# Patient Record
Sex: Female | Born: 1995 | Hispanic: Yes | Marital: Single | Smoking: Never smoker
Health system: Southern US, Community
[De-identification: ages and names within clinical notes are randomized; demographics above are authoritative.]

## PROBLEM LIST (undated history)

## (undated) ENCOUNTER — Inpatient Hospital Stay: Payer: Self-pay

## (undated) DIAGNOSIS — K8020 Calculus of gallbladder without cholecystitis without obstruction: Secondary | ICD-10-CM

## (undated) DIAGNOSIS — N390 Urinary tract infection, site not specified: Secondary | ICD-10-CM

## (undated) DIAGNOSIS — D649 Anemia, unspecified: Secondary | ICD-10-CM

## (undated) DIAGNOSIS — R87629 Unspecified abnormal cytological findings in specimens from vagina: Secondary | ICD-10-CM

## (undated) HISTORY — DX: Urinary tract infection, site not specified: N39.0

## (undated) HISTORY — DX: Unspecified abnormal cytological findings in specimens from vagina: R87.629

## (undated) HISTORY — PX: NO PAST SURGERIES: SHX2092

## (undated) HISTORY — PX: CHOLECYSTECTOMY: SHX55

## (undated) HISTORY — DX: Anemia, unspecified: D64.9

---
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
## 2016-01-10 ENCOUNTER — Ambulatory Visit: Payer: Self-pay

## 2016-01-12 ENCOUNTER — Ambulatory Visit
Admission: RE | Admit: 2016-01-12 | Discharge: 2016-01-12 | Disposition: A | Discharge status: Home / Self Care | Payer: Self-pay | Source: Ambulatory Visit | Attending: Family Medicine | Admitting: Family Medicine

## 2016-01-12 DIAGNOSIS — Z3402 Encounter for supervision of normal first pregnancy, second trimester: Secondary | ICD-10-CM | POA: Insufficient documentation

## 2016-01-12 DIAGNOSIS — Z3A18 18 weeks gestation of pregnancy: Secondary | ICD-10-CM | POA: Insufficient documentation

## 2016-04-01 ENCOUNTER — Observation Stay
Admission: EM | Admit: 2016-04-01 | Discharge: 2016-04-01 | Disposition: A | Discharge status: Home / Self Care | Payer: Medicaid Other | Attending: Obstetrics and Gynecology | Admitting: Obstetrics and Gynecology

## 2016-04-01 DIAGNOSIS — M549 Dorsalgia, unspecified: Secondary | ICD-10-CM | POA: Insufficient documentation

## 2016-04-01 DIAGNOSIS — Z3A30 30 weeks gestation of pregnancy: Secondary | ICD-10-CM | POA: Insufficient documentation

## 2016-04-01 DIAGNOSIS — R109 Unspecified abdominal pain: Secondary | ICD-10-CM

## 2016-04-01 DIAGNOSIS — O26893 Other specified pregnancy related conditions, third trimester: Principal | ICD-10-CM | POA: Insufficient documentation

## 2016-04-01 DIAGNOSIS — O26899 Other specified pregnancy related conditions, unspecified trimester: Secondary | ICD-10-CM

## 2016-04-01 LAB — CHLAMYDIA/NGC RT PCR (ARMC ONLY)
CHLAMYDIA TR: NOT DETECTED
N GONORRHOEAE: NOT DETECTED

## 2016-04-01 LAB — URINALYSIS COMPLETE WITH MICROSCOPIC (ARMC ONLY)
BILIRUBIN URINE: NEGATIVE
GLUCOSE, UA: NEGATIVE mg/dL
HGB URINE DIPSTICK: NEGATIVE
KETONES UR: NEGATIVE mg/dL
NITRITE: NEGATIVE
PH: 7 (ref 5.0–8.0)
Protein, ur: NEGATIVE mg/dL
SPECIFIC GRAVITY, URINE: 1.015 (ref 1.005–1.030)

## 2016-04-01 NOTE — Progress Notes (Signed)
Patient ID: Pamela SlickerIris Acuna Villamar, female   DOB: 11/27/1995, 20 y.o.   MRN: [id number] [name] 11/27/1995 G2 P1 1651w4d based on LMP  presents for pelvic pressure and yellow d/c . Possible LOF   , no vaginal bleeding ,pt with LBP . She sustained a fall to her back 30 days ago and it continues to bother her . She has not been seen by [facility] for this . Good daily fetal movt One prior delivery at 37 week ( induced for IUGR )  O;BP 102/60 (BP Location: Left Arm)   Pulse 94   Temp 98.1 F (36.7 C) (Oral)   Resp 18   Ht 4\' 11"  (1.499 m)   Wt 126 lb (57.2 kg)   BMI 25.45 kg/m  ABDsoft NT  CX closed , neg nitrazine  By RN  NST reactive 150 + accels , no decels  Labs: ua  A: lower back pain , M/S injury from one month ago . No evidence of labor  P:d/c home with precautions  Cont fetal kick counts  Pt should make appt with [facility] this week  RTC if worse

## 2016-04-01 NOTE — Discharge Summary (Signed)
  Patient ID: [name], female   DOB: 11/27/1995, 20 y.o.   MRN: [id number] Pamela Cole 11/27/1995 G2 P1 1651w4d based on LMP  presents for pelvic pressure and yellow d/c . Possible LOF   , no vaginal bleeding ,pt with LBP . She sustained a fall to her back 30 days ago and it continues to bother her . She has not been seen by [facility] for this . Good daily fetal movt One prior delivery at 37 week ( induced for IUGR )  O;BP 102/60 (BP Location: Left Arm)   Pulse 94   Temp 98.1 F (36.7 C) (Oral)   Resp 18   Ht 4\' 11"  (1.499 m)   Wt 126 lb (57.2 kg)   BMI 25.45 kg/m  ABDsoft NT  CX closed , neg nitrazine  By RN  NST reactive 150 + accels , no decels  Labs: ua  A: lower back pain , M/S injury from one month ago . No evidence of labor  P:d/c home with precautions  Cont fetal kick counts  Pt should make appt with [facility] this week  RTC if worse

## 2016-04-01 NOTE — OB Triage Note (Signed)
Patient came in for observation for lower abdominal and back pain since Wednesday. Patient rates pain 7 out of 10. Patient denies uterine contractions.  Patient denies leaking of fluid, but complains of yellow vaginal discharge. Patient reports feeling the baby move fine. Vital signs stable and patient afebrile. FHR baseline 145 with moderate variability with accelerations 15 x 15 and no decelerations. Family at bedside. Will continue to monitor.

## 2016-04-03 LAB — URINE CULTURE: CULTURE: NO GROWTH

## 2016-04-07 ENCOUNTER — Other Ambulatory Visit: Payer: Self-pay | Admitting: Advanced Practice Midwife

## 2016-04-07 DIAGNOSIS — Z3483 Encounter for supervision of other normal pregnancy, third trimester: Secondary | ICD-10-CM

## 2016-04-13 ENCOUNTER — Encounter: Payer: Self-pay | Admitting: *Deleted

## 2016-04-13 ENCOUNTER — Other Ambulatory Visit: Payer: Self-pay | Admitting: Advanced Practice Midwife

## 2016-04-13 ENCOUNTER — Ambulatory Visit
Admission: RE | Admit: 2016-04-13 | Discharge: 2016-04-13 | Disposition: A | Discharge status: Home / Self Care | Payer: Self-pay | Source: Ambulatory Visit | Attending: Advanced Practice Midwife | Admitting: Advanced Practice Midwife

## 2016-04-13 ENCOUNTER — Inpatient Hospital Stay
Admission: EM | Admit: 2016-04-13 | Discharge: 2016-04-13 | Disposition: A | Discharge status: Home / Self Care | Payer: Self-pay | Attending: Obstetrics and Gynecology | Admitting: Obstetrics and Gynecology

## 2016-04-13 DIAGNOSIS — Z3A31 31 weeks gestation of pregnancy: Secondary | ICD-10-CM | POA: Insufficient documentation

## 2016-04-13 DIAGNOSIS — O42913 Preterm premature rupture of membranes, unspecified as to length of time between rupture and onset of labor, third trimester: Secondary | ICD-10-CM | POA: Insufficient documentation

## 2016-04-13 DIAGNOSIS — O365931 Maternal care for other known or suspected poor fetal growth, third trimester, fetus 1: Secondary | ICD-10-CM

## 2016-04-13 DIAGNOSIS — Z3483 Encounter for supervision of other normal pregnancy, third trimester: Secondary | ICD-10-CM | POA: Insufficient documentation

## 2016-04-13 LAB — URINALYSIS COMPLETE WITH MICROSCOPIC (ARMC ONLY)
BILIRUBIN URINE: NEGATIVE
GLUCOSE, UA: NEGATIVE mg/dL
HGB URINE DIPSTICK: NEGATIVE
Ketones, ur: NEGATIVE mg/dL
Nitrite: NEGATIVE
PH: 8 (ref 5.0–8.0)
Protein, ur: NEGATIVE mg/dL
SPECIFIC GRAVITY, URINE: 1.012 (ref 1.005–1.030)

## 2016-04-13 LAB — CHLAMYDIA/NGC RT PCR (ARMC ONLY)
CHLAMYDIA TR: NOT DETECTED
N GONORRHOEAE: NOT DETECTED

## 2016-04-13 LAB — WET PREP, GENITAL
Clue Cells Wet Prep HPF POC: NONE SEEN
SPERM: NONE SEEN
TRICH WET PREP: NONE SEEN
Yeast Wet Prep HPF POC: NONE SEEN

## 2016-04-13 NOTE — OB Triage Note (Signed)
Pt discharged in stable condition. Pt aware of follow up appointments to be made in AM. Pt verbalized understanding of discharge instructions.

## 2016-04-13 NOTE — Discharge Instructions (Signed)
fLABOR: When contractions begin, you should start to time them from the beginning of one contraction to the beginning of the next.  When contractions are 5-10 minutes apart or less and have been regular for at least an hour, you should call your health care provider.  Notify your doctor if any of the following occur: 1. Bleeding from the vagina 7. Sudden, constant, or occasional abdominal pain  2. Pain or burning when urinating 8. Sudden gushing of fluid from the vagina (with or without continued leaking)  3. Chills or fever 9. Fainting spells, "black outs" or loss of consciousness  4. Increase in vaginal discharge 10. Severe or continued nausea or vomiting  5. Pelvic pressure (sudden increase) 11. Blurring of vision or spots before the eyes  6. Baby moving less than usual 12. Leaking of fluid    FETAL KICK COUNT: Lie on your left side for one hour after a meal, and count the number of times your baby kicks. If it is less than 5 times, get up, move around and drink some juice. Repeat the test 30 minutes later. If it is still less than 5 kicks in an hour, notify your doctor.

## 2016-04-13 NOTE — Final Progress Note (Signed)
TRIAGE VISIT with NST   Pamela Cole is a 20 y.o. G2P1001. She is at [redacted]w[redacted]d gestation. She was sent from [facility] for a growth scan per patient for S<D. She reports first pregnancy IOL for IUGR at 36-37wks. We do not have prenatal records after 5/17 in the system.  Indication: S<D  S: Resting comfortably. no CTX, no VB. Active fetal movement. Concerned about suprapubic pain. O:  BP 118/66 (BP Location: Left Arm)   Pulse 100   Temp 98.3 F (36.8 C) (Oral)   Resp 17  No results found for this or any previous visit (from the past 48 hour(s)).   Gen: NAD, AAOx3      Abd: FNTTP      Ext: Non-tender, Nonedmeatous    FHT: 140, mod var, +accels no decels TOCO: quiet SVE: Dilation: 1 Effacement (%): Thick Station: -3 Exam by:: Kavaughn Faucett, MD   Ultrasound: OBSTETRICAL ULTRASOUND >14 WKS  FINDINGS: Number of Fetuses: 1  Heart Rate:  135 bpm  Movement: Present  Presentation: Cephalic  Previa: No  Placental Location: Posterior  Amniotic Fluid (Subjective): Decreased.  Amniotic Fluid (Objective):  AFI 7.9 cm (5%ile= 8.6 cm, 95%= 24.2 cm for 32 wks)  FETAL BIOMETRY  BPD:  7.9cm 31w 6d  HC:    28.9cm  31w   6d  AC:   27.5cm  31w   4d  FL:   6.2cm  31w   6d  Current Mean GA: 31w 6d              US EDC: 06/09/2016  Estimated Fetal Weight:  1,829g    24%ile  Maternal Findings:  Cervix:  3.4 cm and closed.    A/P:  20 y.o. G2P1 [redacted]w[redacted]d with S<D, low normal amniotic fluid, leaking white fluid x4 weeks.   Labor: not present. Cervix:  3.4 cm and closed. Occasional contractions on montior, not felt  R/o ROM: SSE negative x 3. Beside wet prep neg for ferning, pseudohyphae, clue cells, trichomoads. Formal Wet prep sent. Normal amount of white discharge. =.   Fetal Wellbeing: Reassuring Cat 1 tracing.  Given normal AFI and reactive NST (see above) modified BPP reassuring.   Plan for repeat AFI in 1 week.  Will need another growth scan in 4 weeks for fetal  growth. Is in 24% today.  Suprapubic pain: UA pending. No vaginal infection. Will test of GC/CT as well.  D/c home stable, precautions reviewed, follow-up as scheduled.    

## 2016-04-13 NOTE — OB Triage Provider Note (Signed)
TRIAGE VISIT with NST   Pamela Cole is a 20 y.o. G2P1001. She is at 2724w3d gestation. She was sent from [facility] for a growth scan per patient for S<D. She reports first pregnancy IOL for IUGR at 36-37wks. We do not have prenatal records after 5/17 in the system.  Indication: S<D  S: Resting comfortably. no CTX, no VB. Active fetal movement. Concerned about suprapubic pain. O:  BP 118/66 (BP Location: Left Arm)   Pulse 100   Temp 98.3 F (36.8 C) (Oral)   Resp 17  No results found for this or any previous visit (from the past 48 hour(s)).   Gen: NAD, AAOx3      Abd: FNTTP      Ext: Non-tender, Nonedmeatous    FHT: 140, mod var, +accels no decels TOCO: quiet SVE: Dilation: 1 Effacement (%): Thick Station: -3 Exam by:: Dalbert GarnetBeasley, MD   Ultrasound: OBSTETRICAL ULTRASOUND >14 WKS  FINDINGS: Number of Fetuses: 1  Heart Rate:  135 bpm  Movement: Present  Presentation: Cephalic  Previa: No  Placental Location: Posterior  Amniotic Fluid (Subjective): Decreased.  Amniotic Fluid (Objective):  AFI 7.9 cm (5%ile= 8.6 cm, 95%= 24.2 cm for 32 wks)  FETAL BIOMETRY  BPD:  7.9cm 31w 6d  HC:    28.9cm  31w   6d  AC:   27.5cm  31w   4d  FL:   6.2cm  31w   6d  Current Mean GA: 31w 6d              US EDC: 06/09/2016  Estimated Fetal Weight:  1,829g    24%ile  Maternal Findings:  Cervix:  3.4 cm and closed.    A/P:  20 y.o. G2P1 9824w3d with S<D, low normal amniotic fluid, leaking white fluid x4 weeks.   Labor: not present. Cervix:  3.4 cm and closed. Occasional contractions on montior, not felt  R/o ROM: SSE negative x 3. Beside wet prep neg for ferning, pseudohyphae, clue cells, trichomoads. Formal Wet prep sent. Normal amount of white discharge. =.   Fetal Wellbeing: Reassuring Cat 1 tracing.  Given normal AFI and reactive NST (see above) modified BPP reassuring.   Plan for repeat AFI in 1 week.  Will need another growth scan in 4 weeks for fetal  growth. Is in 24% today.  Suprapubic pain: UA pending. No vaginal infection. Will test of GC/CT as well.  D/c home stable, precautions reviewed, follow-up as scheduled.

## 2016-04-17 ENCOUNTER — Other Ambulatory Visit (HOSPITAL_COMMUNITY): Payer: Self-pay | Admitting: Family Medicine

## 2016-04-17 ENCOUNTER — Observation Stay
Admission: EM | Admit: 2016-04-17 | Discharge: 2016-04-17 | Disposition: A | Discharge status: Home / Self Care | Payer: Self-pay | Attending: Obstetrics and Gynecology | Admitting: Obstetrics and Gynecology

## 2016-04-17 DIAGNOSIS — Z3483 Encounter for supervision of other normal pregnancy, third trimester: Secondary | ICD-10-CM

## 2016-04-17 DIAGNOSIS — O26893 Other specified pregnancy related conditions, third trimester: Principal | ICD-10-CM | POA: Insufficient documentation

## 2016-04-17 DIAGNOSIS — R102 Pelvic and perineal pain: Secondary | ICD-10-CM | POA: Insufficient documentation

## 2016-04-17 DIAGNOSIS — Z3A32 32 weeks gestation of pregnancy: Secondary | ICD-10-CM | POA: Insufficient documentation

## 2016-04-17 DIAGNOSIS — R05 Cough: Secondary | ICD-10-CM | POA: Insufficient documentation

## 2016-04-17 DIAGNOSIS — O99013 Anemia complicating pregnancy, third trimester: Secondary | ICD-10-CM | POA: Insufficient documentation

## 2016-04-17 LAB — URINALYSIS COMPLETE WITH MICROSCOPIC (ARMC ONLY)
Bacteria, UA: NONE SEEN
Bilirubin Urine: NEGATIVE
GLUCOSE, UA: NEGATIVE mg/dL
Hgb urine dipstick: NEGATIVE
KETONES UR: NEGATIVE mg/dL
Nitrite: NEGATIVE
Protein, ur: NEGATIVE mg/dL
SPECIFIC GRAVITY, URINE: 1.012 (ref 1.005–1.030)
pH: 7 (ref 5.0–8.0)

## 2016-04-17 NOTE — OB Triage Note (Addendum)
Ms. Pamela Cole here with complaint of back pain, lower abdominal pressure and increased discharge. Reports decreased fetal movement, pain that started last night. Denies bleeding.  Pt also reports persistent cough that she states has been present for 3 weeks, lungs clear to ascultation. Pt has not taken anything for cough. Pt reports occasional headaches that are accompanied by blurred vision and seeing spots of light, takes tylenol but reports they usually have to go away on their own.

## 2016-04-17 NOTE — Discharge Summary (Signed)
  Patient ID: [name], female   DOB: February 12, 1996, 20 y.o.   MRN: [id number] Pamela Cole February 12, 1996 G2 P1 1559w0d presents for lower pelvic pressure for 2 weeks . Intermittent . No prior h/o PTL .  noLOF , no vaginal bleeding ,pt with adry cough for 4 weeks . No recent travel . She states she has a neg PPD  O;BP (!) 96/59 (BP Location: Right Arm)   Pulse 100   Temp 98.7 F (37.1 C) (Oral)   Resp 16   SpO2 100%  ABDsoft NT  Lungs CTA  CX ext os opened 1 cm , internal os closed  vtx  NST reactive 150 , no ctx , no decel  Labs: ua pending  A: low back / pressure pressure without evidence of ctx/ ptl  Non productive cough  P:d/c home  Next appt with [facility] tomorrow . Rtc for increasing pelvic pressure  Robitussin DM  ua + culture pending

## 2016-04-17 NOTE — Progress Notes (Signed)
[facility]  Telephone:(336) [phone] Fax:(336) [phone]  ID: [name] OB: 1995/11/09  MR#: [id number]  QIO#:[id number]#:[id number]  Patient Care Team: [facility] as [facility] - General  CHIEF COMPLAINT: Iron deficiency anemia affecting the third trimester of pregnancy.  INTERVAL HISTORY: Patient is a 20 year old female in the third trimester of her second pregnancy who was found to have a significantly decreased hemoglobin on routine blood work. Patient was also noted to have significant iron deficiency anemia with her first pregnancy in 2015. She currently feels well and is asymptomatic. She does not complain of weakness or fatigue. She has no neurologic complaints. She denies any recent fevers or illnesses. She has a good appetite and is gaining weight appropriately. She has no chest pain or shortness of breath. She denies any nausea, vomiting, constipation, or diarrhea. She has no urinary complaints. Patient feels at her baseline and offers no specific complaints today.  REVIEW OF SYSTEMS:   Review of Systems  Constitutional: Negative.  Negative for fever, malaise/fatigue and weight loss.  Respiratory: Negative.  Negative for cough and shortness of breath.   Cardiovascular: Negative.  Negative for leg swelling.  Gastrointestinal: Negative.  Negative for abdominal pain, blood in stool and melena.  Genitourinary: Negative.   Musculoskeletal: Negative.   Neurological: Negative.  Negative for weakness.  Psychiatric/Behavioral: Negative.     As per HPI. Otherwise, a complete review of systems is negative.  PAST MEDICAL HISTORY: Past Medical History:  Diagnosis Date  . Anemia     PAST SURGICAL HISTORY: History reviewed. No pertinent surgical history.  FAMILY HISTORY: Reviewed and unchanged. No reported history of malignancy or chronic disease.  ADVANCED DIRECTIVES (Y/N):  N  HEALTH MAINTENANCE: Social History  Substance Use Topics  . Smoking status:  Never Smoker  . Smokeless tobacco: Never Used  . Alcohol use No     Colonoscopy:  PAP:  Bone density:  Lipid panel:  No Known Allergies  No current outpatient prescriptions on file.   No current facility-administered medications for this visit.     OBJECTIVE: Vitals:   04/18/16 1037  BP: 103/67  Pulse: (!) 111  Temp: (!) 96.4 F (35.8 C)     Body mass index is 26.63 kg/m.    ECOG FS:0 - Asymptomatic  General: Well-developed, well-nourished, no acute distress. Eyes: Pink conjunctiva, anicteric sclera. HEENT: Normocephalic, moist mucous membranes, clear oropharnyx. Lungs: Clear to auscultation bilaterally. Heart: Regular rate and rhythm. No rubs, murmurs, or gallops. Abdomen: Appears appropriate for gestational age.  Musculoskeletal: No edema, cyanosis, or clubbing. Neuro: Alert, answering all questions appropriately. Cranial nerves grossly intact. Skin: No rashes or petechiae noted. Psych: Normal affect. Lymphatics: No cervical, calvicular, axillary or inguinal LAD.   LAB RESULTS:  Lab Results  Component Value Date   NA 135 12/20/2015   K 3.3 (L) 12/20/2015   CL 103 12/20/2015   CO2 23 12/20/2015   GLUCOSE 81 12/20/2015   BUN <5 (L) 12/20/2015   CREATININE 0.37 (L) 12/20/2015   CALCIUM 8.9 12/20/2015   PROT 7.2 12/20/2015   ALBUMIN 3.8 12/20/2015   AST 18 12/20/2015   ALT 12 (L) 12/20/2015   ALKPHOS 60 12/20/2015   BILITOT 0.4 12/20/2015   GFRNONAA >60 12/20/2015   GFRAA >60 12/20/2015    Lab Results  Component Value Date   WBC 11.7 (H) 04/18/2016   NEUTROABS 7.7 (H) 08/21/2013   HGB 8.5 (L) 04/18/2016   HCT 27.2 (L) 04/18/2016   MCV 61.6 (L) 04/18/2016  PLT 284 04/18/2016     STUDIES: US Ob Follow Up  Result Date: 04/13/2016 CLINICAL DATA:  Pregnancy.  Small for dates. EXAM: OBSTETRICAL ULTRASOUND >14 WKS FINDINGS: Number of Fetuses: 1 Heart Rate:  135 bpm Movement: Present Presentation: Cephalic Previa: No Placental Location: Posterior  Amniotic Fluid (Subjective): Decreased. Amniotic Fluid (Objective): AFI 7.9 cm (5%ile= 8.6 cm, 95%= 24.2 cm for 32 wks) FETAL BIOMETRY BPD:  7.9cm 31w 6d HC:    28.9cm  31w   6d AC:   27.5cm  31w   4d FL:   6.2cm  31w   6d Current Mean GA: 31w 6d              [place] EDC: 06/09/2016 Estimated Fetal Weight:  1,829g    24%ile FETAL ANATOMY Lateral Ventricles: Visualized visualized Thalami/CSP: Visualized Posterior Fossa:  Visualized Nuchal Region: Previously visualized Upper Lip: Previously visualized Spine: Visualized 4 Chamber Heart on Left: Visualized LVOT: Previously visualized RVOT: Previously visualized Stomach on Left: Visualized 3 Vessel Cord: Previously visualized Cord Insertion site: Previously visualized Kidneys: Visualized Bladder: Visualized Extremities: Previously visualized Technically difficult due to: Decreased amniotic fluid. Maternal Findings: Cervix:  3.4 cm and closed. IMPRESSION: 1. Decreased amniotic fluid. A process such as premature rupture of membranes cannot be excluded. This report was phoned to the patient's caregiver at the time the study. 2. Single viable intrauterine pregnancy at 31 weeks 6 days in cephalic presentation. Fetal heart rate 135 beats per minute. Clinical correlation suggested. Electronically Signed   By: Maisie Fus  Register   On: 04/13/2016 15:50    ASSESSMENT: Iron deficiency anemia affecting the third trimester of pregnancy.  PLAN:    1.  Iron deficiency anemia affecting the third trimester of pregnancy: Patient's hemoglobin is significantly decreased. Iron stores, B 12, and folate are pending at time of dictation. Given patient's significantly decreased MCV, have also ordered hemoglobin electrophoresis is also pending. Return to clinic in 1 and 2 weeks to receive 510 mg IV Feraheme. Patient will then return to clinic at the end of October 1 week prior to her due date for repeat laboratory work, further evaluation, and consideration of additional IV iron. 2. Pregnancy:  Continue follow-up with OB as scheduled. Patient's due date is June 06, 2016.  Patient expressed understanding and was in agreement with this plan. She also understands that She can call clinic at any time with any questions, concerns, or complaints.   Jeralyn Ruths, MD   04/18/2016 12:15 PM

## 2016-04-18 ENCOUNTER — Other Ambulatory Visit: Payer: Self-pay

## 2016-04-18 ENCOUNTER — Inpatient Hospital Stay: Payer: Self-pay

## 2016-04-18 ENCOUNTER — Inpatient Hospital Stay: Payer: Self-pay | Attending: Oncology | Admitting: Oncology

## 2016-04-18 ENCOUNTER — Encounter: Payer: Self-pay | Admitting: Oncology

## 2016-04-18 VITALS — BP 103/67 | HR 111 | Temp 96.4°F | Wt 131.8 lb

## 2016-04-18 DIAGNOSIS — O99013 Anemia complicating pregnancy, third trimester: Secondary | ICD-10-CM | POA: Insufficient documentation

## 2016-04-18 DIAGNOSIS — Z79899 Other long term (current) drug therapy: Secondary | ICD-10-CM | POA: Insufficient documentation

## 2016-04-18 LAB — CBC
HCT: 27.2 % — ABNORMAL LOW (ref 35.0–47.0)
Hemoglobin: 8.5 g/dL — ABNORMAL LOW (ref 12.0–16.0)
MCH: 19.3 pg — ABNORMAL LOW (ref 26.0–34.0)
MCHC: 31.3 g/dL — ABNORMAL LOW (ref 32.0–36.0)
MCV: 61.6 fL — AB (ref 80.0–100.0)
PLATELETS: 284 10*3/uL (ref 150–440)
RBC: 4.42 MIL/uL (ref 3.80–5.20)
RDW: 17.5 % — AB (ref 11.5–14.5)
WBC: 11.7 10*3/uL — AB (ref 3.6–11.0)

## 2016-04-18 LAB — IRON AND TIBC
IRON: 14 ug/dL — AB (ref 28–170)
SATURATION RATIOS: 2 % — AB (ref 10.4–31.8)
TIBC: 658 ug/dL — AB (ref 250–450)
UIBC: 644 ug/dL

## 2016-04-18 LAB — FOLATE: Folate: 15.6 ng/mL (ref >5.9)

## 2016-04-18 LAB — DAT, POLYSPECIFIC AHG (ARMC ONLY): Polyspecific AHG test: NEGATIVE

## 2016-04-18 LAB — VITAMIN B12: Vitamin B-12: 266 pg/mL (ref 180–914)

## 2016-04-19 LAB — URINE CULTURE
CULTURE: NO GROWTH
SPECIAL REQUESTS: NORMAL

## 2016-04-19 LAB — HEMOGLOBINOPATHY EVALUATION
HGB A: 97.8 % (ref 94.0–98.0)
HGB C: 0 %
HGB F QUANT: 0 % (ref 0.0–2.0)
HGB S QUANTITAION: 0 %
Hgb A2 Quant: 2.2 % (ref 0.7–3.1)

## 2016-04-20 ENCOUNTER — Other Ambulatory Visit: Payer: Self-pay | Admitting: Oncology

## 2016-04-20 ENCOUNTER — Ambulatory Visit
Admission: RE | Admit: 2016-04-20 | Discharge: 2016-04-20 | Disposition: A | Discharge status: Home / Self Care | Payer: Self-pay | Source: Ambulatory Visit | Attending: Family Medicine | Admitting: Family Medicine

## 2016-04-20 ENCOUNTER — Ambulatory Visit: Payer: Self-pay

## 2016-04-20 DIAGNOSIS — Z36 Encounter for antenatal screening of mother: Secondary | ICD-10-CM | POA: Insufficient documentation

## 2016-04-20 DIAGNOSIS — Z3A32 32 weeks gestation of pregnancy: Secondary | ICD-10-CM | POA: Insufficient documentation

## 2016-04-20 DIAGNOSIS — Z3483 Encounter for supervision of other normal pregnancy, third trimester: Secondary | ICD-10-CM | POA: Insufficient documentation

## 2016-04-21 ENCOUNTER — Inpatient Hospital Stay: Payer: Self-pay

## 2016-04-27 ENCOUNTER — Ambulatory Visit: Payer: Self-pay

## 2016-04-28 ENCOUNTER — Inpatient Hospital Stay: Payer: Self-pay

## 2016-04-28 ENCOUNTER — Observation Stay
Admission: EM | Admit: 2016-04-28 | Discharge: 2016-04-28 | Disposition: A | Discharge status: Home / Self Care | Payer: Self-pay | Attending: Obstetrics & Gynecology | Admitting: Obstetrics & Gynecology

## 2016-04-28 DIAGNOSIS — O26893 Other specified pregnancy related conditions, third trimester: Secondary | ICD-10-CM | POA: Insufficient documentation

## 2016-04-28 DIAGNOSIS — Z3A34 34 weeks gestation of pregnancy: Principal | ICD-10-CM | POA: Insufficient documentation

## 2016-04-28 LAB — URINALYSIS COMPLETE WITH MICROSCOPIC (ARMC ONLY)
BACTERIA UA: NONE SEEN
Bilirubin Urine: NEGATIVE
GLUCOSE, UA: NEGATIVE mg/dL
Hgb urine dipstick: NEGATIVE
Ketones, ur: NEGATIVE mg/dL
Nitrite: NEGATIVE
PROTEIN: NEGATIVE mg/dL
SPECIFIC GRAVITY, URINE: 1.016 (ref 1.005–1.030)
pH: 7 (ref 5.0–8.0)

## 2016-04-28 LAB — WET PREP, GENITAL
CLUE CELLS WET PREP: NONE SEEN
Sperm: NONE SEEN
TRICH WET PREP: NONE SEEN
YEAST WET PREP: NONE SEEN

## 2016-04-28 NOTE — OB Triage Note (Signed)
Presents with complaint of lower abd pressure that started this morning and has gotten worse. Pt states some fluid leaking but it has been happening for a couple of months. Denies bleeding. Having some nausea. No vomiting.  Denies any burning with urination but states more frequency.

## 2016-04-28 NOTE — Discharge Summary (Signed)
Pt d/c'd to home in stable condition. D/c instructions given, understanding verbalized.

## 2016-04-28 NOTE — Progress Notes (Signed)
TRIAGE VISIT with NST HPI:  Pamela Cole is a 20 y.o. G2P1. She is at 7559w3d gestation, presenting with c/o of lower abdominal and pelvic pressure.  She was seen today at [facility], and was sent from the office for evaluation.  She also reports sharp vaginal pains and pubic bone pain. She also reports intermittent nausea this week.  She also reports leaking watery fluid for 1 week.  She endorses good fetal movement.  She denies vaginal bleeding, abnormal vaginal discharge, dysuria, c/d, heartburn, HA, visual changes, CP, SOB.  She rates her pain 7/10, and states Tylenol doesn't help.   Prenatal hx:  2015: NSVD - IOL at 37+5 weeks for IUGR <8%  Current Prenatal Active Problems: anemia - hematology consult placed on 04/07/16, persistent proteinuria, hx of UTI, S<D monitoring growth US   04/20/16: AFI 10.1/ vtx/posterior placenta/cervix appears closed 04/13/16: AFI 7.9cm/ vtx/EFW 1829 grams @24 %/cervix 3.4 cm/closed   O:  Temp 98.7 F (37.1 C) (Oral)   LMP 08/31/2015  Results for orders placed or performed during the hospital encounter of 04/28/16 (from the past 48 hour(s))  Urinalysis complete, with microscopic ([facility] only)   Collection Time: 04/28/16  2:54 PM  Result Value Ref Range   Color, Urine YELLOW (A) YELLOW   APPearance CLEAR (A) CLEAR   Glucose, UA NEGATIVE NEGATIVE mg/dL   Bilirubin Urine NEGATIVE NEGATIVE   Ketones, ur NEGATIVE NEGATIVE mg/dL   Specific Gravity, Urine 1.016 1.005 - 1.030   Hgb urine dipstick NEGATIVE NEGATIVE   pH 7.0 5.0 - 8.0   Protein, ur NEGATIVE NEGATIVE mg/dL   Nitrite NEGATIVE NEGATIVE   Leukocytes, UA 1+ (A) NEGATIVE   RBC / HPF 0-5 0 - 5 RBC/hpf   WBC, UA 0-5 0 - 5 WBC/hpf   Bacteria, UA NONE SEEN NONE SEEN   Squamous Epithelial / LPF 0-5 (A) NONE SEEN   Mucous PRESENT   Wet prep, genital   Collection Time: 04/28/16  4:23 PM  Result Value Ref Range   Yeast Wet Prep HPF POC NONE SEEN NONE SEEN   Trich, Wet Prep NONE SEEN NONE SEEN   Clue Cells Wet Prep  HPF POC NONE SEEN NONE SEEN   WBC, Wet Prep HPF POC MANY (A) NONE SEEN   Sperm NONE SEEN     Fern: Negative performed by me  Negative Nitrazine  Gen: NAD, AAOx3      Abd: FNTTP      Ext: Non-tender, Nonedmeatous    NST/FHT: Baseline: 145 bpm/ moderate variability/ +accels/ no decels  TOCO: quiet NWG:[id number]:Dilation: Closed (internal os 1cm.) Exam by:: Tarvares Lant  Fetal head at -1 station   NST: Reactive. See FHT above for particulars.  A/P:  20 y.o. G2P1 3459w3d with lower abdominal and pelvic pressure.   Labor: not present.   R/o ROM: Fern negative. Wet prep sent. Normal amount of white discharge. Nitrazine negative  Pelvic pressure in pregnancy: advised OTC Tylenol, heating pads/ice, belly support band - handouts given, also recommend warm tub soaks or going to an indoor  swimming pool / no hot tubs  Pt. Resting comfortably at time of discharge   Fetal Wellbeing: NST reactive Reassuring Cat 1 tracing.  D/c home stable, precautions reviewed, follow-up as scheduled.   FKC's daily   Preterm labor precautions given   F/U at [facility] on Tuesday 05/02/16, and for US on 05/04/16  Dr. Elesa MassedWard notified and agrees with plan.   Carlean JewsMeredith Nyellie Yetter, CNM

## 2016-04-28 NOTE — Discharge Instructions (Signed)
Your membranes are not ruptured and all of the tests performed were negative.   Drink plenty of fluid, get plenty of rest. Follow up with your scheduled appointment.

## 2016-05-03 ENCOUNTER — Inpatient Hospital Stay: Payer: Self-pay

## 2016-05-03 DIAGNOSIS — O99013 Anemia complicating pregnancy, third trimester: Secondary | ICD-10-CM

## 2016-05-03 MED ORDER — SODIUM CHLORIDE 0.9 % IV SOLN
510.0000 mg | Freq: Once | INTRAVENOUS | Status: AC
  Administered 2016-05-03: 510 mg via INTRAVENOUS
  Filled 2016-05-03: qty 17

## 2016-05-03 MED ORDER — SODIUM CHLORIDE 0.9 % IV SOLN
Freq: Once | INTRAVENOUS | Status: AC
  Administered 2016-05-03: 12:00:00 via INTRAVENOUS
  Filled 2016-05-03: qty 1000

## 2016-05-04 ENCOUNTER — Ambulatory Visit
Admission: RE | Admit: 2016-05-04 | Discharge: 2016-05-04 | Disposition: A | Discharge status: Home / Self Care | Payer: Self-pay | Source: Ambulatory Visit | Attending: Family Medicine | Admitting: Family Medicine

## 2016-05-04 DIAGNOSIS — Z3483 Encounter for supervision of other normal pregnancy, third trimester: Secondary | ICD-10-CM

## 2016-05-04 DIAGNOSIS — Z36 Encounter for antenatal screening of mother: Secondary | ICD-10-CM | POA: Insufficient documentation

## 2016-05-04 DIAGNOSIS — Z3A35 35 weeks gestation of pregnancy: Secondary | ICD-10-CM | POA: Insufficient documentation

## 2016-05-09 LAB — OB RESULTS CONSOLE GC/CHLAMYDIA
CHLAMYDIA, DNA PROBE: NEGATIVE
GC PROBE AMP, GENITAL: NEGATIVE

## 2016-05-09 LAB — OB RESULTS CONSOLE GBS: GBS: POSITIVE

## 2016-05-10 ENCOUNTER — Inpatient Hospital Stay: Payer: Self-pay | Attending: Oncology

## 2016-05-10 VITALS — BP 95/66 | HR 112 | Temp 97.5°F | Resp 18

## 2016-05-10 DIAGNOSIS — O99013 Anemia complicating pregnancy, third trimester: Secondary | ICD-10-CM | POA: Insufficient documentation

## 2016-05-10 DIAGNOSIS — D509 Iron deficiency anemia, unspecified: Secondary | ICD-10-CM | POA: Insufficient documentation

## 2016-05-10 DIAGNOSIS — Z79899 Other long term (current) drug therapy: Secondary | ICD-10-CM | POA: Insufficient documentation

## 2016-05-10 MED ORDER — SODIUM CHLORIDE 0.9 % IV SOLN
510.0000 mg | Freq: Once | INTRAVENOUS | Status: AC
  Administered 2016-05-10: 510 mg via INTRAVENOUS
  Filled 2016-05-10: qty 17

## 2016-05-10 MED ORDER — SODIUM CHLORIDE 0.9 % IV SOLN
Freq: Once | INTRAVENOUS | Status: AC
  Administered 2016-05-10: 10:00:00 via INTRAVENOUS
  Filled 2016-05-10: qty 1000

## 2016-05-18 ENCOUNTER — Inpatient Hospital Stay: Payer: Self-pay

## 2016-05-18 ENCOUNTER — Inpatient Hospital Stay
Admission: EM | Admit: 2016-05-18 | Discharge: 2016-05-18 | Disposition: A | Discharge status: Home / Self Care | Payer: Self-pay | Attending: Obstetrics and Gynecology | Admitting: Obstetrics and Gynecology

## 2016-05-18 ENCOUNTER — Encounter: Payer: Self-pay | Admitting: *Deleted

## 2016-05-18 DIAGNOSIS — Z364 Encounter for antenatal screening for fetal growth retardation: Secondary | ICD-10-CM | POA: Insufficient documentation

## 2016-05-18 MED ORDER — ACETAMINOPHEN 325 MG PO TABS: 650.0000 mg | ORAL_TABLET | ORAL | Status: DC | PRN

## 2016-05-18 MED ORDER — CALCIUM CARBONATE ANTACID 500 MG PO CHEW: 2.0000 | CHEWABLE_TABLET | ORAL | Status: DC | PRN

## 2016-05-18 NOTE — OB Triage Note (Signed)
Seen at [facility] today. Was told to come to hospital to evaluate fetal growth. Pamela HoopsElks, Dandrea Medders S

## 2016-05-18 NOTE — OB Triage Provider Note (Signed)
TRIAGE VISIT with NST   Pamela Cole is a 20 y.o. G2P1. She is at 2460w2d gestation.  Indication: Small for dates  S: Resting comfortably. no CTX, no VB. Active fetal movement.   Sent from prenatal clinic for small for dates and decreased fetal movement per patient report. No clinic records from today present.  CLINICAL DATA:  Current assigned gestational age of [redacted] weeks 2 days. Measuring small for dates.  EXAM: OBSTETRIC 14+ WK ULTRASOUND FOLLOW-UP  COMPARISON:  05/04/2016  FINDINGS: Number of Fetuses: 1  Heart Rate:  135 bpm  Movement: Yes  Presentation: Cephalic  Previa: No  Placental Location: Fundal and left lateral  Amniotic Fluid (Subjective): Lower normal range  Amniotic Fluid (Objective):  AFI 8.6 cm (5%ile= 7.5 cm, 95%= 24.4 cm for 37 wks)  FETAL BIOMETRY  BPD:  9.1cm 37w   0d  HC:    32.7cm  37w   1d  AC:   32.2cm  36w   1d  FL:   7.0cm  36w   0d  Current Mean GA: 36w 4d          US EDC: 06/11/2016  Assigned GA:  37w 2d              Assigned EDC:  06/06/2016  Estimated Fetal Weight: 2,898g 31%ile, compared to 42 %ile on previous exam  FETAL ANATOMY  Lateral Ventricles: Previously seen  Thalami/CSP: Previously seen  Posterior Fossa:  Previously seen  Nuchal region:  Previously seen  Upper Lip: Previously seen  Spine: Visualized  4 Chamber Heart on Left: Appears normal  LVOT: Previously seen  RVOT: Previously seen  Stomach on Left: Appears normal  3 Vessel Cord: Previously seen  Cord Insertion site: Previously seen  Kidneys: Appears normal  Bladder: Appears normal  Extremities: Previously seen  Sex: Female previously seen  Technically difficult due to: Advanced gestational age and fetal position  MATERNAL FINDINGS:  Cervix: Not visualized >34 weeks  IMPRESSION: Assigned gestational age is currently 37 weeks 2 days. Estimated fetal weight is currently at 31  percentile, mildly decreased from 42 percentile on previous study.  Low normal amniotic fluid volume, with AFI measuring 8.6 cm.   O:  BP 106/72 (BP Location: Left Arm)   Pulse (!) 108   Temp 98 F (36.7 C) (Oral)   Resp 16   Ht 4\' 11"  (1.499 m)   Wt 134 lb (60.8 kg)   LMP 08/31/2015   BMI 27.06 kg/m  No results found for this or any previous visit (from the past 48 hour(s)).   Gen: NAD, AAOx3      Abd: FNTTP      Ext: Non-tender, Nonedmeatous    FHT: 145, mod var, +accels, no decels TOCO: quiet SVE:  deferred   A/P:  20 y.o. G2P1 6960w2d with S<D.   Labor: not present.   Normal fetal growth by ultrasound with normal fluid. No indication for early term induction at this time. Because of low normal fluid at 8.6cm, recommend repeat AFI in one week.   Fetal Wellbeing: NST is Reassuring Cat 1 tracing.  D/c home stable, precautions reviewed, follow-up as scheduled.

## 2016-05-18 NOTE — Discharge Instructions (Signed)
Call provider or return to birthplace with: ° °1. Strong regular contractions every 5 minutes. °2. Leaking of fluid from your vagina °3. Vaginal bleeding: Bright red or heavy like a period °4. Decreased Fetal movement ° °

## 2016-05-22 ENCOUNTER — Other Ambulatory Visit: Payer: Self-pay | Admitting: *Deleted

## 2016-05-22 DIAGNOSIS — O99013 Anemia complicating pregnancy, third trimester: Secondary | ICD-10-CM

## 2016-05-22 NOTE — Progress Notes (Signed)
[facility]  Telephone:([phone] Fax:(336) [phone]  ID: Pamela BornIris Jhoana Cole OB: Jan 23, 1996  MR#: [id number]  MVH#:[id number]#:[id number]  Patient Care Team: [facility] as PCP - General  CHIEF COMPLAINT: Iron deficiency anemia affecting the third trimester of pregnancy.  INTERVAL HISTORY: Patient returns to clinic today for repeat laboratory work and further evaluation. She continues to feel well and is asymptomatic. She does not complain of weakness or fatigue. She has no neurologic complaints. She denies any recent fevers or illnesses. She has a good appetite and is gaining weight appropriately. She has no chest pain or shortness of breath. She denies any nausea, vomiting, constipation, or diarrhea. She has no urinary complaints. Patient feels at her baseline and offers no specific complaints today.  REVIEW OF SYSTEMS:   Review of Systems  Constitutional: Negative.  Negative for fever, malaise/fatigue and weight loss.  Respiratory: Negative.  Negative for cough and shortness of breath.   Cardiovascular: Negative.  Negative for leg swelling.  Gastrointestinal: Negative.  Negative for abdominal pain, blood in stool and melena.  Genitourinary: Negative.   Musculoskeletal: Negative.   Neurological: Negative.  Negative for weakness.  Psychiatric/Behavioral: Negative.     As per HPI. Otherwise, a complete review of systems is negative.  PAST MEDICAL HISTORY: Past Medical History:  Diagnosis Date  . Anemia     PAST SURGICAL HISTORY: Past Surgical History:  Procedure Laterality Date  . NO PAST SURGERIES      FAMILY HISTORY: Reviewed and unchanged. No reported history of malignancy or chronic disease.  ADVANCED DIRECTIVES (Y/N):  N  HEALTH MAINTENANCE: Social History  Substance Use Topics  . Smoking status: Never Smoker  . Smokeless tobacco: Never Used  . Alcohol use No     Colonoscopy:  PAP:  Bone density:  Lipid panel:  No  Known Allergies  Current Outpatient Prescriptions  Medication Sig Dispense Refill  . ferrous sulfate 325 (65 FE) MG tablet Take 325 mg by mouth 3 (three) times daily with meals.    . Prenatal Vit-Fe Fumarate-FA (MULTIVITAMIN-PRENATAL) 27-0.8 MG TABS tablet Take 1 tablet by mouth daily at 12 noon.    . metroNIDAZOLE (FLAGYL) 500 MG tablet Take 1 tablet (500 mg total) by mouth 2 (two) times daily. 14 tablet 0  . nitrofurantoin, macrocrystal-monohydrate, (MACROBID) 100 MG capsule Take 1 capsule (100 mg total) by mouth 2 (two) times daily. 14 capsule 0   No current facility-administered medications for this visit.     OBJECTIVE: Vitals:   05/23/16 1357  BP: 104/70  Pulse: 80  Resp: 18  Temp: 97.5 F (36.4 C)     Body mass index is 27.83 kg/m.    ECOG FS:0 - Asymptomatic  General: Well-developed, well-nourished, no acute distress. Eyes: Pink conjunctiva, anicteric sclera. Lungs: Clear to auscultation bilaterally. Heart: Regular rate and rhythm. No rubs, murmurs, or gallops. Abdomen: Appears appropriate for gestational age.  Musculoskeletal: No edema, cyanosis, or clubbing. Neuro: Alert, answering all questions appropriately. Cranial nerves grossly intact. Skin: No rashes or petechiae noted. Psych: Normal affect.   LAB RESULTS:  Lab Results  Component Value Date   NA 135 12/20/2015   K 3.3 (L) 12/20/2015   CL 103 12/20/2015   CO2 23 12/20/2015   GLUCOSE 81 12/20/2015   BUN <5 (L) 12/20/2015   CREATININE 0.37 (L) 12/20/2015   CALCIUM 8.9 12/20/2015   PROT 7.2 12/20/2015   ALBUMIN 3.8 12/20/2015   AST 18 12/20/2015   ALT 12 (L) 12/20/2015  ALKPHOS 60 12/20/2015   BILITOT 0.4 12/20/2015   GFRNONAA >60 12/20/2015   GFRAA >60 12/20/2015    Lab Results  Component Value Date   WBC 9.3 05/25/2016   NEUTROABS 6.6 (H) 05/25/2016   HGB 10.6 (L) 05/25/2016   HCT 33.4 (L) 05/25/2016   MCV 68.0 (L) 05/25/2016   PLT 272 05/25/2016   Lab Results  Component Value Date    IRON 85 05/23/2016   TIBC 508 (H) 05/23/2016   IRONPCTSAT 17 05/23/2016   Lab Results  Component Value Date   FERRITIN 260 05/23/2016     STUDIES: US Ob Follow Up  Result Date: 05/18/2016 CLINICAL DATA:  Current assigned gestational age of [redacted] weeks 2 days. Measuring small for dates. EXAM: OBSTETRIC 14+ WK ULTRASOUND FOLLOW-UP COMPARISON:  05/04/2016 FINDINGS: Number of Fetuses: 1 Heart Rate:  135 bpm Movement: Yes Presentation: Cephalic Previa: No Placental Location: Fundal and left lateral Amniotic Fluid (Subjective): Lower normal range Amniotic Fluid (Objective): AFI 8.6 cm (5%ile= 7.5 cm, 95%= 24.4 cm for 37 wks) FETAL BIOMETRY BPD:  9.1cm 37w   0d HC:    32.7cm  37w   1d AC:   32.2cm  36w   1d FL:   7.0cm  36w   0d Current Mean GA: 36w 4d          [place] EDC: 06/11/2016 Assigned GA:  37w 2d              Assigned EDC:  06/06/2016 Estimated Fetal Weight: 2,898g 31%ile, compared to 42 %ile on previous exam FETAL ANATOMY Lateral Ventricles: Previously seen Thalami/CSP: Previously seen Posterior Fossa:  Previously seen Nuchal region:  Previously seen Upper Lip: Previously seen Spine: Visualized 4 Chamber Heart on Left: Appears normal LVOT: Previously seen RVOT: Previously seen Stomach on Left: Appears normal 3 Vessel Cord: Previously seen Cord Insertion site: Previously seen Kidneys: Appears normal Bladder: Appears normal Extremities: Previously seen Sex: Female previously seen Technically difficult due to: Advanced gestational age and fetal position MATERNAL FINDINGS: Cervix: Not visualized >34 weeks IMPRESSION: Assigned gestational age is currently 37 weeks 2 days. Estimated fetal weight is currently at 31 percentile, mildly decreased from 42 percentile on previous study. Low normal amniotic fluid volume, with AFI measuring 8.6 cm. Electronically Signed   By: Myles Rosenthal M.D.   On: 05/18/2016 19:56   US Ob Follow Up  Result Date: 05/04/2016 CLINICAL DATA:  Subsequent prenatal care. Assigned gestational  age is 35 weeks 2 days. EDC is 06/06/2016. EXAM: OBSTETRIC 14+ WK ULTRASOUND FOLLOW-UP FINDINGS: Number of Fetuses: 1 Heart Rate:  158 bpm Movement: Present Presentation: Cephalic Previa: None Placental Location: Fundal to the left Amniotic Fluid (Subjective): Lower ranges of normal Amniotic Fluid (Objective): AFI 8.4 cm (5%ile= 7.9 cm, 95%= 24.9 cm for 35 wks) FETAL BIOMETRY BPD:  8.63cm 34w   6d HC:    32.26cm  36w   3d AC:   31.12cm  35w   0d FL:   6.76cm  34w   5d Current Mean GA: 35w 1d          [place] EDC: 06/07/2016 Assigned GA:  35w 2d              Assigned EDC:  06/06/2016 Estimated Fetal Weight: 05/30/1992g 42%ile ; previous estimated fetal weight was 1829 g, 44 th percentile on 04/13/2016 FETAL ANATOMY Lateral Ventricles: Visualized Thalami/CSP: Visualized Posterior Fossa:  Previously seen Nuchal Region: Previously seen Upper Lip: Previously seen Spine: Visualized 4 Chamber Heart on Left:  Visualized LVOT: Previously seen RVOT: Previously seen Stomach on Left: Visualized 3 Vessel Cord: Previously seen Cord Insertion site: Previously seen Kidneys: Visualized Bladder: Visualized Extremities: Previously seen Sex: Visualized female Technically difficult due to: Not applicable MATERNAL FINDINGS: Cervix: Not evaluated IMPRESSION: 1. Single living intrauterine fetus in cephalic presentation. 2. No placenta previa. 3. Amniotic fluid measures in the lower ranges of normal. 4. No fetal anomalies are identified. 5. Appropriate interval growth. Electronically Signed   By: Norva Pavlov M.D.   On: 05/04/2016 15:42    ASSESSMENT: Iron deficiency anemia affecting the third trimester of pregnancy.  PLAN:    1.  Iron deficiency anemia affecting the third trimester of pregnancy: Patient's hemoglobin has significantly improved and her iron stores are now within normal limits. Previously, B12, folate, and hemoglobin electrophoresis were all within normal limits. No intervention is needed at this time. Return to clinic in  4 months for repeat laboratory and further evaluation. If hemoglobin returns to normal, she likely can be discharged from clinic.  2. Pregnancy: Continue follow-up with OB as scheduled. Patient's due date is June 06, 2016.  Patient expressed understanding and was in agreement with this plan. She also understands that She can call clinic at any time with any questions, concerns, or complaints.   Jeralyn Ruths, MD   05/26/2016 3:44 PM

## 2016-05-23 ENCOUNTER — Inpatient Hospital Stay: Payer: Self-pay

## 2016-05-23 ENCOUNTER — Inpatient Hospital Stay (HOSPITAL_BASED_OUTPATIENT_CLINIC_OR_DEPARTMENT_OTHER): Payer: Self-pay | Admitting: Oncology

## 2016-05-23 VITALS — BP 104/70 | HR 80 | Temp 97.5°F | Resp 18 | Wt 137.8 lb

## 2016-05-23 DIAGNOSIS — O99013 Anemia complicating pregnancy, third trimester: Secondary | ICD-10-CM

## 2016-05-23 DIAGNOSIS — D509 Iron deficiency anemia, unspecified: Secondary | ICD-10-CM

## 2016-05-23 DIAGNOSIS — Z79899 Other long term (current) drug therapy: Secondary | ICD-10-CM

## 2016-05-23 LAB — CBC WITH DIFFERENTIAL/PLATELET
Basophils Absolute: 0 10*3/uL (ref 0–0.1)
Basophils Relative: 0 %
Eosinophils Absolute: 0.1 10*3/uL (ref 0–0.7)
Eosinophils Relative: 1 %
HEMATOCRIT: 34.8 % — AB (ref 35.0–47.0)
HEMOGLOBIN: 11.1 g/dL — AB (ref 12.0–16.0)
LYMPHS ABS: 1.9 10*3/uL (ref 1.0–3.6)
LYMPHS PCT: 20 %
MCH: 21.5 pg — AB (ref 26.0–34.0)
MCHC: 31.9 g/dL — AB (ref 32.0–36.0)
MCV: 67.4 fL — AB (ref 80.0–100.0)
MONO ABS: 0.4 10*3/uL (ref 0.2–0.9)
MONOS PCT: 4 %
NEUTROS ABS: 7.1 10*3/uL — AB (ref 1.4–6.5)
NEUTROS PCT: 75 %
Platelets: 278 10*3/uL (ref 150–440)
RBC: 5.16 MIL/uL (ref 3.80–5.20)
RDW: 32.7 % — AB (ref 11.5–14.5)
WBC: 9.5 10*3/uL (ref 3.6–11.0)

## 2016-05-23 LAB — IRON AND TIBC
IRON: 85 ug/dL (ref 28–170)
Saturation Ratios: 17 % (ref 10.4–31.8)
TIBC: 508 ug/dL — ABNORMAL HIGH (ref 250–450)
UIBC: 423 ug/dL

## 2016-05-23 LAB — FERRITIN: Ferritin: 260 ng/mL (ref 11–307)

## 2016-05-23 NOTE — Progress Notes (Signed)
States is feeling well today. After last iron infusion, swelling and redness developed in both hands and feet. Also noted to have coldness to chest only. Also, pt mentions that is having burning and swelling in both breasts and wanted to mention since it did not happen in her first pregnancy.

## 2016-05-25 ENCOUNTER — Observation Stay
Admission: EM | Admit: 2016-05-25 | Discharge: 2016-05-25 | Disposition: A | Discharge status: Home / Self Care | Payer: Self-pay | Attending: Obstetrics and Gynecology | Admitting: Obstetrics and Gynecology

## 2016-05-25 ENCOUNTER — Other Ambulatory Visit: Payer: Self-pay | Admitting: Physician Assistant

## 2016-05-25 ENCOUNTER — Encounter: Payer: Self-pay | Admitting: *Deleted

## 2016-05-25 ENCOUNTER — Inpatient Hospital Stay
Admission: EM | Admit: 2016-05-25 | Discharge: 2016-05-25 | Disposition: A | Discharge status: Home / Self Care | Payer: Self-pay | Attending: Obstetrics and Gynecology | Admitting: Obstetrics and Gynecology

## 2016-05-25 DIAGNOSIS — Z3A38 38 weeks gestation of pregnancy: Secondary | ICD-10-CM | POA: Insufficient documentation

## 2016-05-25 DIAGNOSIS — M545 Low back pain: Secondary | ICD-10-CM | POA: Insufficient documentation

## 2016-05-25 DIAGNOSIS — O26893 Other specified pregnancy related conditions, third trimester: Secondary | ICD-10-CM | POA: Insufficient documentation

## 2016-05-25 DIAGNOSIS — Z3490 Encounter for supervision of normal pregnancy, unspecified, unspecified trimester: Secondary | ICD-10-CM

## 2016-05-25 DIAGNOSIS — Z3483 Encounter for supervision of other normal pregnancy, third trimester: Secondary | ICD-10-CM

## 2016-05-25 DIAGNOSIS — R102 Pelvic and perineal pain: Secondary | ICD-10-CM | POA: Insufficient documentation

## 2016-05-25 LAB — URINALYSIS COMPLETE WITH MICROSCOPIC (ARMC ONLY)
Bilirubin Urine: NEGATIVE
GLUCOSE, UA: NEGATIVE mg/dL
Hgb urine dipstick: NEGATIVE
Ketones, ur: NEGATIVE mg/dL
Nitrite: NEGATIVE
PROTEIN: NEGATIVE mg/dL
SPECIFIC GRAVITY, URINE: 1.004 — AB (ref 1.005–1.030)
pH: 7 (ref 5.0–8.0)

## 2016-05-25 LAB — WET PREP, GENITAL
SPERM: NONE SEEN
TRICH WET PREP: NONE SEEN
WBC WET PREP: NONE SEEN
YEAST WET PREP: NONE SEEN

## 2016-05-25 LAB — CBC WITH DIFFERENTIAL/PLATELET
BASOS ABS: 0 10*3/uL (ref 0–0.1)
BASOS PCT: 0 %
EOS ABS: 0.1 10*3/uL (ref 0–0.7)
EOS PCT: 1 %
HCT: 33.4 % — ABNORMAL LOW (ref 35.0–47.0)
Hemoglobin: 10.6 g/dL — ABNORMAL LOW (ref 12.0–16.0)
Lymphocytes Relative: 20 %
Lymphs Abs: 1.9 10*3/uL (ref 1.0–3.6)
MCH: 21.6 pg — ABNORMAL LOW (ref 26.0–34.0)
MCHC: 31.8 g/dL — AB (ref 32.0–36.0)
MCV: 68 fL — ABNORMAL LOW (ref 80.0–100.0)
MONO ABS: 0.6 10*3/uL (ref 0.2–0.9)
MONOS PCT: 7 %
NEUTROS ABS: 6.6 10*3/uL — AB (ref 1.4–6.5)
Neutrophils Relative %: 72 %
PLATELETS: 272 10*3/uL (ref 150–440)
RBC: 4.91 MIL/uL (ref 3.80–5.20)
RDW: 32.5 % — AB (ref 11.5–14.5)
WBC: 9.3 10*3/uL (ref 3.6–11.0)

## 2016-05-25 MED ORDER — METRONIDAZOLE 500 MG PO TABS: 500.0000 mg | ORAL_TABLET | Freq: Two times a day (BID) | ORAL | 0 refills | Status: DC

## 2016-05-25 MED ORDER — NITROFURANTOIN MONOHYD MACRO 100 MG PO CAPS: 100.0000 mg | ORAL_CAPSULE | Freq: Two times a day (BID) | ORAL | 0 refills | Status: DC

## 2016-05-25 NOTE — OB Triage Note (Signed)
Recvd from ED. Pt c/o burning when she urinates, rare upper abdominal pain and constant back pain. Feeling baby move less than usual. Pt states she has yellow discharge that started yesterday evening. No intercourse in the past 24 hours. Staying well hydrated.

## 2016-05-25 NOTE — Discharge Instructions (Signed)
Come back if:  Big gush of fluids Temp over 100.4 Heavy vaginal bleeding Contractions every 3-5 min lasting at least one hour  Stay well hydrated and get plenty of rest!

## 2016-05-25 NOTE — Progress Notes (Signed)
TRIAGE VISIT with NST   Pamela Cole is a 20 y.o. G2P1. She is at [redacted]w[redacted]d gestation, presenting with lower abdominal pain, suprapubic pain, pelvic girdle pain, and constant mid to low back pain.  She states she is feeling her baby move well now, but was not feeling him move earlier.  She denies recent intercourse.  She denies VB or LOF, but states she has had some yellow discharge.  She also reports dysuria. She receives prenatal care at [facility].  She has had an uncomplicated pregnancy, except for S<D and AFI last week was 8.  It was recommended to have a repeat AFI this week, and she is scheduled for an [facility] appt tomorrow afternoon.   O:  BP (!) 109/53 (BP Location: Left Arm)   Pulse 100   Temp 98.8 F (37.1 C) (Oral)   Resp 16   LMP 08/31/2015  Results for orders placed or performed during the hospital encounter of 05/25/16 (from the past 48 hour(s))  CBC with Differential/Platelet   Collection Time: 05/25/16  1:11 AM  Result Value Ref Range   WBC 9.3 3.6 - 11.0 K/uL   RBC 4.91 3.80 - 5.20 MIL/uL   Hemoglobin 10.6 (L) 12.0 - 16.0 g/dL   HCT 46.9 (L) 62.9 - 52.8 %   MCV 68.0 (L) 80.0 - 100.0 fL   MCH 21.6 (L) 26.0 - 34.0 pg   MCHC 31.8 (L) 32.0 - 36.0 g/dL   RDW 41.3 (H) 24.4 - 01.0 %   Platelets 272 150 - 440 K/uL   Neutrophils Relative % 72 %   Neutro Abs 6.6 (H) 1.4 - 6.5 K/uL   Lymphocytes Relative 20 %   Lymphs Abs 1.9 1.0 - 3.6 K/uL   Monocytes Relative 7 %   Monocytes Absolute 0.6 0.2 - 0.9 K/uL   Eosinophils Relative 1 %   Eosinophils Absolute 0.1 0 - 0.7 K/uL   Basophils Relative 0 %   Basophils Absolute 0.0 0 - 0.1 K/uL  Wet prep, genital   Collection Time: 05/25/16  1:15 AM  Result Value Ref Range   Yeast Wet Prep HPF POC NONE SEEN NONE SEEN   Trich, Wet Prep NONE SEEN NONE SEEN   Clue Cells Wet Prep HPF POC PRESENT (A) NONE SEEN   WBC, Wet Prep HPF POC NONE SEEN NONE SEEN   Sperm NONE SEEN   Urinalysis complete, with microscopic ([facility] only)   Collection Time: 05/25/16  1:15 AM  Result Value Ref Range   Color, Urine STRAW (A) YELLOW   APPearance HAZY (A) CLEAR   Glucose, UA NEGATIVE NEGATIVE mg/dL   Bilirubin Urine NEGATIVE NEGATIVE   Ketones, ur NEGATIVE NEGATIVE mg/dL   Specific Gravity, Urine 1.004 (L) 1.005 - 1.030   Hgb urine dipstick NEGATIVE NEGATIVE   pH 7.0 5.0 - 8.0   Protein, ur NEGATIVE NEGATIVE mg/dL   Nitrite NEGATIVE NEGATIVE   Leukocytes, UA 3+ (A) NEGATIVE   RBC / HPF 0-5 0 - 5 RBC/hpf   WBC, UA 6-30 0 - 5 WBC/hpf   Bacteria, UA RARE (A) NONE SEEN   Squamous Epithelial / LPF 0-5 (A) NONE SEEN   Mucous PRESENT    Amorphous Crystal PRESENT   Results for orders placed or performed in visit on 05/23/16 (from the past 48 hour(s))  CBC with Differential   Collection Time: 05/23/16  1:25 PM  Result Value Ref Range   WBC 9.5 3.6 - 11.0 K/uL   RBC 5.16 3.80 - 5.20  MIL/uL   Hemoglobin 11.1 (L) 12.0 - 16.0 g/dL   HCT 13.034.8 (L) 86.535.0 - 78.447.0 %   MCV 67.4 (L) 80.0 - 100.0 fL   MCH 21.5 (L) 26.0 - 34.0 pg   MCHC 31.9 (L) 32.0 - 36.0 g/dL   RDW 69.632.7 (H) 29.511.5 - 28.414.5 %   Platelets 278 150 - 440 K/uL   Neutrophils Relative % 75 %   Neutro Abs 7.1 (H) 1.4 - 6.5 K/uL   Lymphocytes Relative 20 %   Lymphs Abs 1.9 1.0 - 3.6 K/uL   Monocytes Relative 4 %   Monocytes Absolute 0.4 0.2 - 0.9 K/uL   Eosinophils Relative 1 %   Eosinophils Absolute 0.1 0 - 0.7 K/uL   Basophils Relative 0 %   Basophils Absolute 0.0 0 - 0.1 K/uL  Ferritin   Collection Time: 05/23/16  1:25 PM  Result Value Ref Range   Ferritin 260 11 - 307 ng/mL  Iron and TIBC   Collection Time: 05/23/16  1:25 PM  Result Value Ref Range   Iron 85 28 - 170 ug/dL   TIBC 132508 (H) 440250 - 102450 ug/dL   Saturation Ratios 17 10.4 - 31.8 %   UIBC 423 ug/dL     Gen: NAD, AAOx3  Lungs: clear, equal bilaterally  Heart: S1, S2, RRR     Abd: non-tender Uterus: gravid, nontender +suprapubic tenderness +Right CVAT, mild tenderness on left side also       Ext:  Non-tender, Nonedmeatous    NST/FHT:  Baseline: 135 bpm/ moderate variability/ +accels/ no decels  TOCO: 5-8 minutes  VOZ:[id number]:Dilation: 2 Effacement (%): 60 Station: 0 Presentation: Vertex Exam by:: M. Amous Crewe  NST: Reactive. See FHT above for particulars.  A/P:  20 y.o. G2P1 150w2d with lower abdominal pain, suprapubic pain, pelvic girdle pain, and constant mid to low back pain.    Labor: not present. No cervical change  D/C home   +3 Leukocytes likely Urinary Tract Infection: macrobid 100mg  BID x 7 days - rx sent home with patient  BV: Flagyl 500mg  BID x 7 days - rx sent home with patient  Tylenol PRN for pain  Call for worsening s/s, fever, chills  FKC's daily   Labor precautions and warning s/s reviewed  Fetal Wellbeing: NST reactive Reassuring Cat 1 tracing.  D/c home stable, precautions reviewed, follow-up with [facility] tomorrow for appt and needs AFI.  Carlean JewsMeredith Jaremy Nosal, CNM

## 2016-05-25 NOTE — Discharge Instructions (Signed)
Come back if:   Big gush of fluids Temp over 100.4 Heavy vaginal bleeding  Decreased fetal movement  Continue doing fetal kick counts.  Get plenty of rest and stay well hydrated!

## 2016-05-25 NOTE — OB Triage Note (Signed)
Recvd pt from ED. C/O contractions every 5-6 min since 1200pm. Baby is moving good. No intercourse in past 24 hours. Staying well hydrated.

## 2016-05-26 LAB — URINE CULTURE

## 2016-05-29 ENCOUNTER — Other Ambulatory Visit: Payer: Self-pay | Admitting: Physician Assistant

## 2016-05-29 ENCOUNTER — Ambulatory Visit
Admission: RE | Admit: 2016-05-29 | Discharge: 2016-05-29 | Disposition: A | Discharge status: Home / Self Care | Payer: Self-pay | Source: Ambulatory Visit | Attending: Physician Assistant | Admitting: Physician Assistant

## 2016-05-29 ENCOUNTER — Other Ambulatory Visit: Payer: Self-pay | Admitting: Obstetrics and Gynecology

## 2016-05-29 ENCOUNTER — Observation Stay
Admission: RE | Admit: 2016-05-29 | Discharge: 2016-05-29 | Disposition: A | Discharge status: Home / Self Care | Payer: Self-pay | Attending: Obstetrics and Gynecology | Admitting: Obstetrics and Gynecology

## 2016-05-29 DIAGNOSIS — O4100X0 Oligohydramnios, unspecified trimester, not applicable or unspecified: Secondary | ICD-10-CM | POA: Diagnosis present

## 2016-05-29 DIAGNOSIS — Z3A37 37 weeks gestation of pregnancy: Secondary | ICD-10-CM | POA: Insufficient documentation

## 2016-05-29 DIAGNOSIS — Z3483 Encounter for supervision of other normal pregnancy, third trimester: Secondary | ICD-10-CM | POA: Insufficient documentation

## 2016-05-29 DIAGNOSIS — Z3A38 38 weeks gestation of pregnancy: Secondary | ICD-10-CM | POA: Insufficient documentation

## 2016-05-29 DIAGNOSIS — O365939 Maternal care for other known or suspected poor fetal growth, third trimester, other fetus: Secondary | ICD-10-CM | POA: Diagnosis present

## 2016-05-29 DIAGNOSIS — O4103X0 Oligohydramnios, third trimester, not applicable or unspecified: Principal | ICD-10-CM | POA: Insufficient documentation

## 2016-05-29 NOTE — OB Triage Note (Signed)
Reactive NST. Discharge patient home.

## 2016-05-29 NOTE — Progress Notes (Signed)
Patient ID: Pamela Cole, female   DOB: 1996/06/24, 20 y.o.   MRN: [id number] Pamela Cole 1996/06/24 G2 P1 1827w6d presents for afi on u/s of 6.7 cm . Growth 19% . Good fetal  movt  noLOF , no vaginal bleeding , PMHX : anemia, proteinuria   socail >: no etoh , no tobacco  O;BP 107/67 (BP Location: Left Arm)   Pulse 86   Temp 98.3 F (36.8 C) (Oral)   Resp 18   Ht 4\' 11"  (1.499 m)   Wt 137 lb (62.1 kg)   LMP 08/31/2015   BMI 27.67 kg/m  ABDsoft NT  CX 1-2 cm / 50/ VTX  NSTreactive nst , rare ctx , no decels Labs: none A: low nl AFI , with reactive NST  P:RTC Thursday for repeat AFI / nst . If decrease fetal movt or afi < 5 cm will induce labor

## 2016-05-29 NOTE — Discharge Summary (Signed)
  Suzy Bouchardhomas J Schermerhorn, MD  Obstetrics    [] Hide copied text [] Hover for attribution information Patient ID: Pamela Cole Pamela Cole, female   DOB: 04-03-1996, 20 y.o.   MRN: [id number] Pamela Cole 04-03-1996 G2 P1 8061w6d presents for afi on u/s of 6.7 cm . Growth 19% . Good fetal  movt  noLOF , no vaginal bleeding , PMHX : anemia, proteinuria   socail >: no etoh , no tobacco  O;BP 107/67 (BP Location: Left Arm)   Pulse 86   Temp 98.3 F (36.8 C) (Oral)   Resp 18   Ht 4\' 11"  (1.499 m)   Wt 137 lb (62.1 kg)   LMP 08/31/2015   BMI 27.67 kg/m  ABDsoft NT  CX 1-2 cm / 50/ VTX  NSTreactive nst , rare ctx , no decels Labs: none A: low nl AFI , with reactive NST  P:RTC Thursday for repeat AFI / nst . If decrease fetal movt or afi < 5 cm will induce labor     Electronically signed by Suzy Bouchardhomas J Schermerhorn, MD at 05/29/2016 4:55 PM      Admission (Discharged) on 05/29/2016        Detailed Report

## 2016-05-29 NOTE — Progress Notes (Signed)
I saw patient on 10/19 and treated her for UTI symptoms and +3 leukocytes.  Her urine culture showed <10,000 colonies.  I informed Misty StanleyLisa to have patient stop taking the Macrobid.  She will inform patient.  Carlean JewsMeredith Sigmon, CNM

## 2016-05-30 ENCOUNTER — Other Ambulatory Visit: Payer: Self-pay | Admitting: Advanced Practice Midwife

## 2016-05-30 ENCOUNTER — Ambulatory Visit: Payer: Self-pay | Admitting: Oncology

## 2016-05-30 ENCOUNTER — Other Ambulatory Visit: Payer: Self-pay

## 2016-05-30 ENCOUNTER — Ambulatory Visit: Payer: Self-pay

## 2016-05-30 DIAGNOSIS — O4100X0 Oligohydramnios, unspecified trimester, not applicable or unspecified: Secondary | ICD-10-CM

## 2016-05-31 ENCOUNTER — Inpatient Hospital Stay
Admission: RE | Admit: 2016-05-31 | Discharge: 2016-05-31 | Disposition: A | Discharge status: Home / Self Care | Payer: Self-pay | Attending: Obstetrics and Gynecology | Admitting: Obstetrics and Gynecology

## 2016-05-31 DIAGNOSIS — Z3A39 39 weeks gestation of pregnancy: Secondary | ICD-10-CM | POA: Insufficient documentation

## 2016-05-31 DIAGNOSIS — O368130 Decreased fetal movements, third trimester, not applicable or unspecified: Secondary | ICD-10-CM | POA: Insufficient documentation

## 2016-05-31 NOTE — Discharge Summary (Signed)
Physician Obstetric Discharge Summary  Patient ID: Pamela Cole MRN: [id number] DOB/AGE: 19-07-1996 19 y.o.   Date of Admission: 05/31/2016  Date of Discharge:   Admitting Diagnosis: term at 2068w1d  Secondary Diagnosis: IUGR, decreased fetal movement  Discharge Diagnosis: Term pregnancy with normal NST   Intrapartum Procedures: none   Post partum procedures: not delivered yet  Complications: none   Brief Hospital Course  Pamela Cole is a G2P1 who presented for decreased fetal movement and has now had a normal NST with 2 accels 15 x 15 BPM after monitoring for approx 1 hour.  Labs: CBC Latest Ref Rng & Units 05/25/2016 05/23/2016 04/18/2016  WBC 3.6 - 11.0 K/uL 9.3 9.5 11.7(H)  Hemoglobin 12.0 - 16.0 g/dL 10.6(L) 11.1(L) 8.5(L)  Hematocrit 35.0 - 47.0 % 33.4(L) 34.8(L) 27.2(L)  Platelets 150 - 440 K/uL 272 278 284    Physical exam:  Exam not done from office.   Discharge Instructions: Per After Visit Summary. Activity: Advance as tolerated. Also refer to Discharge Instructions Diet: Regular  Outpatient follow up:  Follow-up Information    [facility] Follow up on 06/01/2016.   Why:  return for ultrasound and repeat Non Stress test Contact information: [address] [place] [phone]          Discharged Condition: Stable   Discharged to: Home    FU tomorrow for US for AFI and growth.  Sharee Pimplearon W Natthew Marlatt, CNM 05/31/2016 2:35 PM

## 2016-05-31 NOTE — Discharge Instructions (Signed)
Fetal Movement Counts  Patient Name: __________________________________________________ Patient Due Date: ____________________  Performing a fetal movement count is highly recommended in high-risk pregnancies, but it is good for every pregnant woman to do. Your health care provider may ask you to start counting fetal movements at 28 weeks of the pregnancy. Fetal movements often increase:  · After eating a full meal.  · After physical activity.  · After eating or drinking something sweet or cold.  · At rest.  Pay attention to when you feel the baby is most active. This will help you notice a pattern of your baby's sleep and wake cycles and what factors contribute to an increase in fetal movement. It is important to perform a fetal movement count at the same time each day when your baby is normally most active.   HOW TO COUNT FETAL MOVEMENTS  1. Find a quiet and comfortable area to sit or lie down on your left side. Lying on your left side provides the best blood and oxygen circulation to your baby.  2. Write down the day and time on a sheet of paper or in a journal.  3. Start counting kicks, flutters, swishes, rolls, or jabs in a 2-hour period. You should feel at least 10 movements within 2 hours.  4. If you do not feel 10 movements in 2 hours, wait 2-3 hours and count again. Look for a change in the pattern or not enough counts in 2 hours.  SEEK MEDICAL CARE IF:  · You feel less than 10 counts in 2 hours, tried twice.  · There is no movement in over an hour.  · The pattern is changing or taking longer each day to reach 10 counts in 2 hours.  · You feel the baby is not moving as he or she usually does.  Date: ____________ Movements: ____________ Start time: ____________ Finish time: ____________   Date: ____________ Movements: ____________ Start time: ____________ Finish time: ____________  Date: ____________ Movements: ____________ Start time: ____________ Finish time: ____________  Date: ____________ Movements:  ____________ Start time: ____________ Finish time: ____________  Date: ____________ Movements: ____________ Start time: ____________ Finish time: ____________  Date: ____________ Movements: ____________ Start time: ____________ Finish time: ____________  Date: ____________ Movements: ____________ Start time: ____________ Finish time: ____________  Date: ____________ Movements: ____________ Start time: ____________ Finish time: ____________   Date: ____________ Movements: ____________ Start time: ____________ Finish time: ____________  Date: ____________ Movements: ____________ Start time: ____________ Finish time: ____________  Date: ____________ Movements: ____________ Start time: ____________ Finish time: ____________  Date: ____________ Movements: ____________ Start time: ____________ Finish time: ____________  Date: ____________ Movements: ____________ Start time: ____________ Finish time: ____________  Date: ____________ Movements: ____________ Start time: ____________ Finish time: ____________  Date: ____________ Movements: ____________ Start time: ____________ Finish time: ____________   Date: ____________ Movements: ____________ Start time: ____________ Finish time: ____________  Date: ____________ Movements: ____________ Start time: ____________ Finish time: ____________  Date: ____________ Movements: ____________ Start time: ____________ Finish time: ____________  Date: ____________ Movements: ____________ Start time: ____________ Finish time: ____________  Date: ____________ Movements: ____________ Start time: ____________ Finish time: ____________  Date: ____________ Movements: ____________ Start time: ____________ Finish time: ____________  Date: ____________ Movements: ____________ Start time: ____________ Finish time: ____________   Date: ____________ Movements: ____________ Start time: ____________ Finish time: ____________  Date: ____________ Movements: ____________ Start time: ____________ Finish  time: ____________  Date: ____________ Movements: ____________ Start time: ____________ Finish time: ____________  Date: ____________ Movements: ____________ Start time:   ____________ Finish time: ____________  Date: ____________ Movements: ____________ Start time: ____________ Finish time: ____________  Date: ____________ Movements: ____________ Start time: ____________ Finish time: ____________  Date: ____________ Movements: ____________ Start time: ____________ Finish time: ____________   Date: ____________ Movements: ____________ Start time: ____________ Finish time: ____________  Date: ____________ Movements: ____________ Start time: ____________ Finish time: ____________  Date: ____________ Movements: ____________ Start time: ____________ Finish time: ____________  Date: ____________ Movements: ____________ Start time: ____________ Finish time: ____________  Date: ____________ Movements: ____________ Start time: ____________ Finish time: ____________  Date: ____________ Movements: ____________ Start time: ____________ Finish time: ____________  Date: ____________ Movements: ____________ Start time: ____________ Finish time: ____________   Date: ____________ Movements: ____________ Start time: ____________ Finish time: ____________  Date: ____________ Movements: ____________ Start time: ____________ Finish time: ____________  Date: ____________ Movements: ____________ Start time: ____________ Finish time: ____________  Date: ____________ Movements: ____________ Start time: ____________ Finish time: ____________  Date: ____________ Movements: ____________ Start time: ____________ Finish time: ____________  Date: ____________ Movements: ____________ Start time: ____________ Finish time: ____________  Date: ____________ Movements: ____________ Start time: ____________ Finish time: ____________   Date: ____________ Movements: ____________ Start time: ____________ Finish time: ____________  Date: ____________  Movements: ____________ Start time: ____________ Finish time: ____________  Date: ____________ Movements: ____________ Start time: ____________ Finish time: ____________  Date: ____________ Movements: ____________ Start time: ____________ Finish time: ____________  Date: ____________ Movements: ____________ Start time: ____________ Finish time: ____________  Date: ____________ Movements: ____________ Start time: ____________ Finish time: ____________  Date: ____________ Movements: ____________ Start time: ____________ Finish time: ____________   Date: ____________ Movements: ____________ Start time: ____________ Finish time: ____________  Date: ____________ Movements: ____________ Start time: ____________ Finish time: ____________  Date: ____________ Movements: ____________ Start time: ____________ Finish time: ____________  Date: ____________ Movements: ____________ Start time: ____________ Finish time: ____________  Date: ____________ Movements: ____________ Start time: ____________ Finish time: ____________  Date: ____________ Movements: ____________ Start time: ____________ Finish time: ____________     This information is not intended to replace advice given to you by your health care provider. Make sure you discuss any questions you have with your health care provider.     Document Released: 08/23/2006 Document Revised: 08/14/2014 Document Reviewed: 05/20/2012  Elsevier Interactive Patient Education ©2016 [employer]

## 2016-05-31 NOTE — OB Triage Provider Note (Signed)
TRIAGE VISIT with NST  Decreased Fetal Movement.  Subjective:   Pamela Cole is a 20 y.o. female. She is at 6221w1d gestation. She has noted decreased fetal movement today at [facility] and was sent for NST. Pt has hx of IUGR at 19% and low AFI which will be checked tomorrow by US.  NO UC', LOF, VB per RN.  Her pregnancy has been complicated by: IUGR and low AFI Past Medical History:  Diagnosis Date  . Anemia    Past Surgical History:  Procedure Laterality Date  . NO PAST SURGERIES    No family history on file.  Social History   Social History  . Marital status: Single    Spouse name: N/A  . Number of children: N/A  . Years of education: N/A   Occupational History  . Not on file.   Social History Main Topics  . Smoking status: Never Smoker  . Smokeless tobacco: Never Used  . Alcohol use No  . Drug use: No  . Sexual activity: Yes    Birth control/ protection: Injection   Other Topics Concern  . Not on file   Social History Narrative  . No narrative on file   PMH, PSH, POBH and problem list reviewed Medications and allergies reviewed.    Objective:   Wt Readings from Last 3 Encounters:  05/29/16 137 lb (62.1 kg) (67 %, Z= 0.43)*  05/23/16 137 lb 12.6 oz (62.5 kg) (68 %, Z= 0.46)*  05/18/16 134 lb (60.8 kg) (62 %, Z= 0.31)*   * Growth percentiles are based on CDC 2-20 Years data.   Temp Readings from Last 3 Encounters:  05/29/16 98.3 F (36.8 C) (Oral)  05/25/16 97.9 F (36.6 C) (Oral)  05/25/16 98.8 F (37.1 C) (Oral)   BP Readings from Last 3 Encounters:  05/29/16 107/67  05/25/16 111/72  05/25/16 (!) 109/53   Pulse Readings from Last 3 Encounters:  05/29/16 86  05/25/16 93  05/25/16 100  . External fetal monitoring: A: IUP at term with IUGR and low normal  AFI P: NST  Assessment:   Pregnancy at 7221w1d with concerns for decreased fetal movement. Evaluation reveals:  Plan:  Orders placed: No orders of the defined types were placed in  this encounter.   Patient expresses understanding of information provided and plan of care.

## 2016-05-31 NOTE — OB Triage Note (Signed)
Sent from [facility] for decreased fetal movement.

## 2016-06-01 ENCOUNTER — Other Ambulatory Visit: Payer: Self-pay | Admitting: Advanced Practice Midwife

## 2016-06-01 ENCOUNTER — Observation Stay
Admission: RE | Admit: 2016-06-01 | Discharge: 2016-06-01 | Disposition: A | Discharge status: Home / Self Care | Payer: Self-pay | Attending: Obstetrics & Gynecology | Admitting: Obstetrics & Gynecology

## 2016-06-01 ENCOUNTER — Encounter: Payer: Self-pay | Admitting: *Deleted

## 2016-06-01 ENCOUNTER — Inpatient Hospital Stay: Payer: Self-pay

## 2016-06-01 DIAGNOSIS — O365930 Maternal care for other known or suspected poor fetal growth, third trimester, not applicable or unspecified: Principal | ICD-10-CM | POA: Insufficient documentation

## 2016-06-01 DIAGNOSIS — O26843 Uterine size-date discrepancy, third trimester: Secondary | ICD-10-CM

## 2016-06-01 DIAGNOSIS — D649 Anemia, unspecified: Secondary | ICD-10-CM | POA: Insufficient documentation

## 2016-06-01 DIAGNOSIS — O0993 Supervision of high risk pregnancy, unspecified, third trimester: Secondary | ICD-10-CM

## 2016-06-01 DIAGNOSIS — O4103X0 Oligohydramnios, third trimester, not applicable or unspecified: Secondary | ICD-10-CM | POA: Insufficient documentation

## 2016-06-01 DIAGNOSIS — O4100X0 Oligohydramnios, unspecified trimester, not applicable or unspecified: Secondary | ICD-10-CM

## 2016-06-01 DIAGNOSIS — Z3A39 39 weeks gestation of pregnancy: Secondary | ICD-10-CM | POA: Insufficient documentation

## 2016-06-01 NOTE — Discharge Summary (Signed)
Pamela Cole is a 20 y.o. female. She is at 911w2d gestation.  Chief Complaint:  S: Resting comfortably. no CTX, no VB.no LOF,  Active fetal movement.   Patient here only for scheduled NST and AFI.  See procedure note.   Maternal Medical History:   Past Medical History:  Diagnosis Date  . Anemia     Past Surgical History:  Procedure Laterality Date  . NO PAST SURGERIES      No Known Allergies  Prior to Admission medications   Medication Sig Start Date End Date Taking? Authorizing Provider  ferrous sulfate 325 (65 FE) MG tablet Take 325 mg by mouth 3 (three) times daily with meals.    Historical Provider, MD  metroNIDAZOLE (FLAGYL) 500 MG tablet Take 1 tablet (500 mg total) by mouth 2 (two) times daily. Patient not taking: Reported on 06/01/2016 05/25/16   Karena AddisonMeredith C Sigmon, CNM  nitrofurantoin, macrocrystal-monohydrate, (MACROBID) 100 MG capsule Take 1 capsule (100 mg total) by mouth 2 (two) times daily. Patient not taking: Reported on 06/01/2016 05/25/16   Karena AddisonMeredith C Sigmon, CNM  Prenatal Vit-Fe Fumarate-FA (MULTIVITAMIN-PRENATAL) 27-0.8 MG TABS tablet Take 1 tablet by mouth daily at 12 noon.    Historical Provider, MD     [facility]  Social History: She  reports that she has never smoked. She has never used smokeless tobacco. She reports that she does not drink alcohol or use drugs.  Family History: family history is not on file.   Review of Systems: A full review of systems was performed and negative except as noted in the HPI.     O:  BP 106/68 (BP Location: Right Arm)   Pulse 91   Temp 98.2 F (36.8 C) (Oral)   Resp 16   LMP 08/31/2015  No results found for this or any previous visit (from the past 48 hour(s)).     FHT: 130 mod + accels no decels TOCO: quiet AFI: 6.1cm    A/P:  20yo G2P1001 @ 39+2 with low fluid and SGA infant.   Labor: not present.   Fetal Wellbeing: Reassuring Cat 1  tracing.  NST: reactive  AFI: low, but not oligo.  RN spoke to [facility] - they are still managing this patient and will decide whether she will need IOL or intervention.  I recommended a growth scan in the next week.  D/c home stable, precautions reviewed, follow-up as scheduled.   ----- Ranae Plumberhelsea Emalina Dubreuil, MD Attending Obstetrician and Gynecologist [facility], Department of OB/GYN [facility]

## 2016-06-01 NOTE — Discharge Instructions (Signed)
Drink plenty of fluid and get plenty of rest. Call your provider for any other concern.

## 2016-06-01 NOTE — Discharge Summary (Signed)
Patient discharged home, discharge instructions given, patient states understanding. Patient left floor in stable condition, denies any other needs at this time. Patient to keep next scheduled OB appointment 

## 2016-06-01 NOTE — Discharge Summary (Signed)
TRIAGE VISIT with NST   Pamela Cole is a 20 y.o. G2P1. She is at 5652w2d gestation, presenting with signs of labor.  Indication: Contractions  S: Resting comfortably. intermittent CTX, no VB. Active fetal movement. O:  BP 111/72   Pulse 93   Temp 97.9 F (36.6 C) (Oral)   Resp 16   LMP 08/31/2015  No results found for this or any previous visit (from the past 48 hour(s)).   Gen: NAD, AAOx3      Abd: FNTTP      Ext: Non-tender, Nonedmeatous    NST/FHT: 140s, mod var, +accels, no decels TOCO: quiet GUY:[id number]:Dilation: 3 Effacement (%): 60 Station: 0 Presentation: Vertex Exam by:: MBS  NST: Reactive. See FHT above for particulars.  A/P:  20 y.o. G2P1 2645w2d with early labor, unchanged cervix.   Labor: not present.   Fetal Wellbeing: NST reactive Reassuring Cat 1 tracing.  D/c home stable, precautions reviewed, follow-up as scheduled.

## 2016-06-01 NOTE — Procedures (Signed)
NST   Indication: poor fetal growth, low amniotic fluid  Baseline: 130 Variablility: moderate Accelerations:  Present, 15x15, >2 Decelerations: absent  Interpretation: Category: 1 Reactive  ----- Ranae Plumberhelsea Ward, MD Attending Obstetrician and Gynecologist [facility], Department of OB/GYN [facility]

## 2016-06-05 ENCOUNTER — Observation Stay
Admission: RE | Admit: 2016-06-05 | Discharge: 2016-06-05 | Disposition: A | Discharge status: Home / Self Care | Payer: Self-pay | Attending: Obstetrics & Gynecology | Admitting: Obstetrics & Gynecology

## 2016-06-05 ENCOUNTER — Encounter: Payer: Self-pay | Admitting: *Deleted

## 2016-06-05 ENCOUNTER — Inpatient Hospital Stay: Payer: Self-pay

## 2016-06-05 DIAGNOSIS — O0993 Supervision of high risk pregnancy, unspecified, third trimester: Secondary | ICD-10-CM

## 2016-06-05 DIAGNOSIS — O4103X0 Oligohydramnios, third trimester, not applicable or unspecified: Principal | ICD-10-CM | POA: Insufficient documentation

## 2016-06-05 DIAGNOSIS — O288 Other abnormal findings on antenatal screening of mother: Secondary | ICD-10-CM

## 2016-06-05 DIAGNOSIS — Z3A36 36 weeks gestation of pregnancy: Secondary | ICD-10-CM | POA: Insufficient documentation

## 2016-06-05 DIAGNOSIS — D649 Anemia, unspecified: Secondary | ICD-10-CM | POA: Insufficient documentation

## 2016-06-05 NOTE — OB Triage Note (Signed)
G2P1 presents at 4069w6d for scheduled NST for oligohydroaminos.

## 2016-06-05 NOTE — Procedures (Signed)
NST   Indication: poor fetal growth, low amniotic fluid  Baseline: 145 Variablility: moderate Accelerations:  Present, 15x15, >2 Decelerations: absent  Interpretation: Category: 1 Reactive  ----- Ranae Plumberhelsea Geneva Barrero, MD Attending Obstetrician and Gynecologist [facility], Department of OB/GYN [facility]

## 2016-06-05 NOTE — Discharge Summary (Signed)
Pamela Cole is a 20 y.o. female. She is at 5147w2d gestation  S: Resting comfortably. no CTX, no VB.no LOF,  Active fetal movement.   Patient here only for scheduled NST and AFI.  See procedure note.   Maternal Medical History:   Past Medical History:  Diagnosis Date  . Anemia     Past Surgical History:  Procedure Laterality Date  . NO PAST SURGERIES      No Known Allergies  Prior to Admission medications   Medication Sig Start Date End Date Taking? Authorizing Provider  ferrous sulfate 325 (65 FE) MG tablet Take 325 mg by mouth 3 (three) times daily with meals.    Historical Provider, MD  metroNIDAZOLE (FLAGYL) 500 MG tablet Take 1 tablet (500 mg total) by mouth 2 (two) times daily. Patient not taking: Reported on 06/01/2016 05/25/16   Karena AddisonMeredith C Sigmon, CNM  nitrofurantoin, macrocrystal-monohydrate, (MACROBID) 100 MG capsule Take 1 capsule (100 mg total) by mouth 2 (two) times daily. Patient not taking: Reported on 06/01/2016 05/25/16   Karena AddisonMeredith C Sigmon, CNM  Prenatal Vit-Fe Fumarate-FA (MULTIVITAMIN-PRENATAL) 27-0.8 MG TABS tablet Take 1 tablet by mouth daily at 12 noon.    Historical Provider, MD     Prenatal care site: [facility]  Social History: She  reports that she has never smoked. She has never used smokeless tobacco. She reports that she does not drink alcohol or use drugs.  Family History: family history is not on file.   Review of Systems: A full review of systems was performed and negative except as noted in the HPI.     O: 113/71, P 84, RR 16 LMP 08/31/2015     FHT: 145 mod + accels no decels TOCO: occasional AFI: 6.1cm    A/P:  20yo G2P1001 @ 39+2 with low fluid and SGA infant.   Labor: not present.   Fetal Wellbeing: Reassuring Cat 1 tracing.  NST: reactive  AFI: low, but not oligo.  [facility] is still managing this patient and will decide whether she will need IOL or intervention. Although I had recommended a  growth scan in the next week, this was not ordered for today.  D/c home stable, precautions reviewed, follow-up as scheduled.   ----- Ranae Plumberhelsea Ward, MD Attending Obstetrician and Gynecologist [facility], Department of OB/GYN [facility]

## 2016-06-06 ENCOUNTER — Encounter: Payer: Self-pay | Admitting: *Deleted

## 2016-06-06 ENCOUNTER — Inpatient Hospital Stay
Admission: EM | Admit: 2016-06-06 | Discharge: 2016-06-08 | DRG: 775 | Disposition: A | Discharge status: Home / Self Care | Payer: Medicaid Other | Attending: Obstetrics and Gynecology | Admitting: Obstetrics and Gynecology

## 2016-06-06 DIAGNOSIS — Z3A40 40 weeks gestation of pregnancy: Secondary | ICD-10-CM

## 2016-06-06 DIAGNOSIS — O365930 Maternal care for other known or suspected poor fetal growth, third trimester, not applicable or unspecified: Secondary | ICD-10-CM | POA: Diagnosis present

## 2016-06-06 DIAGNOSIS — O4202 Full-term premature rupture of membranes, onset of labor within 24 hours of rupture: Secondary | ICD-10-CM | POA: Diagnosis present

## 2016-06-06 DIAGNOSIS — Z3483 Encounter for supervision of other normal pregnancy, third trimester: Secondary | ICD-10-CM | POA: Diagnosis present

## 2016-06-06 DIAGNOSIS — D509 Iron deficiency anemia, unspecified: Secondary | ICD-10-CM | POA: Diagnosis present

## 2016-06-06 DIAGNOSIS — O9902 Anemia complicating childbirth: Secondary | ICD-10-CM | POA: Diagnosis present

## 2016-06-06 DIAGNOSIS — O99824 Streptococcus B carrier state complicating childbirth: Secondary | ICD-10-CM | POA: Diagnosis present

## 2016-06-06 DIAGNOSIS — O134 Gestational [pregnancy-induced] hypertension without significant proteinuria, complicating childbirth: Secondary | ICD-10-CM | POA: Diagnosis present

## 2016-06-06 MED ORDER — SODIUM CHLORIDE 0.9 % IV SOLN
INTRAVENOUS | Status: AC
  Administered 2016-06-06: 2 g via INTRAVENOUS
  Filled 2016-06-06: qty 2000

## 2016-06-06 NOTE — OB Triage Note (Signed)
Recvd from ED. C/o contractions every 7 min. Feeling baby move ok. No intercourse in the past 24 hours. No leaking of fluid.

## 2016-06-07 ENCOUNTER — Inpatient Hospital Stay: Payer: Medicaid Other | Admitting: Anesthesiology

## 2016-06-07 ENCOUNTER — Ambulatory Visit: Admission: RE | Admit: 2016-06-07 | Payer: Self-pay | Source: Ambulatory Visit

## 2016-06-07 ENCOUNTER — Encounter: Payer: Self-pay | Admitting: Anesthesiology

## 2016-06-07 LAB — CBC
HEMATOCRIT: 37.4 % (ref 35.0–47.0)
HEMOGLOBIN: 12.2 g/dL (ref 12.0–16.0)
MCH: 23.4 pg — ABNORMAL LOW (ref 26.0–34.0)
MCHC: 32.6 g/dL (ref 32.0–36.0)
MCV: 71.5 fL — ABNORMAL LOW (ref 80.0–100.0)
Platelets: 209 10*3/uL (ref 150–440)
RBC: 5.23 MIL/uL — AB (ref 3.80–5.20)
RDW: 31.9 % — AB (ref 11.5–14.5)
WBC: 12.3 10*3/uL — AB (ref 3.6–11.0)

## 2016-06-07 LAB — TYPE AND SCREEN
ABO/RH(D): O POS
ANTIBODY SCREEN: NEGATIVE

## 2016-06-07 MED ORDER — BUPIVACAINE HCL (PF) 0.25 % IJ SOLN
INTRAMUSCULAR | Status: DC | PRN
  Administered 2016-06-07: 8 mL via EPIDURAL

## 2016-06-07 MED ORDER — DIPHENHYDRAMINE HCL 25 MG PO CAPS: 25.0000 mg | ORAL_CAPSULE | Freq: Four times a day (QID) | ORAL | Status: DC | PRN

## 2016-06-07 MED ORDER — BISACODYL 10 MG RE SUPP: 10.0000 mg | Freq: Every day | RECTAL | Status: DC | PRN

## 2016-06-07 MED ORDER — EPHEDRINE SULFATE 50 MG/ML IJ SOLN
INTRAMUSCULAR | Status: DC | PRN
  Administered 2016-06-07: 15 mg via INTRAVENOUS

## 2016-06-07 MED ORDER — ONDANSETRON HCL 4 MG PO TABS: 4.0000 mg | ORAL_TABLET | ORAL | Status: DC | PRN

## 2016-06-07 MED ORDER — OXYTOCIN 40 UNITS IN LACTATED RINGERS INFUSION - SIMPLE MED
INTRAVENOUS | Status: AC
  Administered 2016-06-07: 1 m[IU]/min via INTRAVENOUS
  Filled 2016-06-07: qty 1000

## 2016-06-07 MED ORDER — TERBUTALINE SULFATE 1 MG/ML IJ SOLN
0.2500 mg | Freq: Once | INTRAMUSCULAR | Status: DC | PRN
  Filled 2016-06-07: qty 1

## 2016-06-07 MED ORDER — ZOLPIDEM TARTRATE 5 MG PO TABS: 5.0000 mg | ORAL_TABLET | Freq: Every evening | ORAL | Status: DC | PRN

## 2016-06-07 MED ORDER — PRENATAL MULTIVITAMIN CH
1.0000 | ORAL_TABLET | Freq: Every day | ORAL | Status: DC
Start: 2016-06-08 — End: 2016-06-08
  Administered 2016-06-08: 1 via ORAL
  Filled 2016-06-07: qty 1

## 2016-06-07 MED ORDER — LACTATED RINGERS IV SOLN
INTRAVENOUS | Status: DC
  Administered 2016-06-07: 10:00:00 via INTRAVENOUS

## 2016-06-07 MED ORDER — DIBUCAINE 1 % RE OINT: 1.0000 "application " | TOPICAL_OINTMENT | RECTAL | Status: DC | PRN

## 2016-06-07 MED ORDER — OXYTOCIN 40 UNITS IN LACTATED RINGERS INFUSION - SIMPLE MED
1.0000 m[IU]/min | INTRAVENOUS | Status: DC
  Administered 2016-06-07: 1 m[IU]/min via INTRAVENOUS

## 2016-06-07 MED ORDER — AMMONIA AROMATIC IN INHA
RESPIRATORY_TRACT | Status: AC
  Filled 2016-06-07: qty 10

## 2016-06-07 MED ORDER — COCONUT OIL OIL: 1.0000 "application " | TOPICAL_OIL | Status: DC | PRN

## 2016-06-07 MED ORDER — LIDOCAINE HCL (PF) 1 % IJ SOLN: 30.0000 mL | INTRAMUSCULAR | Status: DC | PRN

## 2016-06-07 MED ORDER — SODIUM CHLORIDE 0.9% FLUSH: 3.0000 mL | Freq: Two times a day (BID) | INTRAVENOUS | Status: DC

## 2016-06-07 MED ORDER — BENZOCAINE-MENTHOL 20-0.5 % EX AERO: 1.0000 "application " | INHALATION_SPRAY | CUTANEOUS | Status: DC | PRN

## 2016-06-07 MED ORDER — ONDANSETRON HCL 4 MG/2ML IJ SOLN: 4.0000 mg | INTRAMUSCULAR | Status: DC | PRN

## 2016-06-07 MED ORDER — SODIUM CHLORIDE 0.9% FLUSH: 3.0000 mL | INTRAVENOUS | Status: DC | PRN

## 2016-06-07 MED ORDER — TETANUS-DIPHTH-ACELL PERTUSSIS 5-2.5-18.5 LF-MCG/0.5 IM SUSP: 0.5000 mL | Freq: Once | INTRAMUSCULAR | Status: DC

## 2016-06-07 MED ORDER — MISOPROSTOL 200 MCG PO TABS
ORAL_TABLET | ORAL | Status: AC
  Filled 2016-06-07: qty 4

## 2016-06-07 MED ORDER — ACETAMINOPHEN 325 MG PO TABS
650.0000 mg | ORAL_TABLET | ORAL | Status: DC | PRN
  Filled 2016-06-07: qty 2

## 2016-06-07 MED ORDER — FENTANYL 2.5 MCG/ML W/ROPIVACAINE 0.2% IN NS 100 ML EPIDURAL INFUSION (ARMC-ANES)
EPIDURAL | Status: AC
  Filled 2016-06-07: qty 100

## 2016-06-07 MED ORDER — WITCH HAZEL-GLYCERIN EX PADS: 1.0000 "application " | MEDICATED_PAD | CUTANEOUS | Status: DC | PRN

## 2016-06-07 MED ORDER — SODIUM CHLORIDE 0.9 % IV SOLN: 250.0000 mL | INTRAVENOUS | Status: DC | PRN

## 2016-06-07 MED ORDER — FLEET ENEMA 7-19 GM/118ML RE ENEM: 1.0000 | ENEMA | Freq: Every day | RECTAL | Status: DC | PRN

## 2016-06-07 MED ORDER — SODIUM CHLORIDE 0.9 % IV SOLN
2.0000 g | Freq: Once | INTRAVENOUS | Status: AC
  Administered 2016-06-06: 2 g via INTRAVENOUS

## 2016-06-07 MED ORDER — LIDOCAINE-EPINEPHRINE (PF) 1.5 %-1:200000 IJ SOLN
INTRAMUSCULAR | Status: DC | PRN
  Administered 2016-06-07: 3 mL via PERINEURAL

## 2016-06-07 MED ORDER — SIMETHICONE 80 MG PO CHEW
80.0000 mg | CHEWABLE_TABLET | ORAL | Status: DC | PRN
Start: 2016-06-07 — End: 2016-06-08

## 2016-06-07 MED ORDER — SENNOSIDES-DOCUSATE SODIUM 8.6-50 MG PO TABS
2.0000 | ORAL_TABLET | ORAL | Status: DC
  Administered 2016-06-08: 2 via ORAL
  Filled 2016-06-07: qty 2

## 2016-06-07 MED ORDER — SODIUM CHLORIDE 0.9 % IV SOLN
1.0000 g | Freq: Four times a day (QID) | INTRAVENOUS | Status: DC
  Administered 2016-06-07 (×2): 1 g via INTRAVENOUS
  Filled 2016-06-07 (×8): qty 1000

## 2016-06-07 MED ORDER — ACETAMINOPHEN 325 MG PO TABS
650.0000 mg | ORAL_TABLET | ORAL | Status: DC | PRN
  Administered 2016-06-08: 650 mg via ORAL

## 2016-06-07 MED ORDER — BUTORPHANOL TARTRATE 1 MG/ML IJ SOLN
1.0000 mg | INTRAMUSCULAR | Status: DC | PRN
  Administered 2016-06-07: 1 mg via INTRAVENOUS
  Filled 2016-06-07 (×2): qty 1

## 2016-06-07 MED ORDER — SOD CITRATE-CITRIC ACID 500-334 MG/5ML PO SOLN
30.0000 mL | ORAL | Status: DC | PRN
  Filled 2016-06-07: qty 30

## 2016-06-07 MED ORDER — OXYTOCIN 10 UNIT/ML IJ SOLN
INTRAMUSCULAR | Status: AC
  Filled 2016-06-07: qty 2

## 2016-06-07 MED ORDER — ONDANSETRON HCL 4 MG/2ML IJ SOLN
4.0000 mg | Freq: Four times a day (QID) | INTRAMUSCULAR | Status: DC | PRN
  Administered 2016-06-07: 4 mg via INTRAVENOUS
  Filled 2016-06-07: qty 2

## 2016-06-07 MED ORDER — OXYTOCIN 40 UNITS IN LACTATED RINGERS INFUSION - SIMPLE MED
2.5000 [IU]/h | INTRAVENOUS | Status: DC
  Administered 2016-06-07 (×2): 2.5 [IU]/h via INTRAVENOUS

## 2016-06-07 MED ORDER — FENTANYL 2.5 MCG/ML W/ROPIVACAINE 0.2% IN NS 100 ML EPIDURAL INFUSION (ARMC-ANES)
EPIDURAL | Status: DC | PRN
  Administered 2016-06-07: 9 mL/h via EPIDURAL

## 2016-06-07 MED ORDER — LIDOCAINE HCL (PF) 1 % IJ SOLN
INTRAMUSCULAR | Status: AC
  Filled 2016-06-07: qty 30

## 2016-06-07 MED ORDER — OXYTOCIN BOLUS FROM INFUSION
500.0000 mL | Freq: Once | INTRAVENOUS | Status: AC
  Administered 2016-06-07: 500 mL via INTRAVENOUS

## 2016-06-07 MED ORDER — LACTATED RINGERS IV SOLN: 500.0000 mL | INTRAVENOUS | Status: DC | PRN

## 2016-06-07 MED ORDER — LIDOCAINE HCL (PF) 1 % IJ SOLN
INTRAMUSCULAR | Status: DC | PRN
  Administered 2016-06-07: 3 mL

## 2016-06-07 MED ORDER — IBUPROFEN 600 MG PO TABS
600.0000 mg | ORAL_TABLET | Freq: Four times a day (QID) | ORAL | Status: DC
  Administered 2016-06-07 – 2016-06-08 (×3): 600 mg via ORAL
  Filled 2016-06-07 (×4): qty 1

## 2016-06-07 MED ORDER — MEASLES, MUMPS & RUBELLA VAC ~~LOC~~ INJ
0.5000 mL | INJECTION | Freq: Once | SUBCUTANEOUS | Status: DC
  Filled 2016-06-07: qty 0.5

## 2016-06-07 NOTE — H&P (Signed)
OB ADMISSION/ HISTORY & PHYSICAL:  Admission Date: 06/06/2016  7:50 PM  Admit Diagnosis: 40 weeks Indication for labor and delivery  Pamela Cole is a 20 y.o. femaleG2P1 at 40 weeks presenting for labor contractions and bloody show.  She reports her membranes were stripped in the office today and she was 3-4 cm.  AFI yesterday 6.1cm.   Prenatal History: G2P1   EDC : 06/06/2016, by Last Menstrual Period 08/31/15 Prenatal care at [facility] Prenatal course complicated by Low normal AFI, UTI, GBS positive, proteinuria, IDA, hx. Of IUGR  Prenatal Labs: ABO, Rh: --/--/O POS (10/31 2239) Antibody: NEG (10/31 2239) Rubella:   Immune Varicella: Immune RPR:   NR HBsAg:   Negative HIV:   Negative GTT: 81 GBS:   POSITIVE   Medical / Surgical History :  Past medical history:  Past Medical History:  Diagnosis Date  . Anemia      Past surgical history:  Past Surgical History:  Procedure Laterality Date  . NO PAST SURGERIES      Family History: History reviewed. No pertinent family history.   Social History:  reports that she has never smoked. She has never used smokeless tobacco. She reports that she does not drink alcohol or use drugs.   Allergies: Review of patient's allergies indicates no known allergies.    Current Medications at time of admission:  Prior to Admission medications   Medication Sig Start Date End Date Taking? Authorizing Provider  ferrous sulfate 325 (65 FE) MG tablet Take 325 mg by mouth 3 (three) times daily with meals.   Yes Historical Provider, MD  Prenatal Vit-Fe Fumarate-FA (MULTIVITAMIN-PRENATAL) 27-0.8 MG TABS tablet Take 1 tablet by mouth daily at 12 noon.   Yes Historical Provider, MD  metroNIDAZOLE (FLAGYL) 500 MG tablet Take 1 tablet (500 mg total) by mouth 2 (two) times daily. Patient not taking: Reported on 06/01/2016 05/25/16   Pamela Cole, CNM  nitrofurantoin, macrocrystal-monohydrate, (MACROBID) 100 MG capsule Take 1 capsule  (100 mg total) by mouth 2 (two) times daily. Patient not taking: Reported on 06/01/2016 05/25/16   Pamela Cole, CNM     Review of Systems: Active FM onset of ctx @ 1500 currently every 7 minutes No LOF  / SROM  Reports bloody show Low back pain   Physical Exam:  VS: Blood pressure 113/78, pulse 83, temperature 98.5 F (36.9 C), resp. rate 16, last menstrual period 08/31/2015.  General: alert and oriented, appears  Uncomfortable  Heart: RRR Lungs: Clear lung fields Abdomen: Gravid, soft and non-tender, non-distended / uterus: gravid, non-tender Extremities: no edema  Genitalia / VE: Dilation: 3.5 Effacement (%): 80 Station: 0 Exam by:: MBS  FHR: baseline rate 145 bpm / variability moderate / accelerations + / no decelerations TOCO: every 5-9.5 minutes  Assessment: [redacted] weeks gestation Induction stage of labor FHR category 1 GBS Positive    Plan:  [facility]. [facility] for advanced cervical dilation, GBS positive, and Low AFI     - Begin Pitocin 1 milliunit and increase by 2 milliunits       - Epidural upon request   2. GBS Positive      - Ampicillin 2 grams x 1 dose, then 1 gram every 6 hours  3. Postpartum:      - Breast feeding     - Contraception: Depo 4. Anticipate NSVD     - Previous delivery IUGR  Dr. Feliberto GottronSchermerhorn notified of admission / plan  of care  Pamela Cole, CNM

## 2016-06-07 NOTE — Lactation Note (Signed)
This note was copied from a baby's chart. Lactation Consultation Note  Patient Name: Pamela Cole JYNWG'NToday's Date: 06/07/2016 Reason for consult: Initial assessment;Difficult latch   Maternal Data  P2 Mom states she had to pump/bo other child due to flat nipple/no latch. Both nipples are quite flat and telescope inward with compression. I taught her hand expression (no colostrum); then DEBP x 15 minutes. Nipples did evert more than I thought they could. I was then able to apply 24 mm nipple shield. After some fussing, Thyra Breednrique finally did latch and develop rhythmic sucking and swallowing with some breast compression. I brought Mom Breast shells to apply once she can get a bra on. PLAN: wear shells in bra ASAP between feeds; pump to evert nipples prior to applying NS for feeding. Nurse per cues...trying at least every 2-3 hours for now. If ever unable to latch and nurse baby, then pump 15 minutes and syringe, spoon feed colostrum This Mom planned to give both formula and breast milk. Trying to delay formula as long as he can BF well, etc. [facility] she will need to use a DEBP at home, most likely.   Feeding    LATCH Score/Interventions Latch: Repeated attempts needed to sustain latch, nipple held in mouth throughout feeding, stimulation needed to elicit sucking reflex. (with 24 mm NS after pumping 25 minutes) Intervention(s): Adjust position;Assist with latch;Breast massage  Audible Swallowing: A few with stimulation Intervention(s): Hand expression;Skin to skin  Type of Nipple: Flat (both nipples flat; telescope in with compression) Intervention(s): Reverse pressure;Shells;Double electric pump Intervention(s): Double electric pump  Comfort (Breast/Nipple): Soft / non-tender     Hold (Positioning): Assistance needed to correctly position infant at breast and maintain latch. Intervention(s): Support Pillows  LATCH Score: 6  Lactation Tools  Discussed/Used WIC Program:  (maybe; I contacted WIC to F/U) Pump Review: Setup, frequency, and cleaning (sleepy mom will need reminder) Initiated by:: Kaydn Kumpf Date initiated:: 06/07/16   Consult Status Consult Status: Follow-up Date: 06/08/16 Follow-up type: In-patient    Sunday CornSandra Clark Prisha Hiley 06/07/2016, 2:42 PM

## 2016-06-07 NOTE — Anesthesia Procedure Notes (Signed)
Epidural  Start time: 06/07/2016 5:28 AM End time: 06/07/2016 5:36 AM  Staffing Anesthesiologist: Yves DillARROLL, Brave Dack Performed: anesthesiologist   Preanesthetic Checklist Completed: patient identified, site marked, surgical consent, pre-op evaluation, timeout performed, IV checked, risks and benefits discussed and monitors and equipment checked  Epidural Patient position: sitting Prep: Betadine and site prepped and draped Patient monitoring: heart rate, cardiac monitor, continuous pulse ox and blood pressure Approach: midline Location: L3-L4 Injection technique: LOR air  Needle:  Needle type: Tuohy  Needle gauge: 18 G Needle length: 9 cm Needle insertion depth: 5 cm Catheter type: closed end Catheter size: 20 Guage Test dose: negative and 1.5% lidocaine with Epi 1:200 K  Additional Notes Time out called.  Patient placed in sitting position.  Back prepped and draped in sterile fashion.  A skin wheal was made in the L3-L4 interspace with 1% Lidocaine plain.  An 18G Tuohy needle was advanced into the epidural space by a loss of resistance technique.  The epidural catheter was threaded 3cm into the epidural space easily with a negative test dose.  No blood or paresthesias.  The catheter was affixed to the back in a sterile fashion.  The patient tolerated the procedure well.

## 2016-06-07 NOTE — Discharge Summary (Signed)
Obstetric Discharge Summary Reason for Admission: IOL for IUGR which was improved to 19%, low normal AFI, Gest HTN,  Prenatal Procedures: US Intrapartum Procedures: Fetal and uterine monitors  Postpartum Procedures: None Complications-Operative and Postpartum:  Hemoglobin  Date Value Ref Range Status  06/06/2016 12.2 12.0 - 16.0 g/dL Final   HGB  Date Value Ref Range Status  08/21/2013 7.3 (L) 12.0 - 16.0 g/dL Final   HCT  Date Value Ref Range Status  06/06/2016 37.4 35.0 - 47.0 % Final  08/24/2013 20.1 (L) 35.0 - 47.0 % Final   hct 06/08/16 = 32.1  Physical Exam:  General: A.A&O x 3 Lochia:mod, no clots Uterine Fundus: FF, U- Perineum 1st degree w/cerrvical microscopic bleed x 1 stitch DVT Evaluation: Neg Homans  Discharge Diagnoses: IOL at term with NSVD of viable female infant with 1st degree perineal lac and 1 stitch to microscopic bleeder from cx. Pt stable for discharge  Discharge Information: Date: 06/07/2016 Activity:Up ad lib Diet: Regular Medications:Fe, Ibuprofen, PNV, Depo Provera 150 mg given before d/c  Condition: stable  Instructions: No sex x 6 weeks, no driving for 2 weeks, FU in 6 weeks Discharge to: Home    Newborn Data: Live born female  Birth Weight: 7 lb 2.6 oz (3250 g) APGAR: 8, 9  Home with Mom  Sharee PimpleCaron W Jones 06/07/2016, 6:20 PM

## 2016-06-07 NOTE — Progress Notes (Signed)
S:  Pt. Comfortable with epidural and SROM for clear fluid at 0605     Some nausea/vomiting   O:  VS: Blood pressure 126/67, pulse 66, temperature 98.5 F (36.9 C), resp. rate 16, last menstrual period 08/31/2015, SpO2 100 %.        FHR : baseline 120 bpm / variability moderaet / accelerations + / no decelerations        Toco: contractions every 1-4 minutes / moderate-strong         Cervix : Dilation: 5.5 Effacement (%): 80 Station: 0 Presentation: Vertex Exam by:: MBS        Membranes: SROM - clear fluid  A: Active abor     FHR category 1  P: Continue expectant management      GBS Pos s/p treatment with Ampicillin       Anticipate NSVD  Carlean JewsMeredith Natthew Marlatt, CNM

## 2016-06-07 NOTE — Anesthesia Preprocedure Evaluation (Signed)
Anesthesia Evaluation  Patient identified by MRN, date of birth, ID band Patient awake    Reviewed: Allergy & Precautions, NPO status , Patient's Chart, lab work & pertinent test results  Airway Mallampati: II  TM Distance: >3 FB     Dental   Pulmonary neg pulmonary ROS,    Pulmonary exam normal        Cardiovascular negative cardio ROS Normal cardiovascular exam     Neuro/Psych negative neurological ROS  negative psych ROS   GI/Hepatic negative GI ROS, Neg liver ROS,   Endo/Other  negative endocrine ROS  Renal/GU negative Renal ROS  negative genitourinary   Musculoskeletal negative musculoskeletal ROS (+)   Abdominal Normal abdominal exam  (+)   Peds negative pediatric ROS (+)  Hematology  (+) anemia ,   Anesthesia Other Findings   Reproductive/Obstetrics (+) Pregnancy                             Anesthesia Physical Anesthesia Plan  ASA: II  Anesthesia Plan: Epidural   Post-op Pain Management:    Induction:   Airway Management Planned: Natural Airway  Additional Equipment:   Intra-op Plan:   Post-operative Plan:   Informed Consent: I have reviewed the patients History and Physical, chart, labs and discussed the procedure including the risks, benefits and alternatives for the proposed anesthesia with the patient or authorized representative who has indicated his/her understanding and acceptance.   Dental advisory given  Plan Discussed with: CRNA and Surgeon  Anesthesia Plan Comments:         Anesthesia Quick Evaluation

## 2016-06-07 NOTE — Progress Notes (Signed)
Pamela Cole is a 20 y.o. G2P1 at 356w1d for IOL due to IUGR of 19%, low normal AFI.  Subjective: "no complaints"   Objective: BP (!) 98/51   Pulse 67   Temp 97.9 F (36.6 C) (Oral)   Resp 16   Ht 4\' 11"  (1.499 m)   Wt 61.7 kg (136 lb)   LMP 08/31/2015   SpO2 98%   BMI 27.47 kg/m  No intake/output data recorded. No intake/output data recorded.  FHT:   UC:  SVE:   Dilation: 7 Effacement (%): 90 Station: 0 Exam by:: jac  Labs: Lab Results  Component Value Date   WBC 12.3 (H) 06/06/2016   HGB 12.2 06/06/2016   HCT 37.4 06/06/2016   MCV 71.5 (L) 06/06/2016   PLT 209 06/06/2016    Assessment / Plan:   Labor: progressing  Preeclampsia:  none Fetal Wellbeing: Cat 1, +accels, no decels, mod variability Pain Control: Epidural  Anticipated MOD:  GBS pos: 2 doses of antibiotics completed Pitocin per protocol  Sharee Pimplearon W Aryanah Enslow 06/07/2016, 9:33 AM

## 2016-06-08 LAB — CBC
HEMATOCRIT: 32.1 % — AB (ref 35.0–47.0)
HEMOGLOBIN: 10.3 g/dL — AB (ref 12.0–16.0)
MCH: 22.7 pg — ABNORMAL LOW (ref 26.0–34.0)
MCHC: 32.2 g/dL (ref 32.0–36.0)
MCV: 70.5 fL — AB (ref 80.0–100.0)
Platelets: 186 10*3/uL (ref 150–440)
RBC: 4.56 MIL/uL (ref 3.80–5.20)
RDW: 32.7 % — ABNORMAL HIGH (ref 11.5–14.5)
WBC: 11.4 10*3/uL — AB (ref 3.6–11.0)

## 2016-06-08 LAB — CHLAMYDIA/NGC RT PCR (ARMC ONLY)
Chlamydia Tr: NOT DETECTED
N GONORRHOEAE: NOT DETECTED

## 2016-06-08 LAB — RPR: RPR Ser Ql: NONREACTIVE

## 2016-06-08 MED ORDER — FENTANYL 2.5 MCG/ML W/ROPIVACAINE 0.2% IN NS 100 ML EPIDURAL INFUSION (ARMC-ANES): 9.0000 mL/h | EPIDURAL | Status: DC

## 2016-06-08 MED ORDER — LACTATED RINGERS IV SOLN: 500.0000 mL | Freq: Once | INTRAVENOUS | Status: DC

## 2016-06-08 MED ORDER — DIPHENHYDRAMINE HCL 50 MG/ML IJ SOLN: 12.5000 mg | INTRAMUSCULAR | Status: DC | PRN

## 2016-06-08 MED ORDER — PHENYLEPHRINE 40 MCG/ML (10ML) SYRINGE FOR IV PUSH (FOR BLOOD PRESSURE SUPPORT)
80.0000 ug | PREFILLED_SYRINGE | INTRAVENOUS | Status: DC | PRN
Start: 2016-06-08 — End: 2016-06-08

## 2016-06-08 MED ORDER — PHENYLEPHRINE 40 MCG/ML (10ML) SYRINGE FOR IV PUSH (FOR BLOOD PRESSURE SUPPORT): 80.0000 ug | PREFILLED_SYRINGE | INTRAVENOUS | Status: DC | PRN

## 2016-06-08 MED ORDER — IBUPROFEN 600 MG PO TABS: 600.0000 mg | ORAL_TABLET | Freq: Four times a day (QID) | ORAL | 0 refills | Status: DC

## 2016-06-08 MED ORDER — MEDROXYPROGESTERONE ACETATE 150 MG/ML IM SUSP: 150.0000 mg | Freq: Once | INTRAMUSCULAR | 1 refills | Status: DC

## 2016-06-08 MED ORDER — EPHEDRINE 5 MG/ML INJ: 10.0000 mg | INTRAVENOUS | Status: DC | PRN

## 2016-06-08 MED ORDER — EPHEDRINE 5 MG/ML INJ
10.0000 mg | INTRAVENOUS | Status: DC | PRN
Start: 2016-06-08 — End: 2016-06-08

## 2016-06-08 MED ORDER — BENZOCAINE-MENTHOL 20-0.5 % EX AERO: 1.0000 "application " | INHALATION_SPRAY | CUTANEOUS | 1 refills | Status: DC | PRN

## 2016-06-08 NOTE — Lactation Note (Addendum)
This note was copied from a baby's chart. Lactation Consultation Note  Patient Name: Pamela Cole Today's Date: 06/08/2016     Maternal Data  Even though mom has been wearing breast shells to evert nipples and baby is capable of breastfeedign well with nipple shield, she states she really prefers to just pump and bottle feed. We had already discussed the differences between that and direct feeding before she made her decision. I had already informed [facility] of her need for a breast pump. I encouraged Mom to make sure she has contact with them before discharge. Pumping plan discussed as well.   Feeding    LATCH Score/Interventions                      Lactation Tools Discussed/Used     Consult Status      Sunday CornSandra Clark Joseline Mccampbell 06/08/2016, 10:59 AM

## 2016-06-08 NOTE — Progress Notes (Signed)
D/C order from MD.  Reviewed d/c instructions and prescriptions with patient and answered any questions.  Patient d/c home with infant via wheelchair by nursing/auxillary. 

## 2016-06-08 NOTE — Anesthesia Postprocedure Evaluation (Signed)
Anesthesia Post Note  Patient: Pamela Cole  Procedure(s) Performed: * No procedures listed *  Patient location during evaluation: Mother Baby Anesthesia Type: Epidural Level of consciousness: awake and alert Vital Signs Assessment: post-procedure vital signs reviewed and stable Respiratory status: spontaneous breathing Cardiovascular status: stable Anesthetic complications: no    Last Vitals:  Vitals:   06/08/16 0002 06/08/16 0453  BP: 103/68 102/64  Pulse: 77 65  Resp: 18 18  Temp: 37.2 C 37.1 C    Last Pain:  Vitals:   06/08/16 0656  TempSrc:   PainSc: Asleep                 [name],  Alessandra BevelsJennifer M

## 2016-06-08 NOTE — Discharge Instructions (Signed)
Please call your doctor or return to the ER if you experience any chest pains, shortness of breath, fever greater than 101, any heavy bleeding or large clots, and foul smelling vaginal discharge, any worsening abdominal pain & cramping that is not controlled by pain medication, or any signs of post partum depression.  No tampons, enemas, douches, or sexual intercourse for 6 weeks.  Also avoid tub baths, hot tubs, or swimming for 6 weeks. ° °Care After Vaginal Delivery °Congratulations on your new baby!! ° °Refer to this sheet in the next few weeks. These discharge instructions provide you with information on caring for yourself after delivery. Your caregiver may also give you specific instructions. Your treatment has been planned according to the most current medical practices available, but problems sometimes occur. Call your caregiver if you have any problems or questions after you go home. ° °HOME CARE INSTRUCTIONS °· Take over-the-counter or prescription medicines only as directed by your caregiver or pharmacist. °· Do not drink alcohol, especially if you are breastfeeding or taking medicine to relieve pain. °· Do not chew or smoke tobacco. °· Do not use illegal drugs. °· Continue to use good perineal care. Good perineal care includes: °¨ Wiping your perineum from front to back. °¨ Keeping your perineum clean. °· Do not use tampons or douche until your caregiver says it is okay. °· Shower, wash your hair, and take tub baths as directed by your caregiver. °· Wear a well-fitting bra that provides breast support. °· Eat healthy foods. °· Drink enough fluids to keep your urine clear or pale yellow. °· Eat high-fiber foods such as whole grain cereals and breads, brown rice, beans, and fresh fruits and vegetables every day. These foods may help prevent or relieve constipation. °· Follow your caregiver's recommendations regarding resumption of activities such as climbing stairs, driving, lifting, exercising, or  traveling. Specifically, no driving for two weeks, so that you are comfortable reacting quickly in an emergency. °· Talk to your caregiver about resuming sexual activities. Resumption of sexual activities is dependent upon your risk of infection, your rate of healing, and your comfort and desire to resume sexual activity. Usually we recommend waiting about six weeks, or until your bleeding stops and you are interested in sex. °· Try to have someone help you with your household activities and your newborn for at least a few days after you leave the hospital. Even longer is better. °· Rest as much as possible. Try to rest or take a nap when your newborn is sleeping. Sleep deprivation can be very hard after delivery. °· Increase your activities gradually. °· Keep all of your scheduled postpartum appointments. It is very important to keep your scheduled follow-up appointments. At these appointments, your caregiver will be checking to make sure that you are healing physically and emotionally. ° °SEEK MEDICAL CARE IF:  °· You are passing large clots from your vagina.  °· You have a foul smelling discharge from your vagina. °· You have trouble urinating. °· You are urinating frequently. °· You have pain when you urinate. °· You have a change in your bowel movements. °· You have increasing redness, pain, or swelling near your vaginal incision (episiotomy) or vaginal tear. °· You have pus draining from your episiotomy or vaginal tear. °· Your episiotomy or vaginal tear is separating. °· You have painful, hard, or reddened breasts. °· You have a severe headache. °· You have blurred vision or see spots. °· You feel sad or depressed. °· You   have thoughts of hurting yourself or your newborn. °· You have questions about your care, the care of your newborn, or medicines. °· You are dizzy or light-headed. °· You have a rash. °· You have nausea or vomiting. °· You were breastfeeding and have not had a menstrual period within 12  weeks after you stopped breastfeeding. °· You are not breastfeeding and have not had a menstrual period by the 12th week after delivery. °· You have a fever. ° °SEEK IMMEDIATE MEDICAL CARE IF:  °· You have persistent pain. °· You have chest pain. °· You have shortness of breath. °· You faint. °· You have leg pain. °· You have stomach pain. °· Your vaginal bleeding saturates two or more sanitary pads in 1 hour. ° °MAKE SURE YOU:  °· Understand these instructions. °· Will get help right away if you are not doing well or get worse. °·  °Document Released: 07/21/2000 Document Revised: 12/08/2013 Document Reviewed: 03/20/2012 ° °[employer]® Patient Information ©2015 [employer]. This information is not intended to replace advice given to you by your health care provider. Make sure you discuss any questions you have with your health care provider. ° °

## 2016-06-09 LAB — RUBELLA SCREEN

## 2016-06-09 LAB — VARICELLA ZOSTER ANTIBODY, IGG: Varicella IgG: <135 index — ABNORMAL LOW (ref >165)

## 2016-06-14 ENCOUNTER — Emergency Department
Admission: EM | Admit: 2016-06-14 | Discharge: 2016-06-14 | Disposition: A | Discharge status: Home / Self Care | Payer: Self-pay | Attending: Emergency Medicine | Admitting: Emergency Medicine

## 2016-06-14 ENCOUNTER — Encounter: Payer: Self-pay | Admitting: Emergency Medicine

## 2016-06-14 ENCOUNTER — Emergency Department: Payer: Self-pay

## 2016-06-14 DIAGNOSIS — R102 Pelvic and perineal pain: Secondary | ICD-10-CM | POA: Insufficient documentation

## 2016-06-14 DIAGNOSIS — Z791 Long term (current) use of non-steroidal anti-inflammatories (NSAID): Secondary | ICD-10-CM | POA: Insufficient documentation

## 2016-06-14 DIAGNOSIS — N939 Abnormal uterine and vaginal bleeding, unspecified: Secondary | ICD-10-CM

## 2016-06-14 LAB — CHLAMYDIA/NGC RT PCR (ARMC ONLY)
Chlamydia Tr: NOT DETECTED
N gonorrhoeae: NOT DETECTED

## 2016-06-14 LAB — COMPREHENSIVE METABOLIC PANEL
ALK PHOS: 109 U/L (ref 38–126)
ALT: 33 U/L (ref 14–54)
AST: 32 U/L (ref 15–41)
Albumin: 3.7 g/dL (ref 3.5–5.0)
Anion gap: 7 (ref 5–15)
BILIRUBIN TOTAL: 0.5 mg/dL (ref 0.3–1.2)
BUN: 8 mg/dL (ref 6–20)
CALCIUM: 9 mg/dL (ref 8.9–10.3)
CO2: 24 mmol/L (ref 22–32)
CREATININE: 0.46 mg/dL (ref 0.44–1.00)
Chloride: 105 mmol/L (ref 101–111)
Glucose, Bld: 91 mg/dL (ref 65–99)
Potassium: 3.6 mmol/L (ref 3.5–5.1)
Sodium: 136 mmol/L (ref 135–145)
TOTAL PROTEIN: 7.5 g/dL (ref 6.5–8.1)

## 2016-06-14 LAB — WET PREP, GENITAL
CLUE CELLS WET PREP: NONE SEEN
Sperm: NONE SEEN
TRICH WET PREP: NONE SEEN
Yeast Wet Prep HPF POC: NONE SEEN

## 2016-06-14 LAB — CBC
HEMATOCRIT: 38.4 % (ref 35.0–47.0)
Hemoglobin: 12.1 g/dL (ref 12.0–16.0)
MCH: 22.7 pg — AB (ref 26.0–34.0)
MCHC: 31.4 g/dL — ABNORMAL LOW (ref 32.0–36.0)
MCV: 72.3 fL — AB (ref 80.0–100.0)
Platelets: 346 10*3/uL (ref 150–440)
RBC: 5.32 MIL/uL — AB (ref 3.80–5.20)
RDW: 30.6 % — ABNORMAL HIGH (ref 11.5–14.5)
WBC: 13.5 10*3/uL — AB (ref 3.6–11.0)

## 2016-06-14 LAB — HCG, QUANTITATIVE, PREGNANCY: HCG, BETA CHAIN, QUANT, S: 19 m[IU]/mL — AB (ref <5)

## 2016-06-14 MED ORDER — ONDANSETRON 4 MG PO TBDP
ORAL_TABLET | ORAL | Status: AC
  Administered 2016-06-14: 4 mg via ORAL
  Filled 2016-06-14: qty 1

## 2016-06-14 MED ORDER — OXYCODONE-ACETAMINOPHEN 5-325 MG PO TABS
2.0000 | ORAL_TABLET | Freq: Once | ORAL | Status: AC
  Administered 2016-06-14: 2 via ORAL

## 2016-06-14 MED ORDER — AMOXICILLIN-POT CLAVULANATE 875-125 MG PO TABS
1.0000 | ORAL_TABLET | Freq: Once | ORAL | Status: AC
  Administered 2016-06-14: 1 via ORAL
  Filled 2016-06-14: qty 1

## 2016-06-14 MED ORDER — OXYCODONE-ACETAMINOPHEN 5-325 MG PO TABS
ORAL_TABLET | ORAL | Status: AC
  Filled 2016-06-14: qty 2

## 2016-06-14 MED ORDER — AMOXICILLIN-POT CLAVULANATE 875-125 MG PO TABS: 1.0000 | ORAL_TABLET | Freq: Two times a day (BID) | ORAL | 0 refills | Status: AC

## 2016-06-14 MED ORDER — IBUPROFEN 800 MG PO TABS: 800.0000 mg | ORAL_TABLET | Freq: Three times a day (TID) | ORAL | 0 refills | Status: DC | PRN

## 2016-06-14 MED ORDER — ONDANSETRON 4 MG PO TBDP
4.0000 mg | ORAL_TABLET | Freq: Once | ORAL | Status: AC
  Administered 2016-06-14: 4 mg via ORAL

## 2016-06-14 NOTE — ED Triage Notes (Addendum)
Patient ambulatory to triage with steady gait, without difficulty or distress noted; pt reports vag bleeding today with lower back pain, lower abd cramping and frontal HA; post vag delivery week ago with no complications

## 2016-06-14 NOTE — ED Provider Notes (Addendum)
[facility] Emergency Department Provider Note        Time seen: ----------------------------------------- 8:21 PM on 06/14/2016 -----------------------------------------    I have reviewed the triage vital signs and the nursing notes.   HISTORY  Chief Complaint No chief complaint on file.    HPI Pamela Cole is a 20 y.o. female who presents to the ER for vaginal bleeding today with lower back pain and abdominal cramping and some headache. Patient states she had a vaginal delivery week ago with no complications, over the last 24 hours she's developed a bleeding and pain. She denies fevers, chills or other complaints.   Past Medical History:  Diagnosis Date  . Anemia     Patient Active Problem List   Diagnosis Date Noted  . Labor and delivery, indication for care 06/07/2016  . Indication for care in labor and delivery, delivered 06/07/2016  . Supervision of high risk pregnancy in third trimester 06/01/2016  . IUGR (intrauterine growth restriction) affecting care of mother, third trimester, other fetus 05/29/2016  . Oligohydramnios antepartum 05/29/2016  . Pregnancy 05/25/2016  . Indication for care in labor or delivery 04/28/2016  . Indication for care in labor and delivery, antepartum 04/17/2016  . Anemia affecting pregnancy in third trimester 04/17/2016  . Abdominal pain affecting pregnancy 04/01/2016    Past Surgical History:  Procedure Laterality Date  . NO PAST SURGERIES      Allergies Patient has no known allergies.  Social History Social History  Substance Use Topics  . Smoking status: Never Smoker  . Smokeless tobacco: Never Used  . Alcohol use No    Review of Systems Constitutional: Negative for fever. Cardiovascular: Negative for chest pain. Respiratory: Negative for shortness of breath. Gastrointestinal: Positive for abdominal pain Genitourinary: Negative for dysuria. positive for vaginal  bleeding Musculoskeletal: Negative for back pain. Skin: Negative for rash. Neurological: Negative for headaches, focal weakness or numbness.  10-point ROS otherwise negative.  ____________________________________________   PHYSICAL EXAM:  VITAL SIGNS: ED Triage Vitals [06/14/16 2008]  Enc Vitals Group     BP 116/79     Pulse Rate 95     Resp 18     Temp 98.4 F (36.9 C)     Temp Source Oral     SpO2 99 %     Weight 130 lb (59 kg)     Height 4\' 11"  (1.499 m)     Head Circumference      Peak Flow      Pain Score 9     Pain Loc      Pain Edu?      Excl. in GC?     Constitutional: Alert and oriented. Well appearing and in no distress. Eyes: Conjunctivae are normal. PERRL. Normal extraocular movements. ENT   Head: Normocephalic and atraumatic.   Nose: No congestion/rhinnorhea.   Mouth/Throat: Mucous membranes are moist.   Neck: No stridor. Cardiovascular: Normal rate, regular rhythm. No murmurs, rubs, or gallops. Respiratory: Normal respiratory effort without tachypnea nor retractions. Breath sounds are clear and equal bilaterally. No wheezes/rales/rhonchi. Gastrointestinal: Suprapubic and pelvic tenderness, no rebound or guarding. Normal bowel sounds. Genitourinary: Dark vaginal blood is present, tenderness on examination Musculoskeletal: Nontender with normal range of motion in all extremities. No lower extremity tenderness nor edema. Neurologic:  Normal speech and language. No gross focal neurologic deficits are appreciated.  Skin:  Skin is warm, dry and intact. No rash noted. Psychiatric: Mood and affect are normal. Speech and behavior are  normal.  ____________________________________________  ED COURSE:  Pertinent labs & imaging results that were available during my care of the patient were reviewed by me and considered in my medical decision making (see chart for details). Clinical Course   Patient presents in no acute distress, we will assess with  labs and ultrasound.  Procedures ____________________________________________   LABS (pertinent positives/negatives)  Labs Reviewed  WET PREP, GENITAL - Abnormal; Notable for the following:       Result Value   WBC, Wet Prep HPF POC MANY (*)    All other components within normal limits  CBC - Abnormal; Notable for the following:    WBC 13.5 (*)    RBC 5.32 (*)    MCV 72.3 (*)    MCH 22.7 (*)    MCHC 31.4 (*)    RDW 30.6 (*)    All other components within normal limits  HCG, QUANTITATIVE, PREGNANCY - Abnormal; Notable for the following:    hCG, Beta Chain, Quant, S 19 (*)    All other components within normal limits  CHLAMYDIA/NGC RT PCR ([facility] ONLY)  COMPREHENSIVE METABOLIC PANEL  URINALYSIS COMPLETEWITH MICROSCOPIC ([facility] ONLY)    RADIOLOGY  Pelvic ultrasound IMPRESSION: Enlarged uterus consistent with postpartum state. Heterogeneous fluid in the endometrium without significant thickening. This likely represents blood clots. Absence of flow indicates less likely to be retained products of conception. ____________________________________________  FINAL ASSESSMENT AND PLAN  Pelvic pain, postpartum bleeding  Plan: Patient with labs and imaging as dictated above. Postpartum ultrasound is reassuring. I will place her on antibiotics as a precaution. She will be encouraged to have close follow-up with Dr.Schermerhorn for recheck in the next 1-2 days.    Emily FilbertWilliams, Jonathan E, MD   Note: This dictation was prepared with Dragon dictation. Any transcriptional errors that result from this process are unintentional    Emily FilbertJonathan E Williams, MD 06/14/16 2207    Emily FilbertJonathan E Williams, MD 06/14/16 2211

## 2016-09-24 DIAGNOSIS — D509 Iron deficiency anemia, unspecified: Secondary | ICD-10-CM | POA: Insufficient documentation

## 2016-09-24 NOTE — Progress Notes (Signed)
[facility], [facility]  Telephone:(336) [phone] Fax:(336) [phone]  ID: Craig StaggersIris Jhoana Cole Cole OB: [birth year]  MR#: [id number]  XBM#:[id number]#:[id number]  Patient Care Team: [facility] as PCP - General  CHIEF COMPLAINT: Iron deficiency anemia.  INTERVAL HISTORY: Patient returns to clinic today for repeat laboratory work and further evaluation. She had no reported problems with her delivery several months ago. She currently feels well and is asymptomatic. She does not complain of weakness or fatigue. She has no neurologic complaints. She denies any recent fevers or illnesses. She has a good appetite. She has no chest pain or shortness of breath. She denies any nausea, vomiting, constipation, or diarrhea. She has no urinary complaints. Patient feels at her baseline and offers no specific complaints today.  REVIEW OF SYSTEMS:   Review of Systems  Constitutional: Negative.  Negative for fever, malaise/fatigue and weight loss.  Respiratory: Negative.  Negative for cough and shortness of breath.   Cardiovascular: Negative.  Negative for chest pain and leg swelling.  Gastrointestinal: Negative.  Negative for abdominal pain, blood in stool and melena.  Genitourinary: Negative.   Musculoskeletal: Negative.   Neurological: Negative.  Negative for weakness.  Psychiatric/Behavioral: Negative.  The patient is not nervous/anxious.     As per HPI. Otherwise, a complete review of systems is negative.  PAST MEDICAL HISTORY: Past Medical History:  Diagnosis Date  . Anemia     PAST SURGICAL HISTORY: Past Surgical History:  Procedure Laterality Date  . NO PAST SURGERIES      FAMILY HISTORY: Reviewed and unchanged. No reported history of malignancy or chronic disease.  ADVANCED DIRECTIVES (Y/N):  N  HEALTH MAINTENANCE: Social History  Substance Use Topics  . Smoking status: Never Smoker  . Smokeless tobacco: Never Used  . Alcohol use No      Colonoscopy:  PAP:  Bone density:  Lipid panel:  No Known Allergies  Current Outpatient Prescriptions  Medication Sig Dispense Refill  . ferrous sulfate 325 (65 FE) MG tablet Take 325 mg by mouth 2 (two) times daily with a meal.     . ibuprofen (ADVIL,MOTRIN) 600 MG tablet Take 1 tablet (600 mg total) by mouth every 6 (six) hours. (Patient not taking: Reported on 09/25/2016) 30 tablet 0  . medroxyPROGESTERone (DEPO-PROVERA) 150 MG/ML injection Inject 1 mL (150 mg total) into the muscle once. 1 mL 1   No current facility-administered medications for this visit.     OBJECTIVE: Vitals:   09/25/16 1414  BP: 104/69  Pulse: 78  Resp: 18  Temp: 98.8 F (37.1 C)     Body mass index is 25.34 kg/m.    ECOG FS:0 - Asymptomatic  General: Well-developed, well-nourished, no acute distress. Eyes: Pink conjunctiva, anicteric sclera. Lungs: Clear to auscultation bilaterally. Heart: Regular rate and rhythm. No rubs, murmurs, or gallops. Abdomen: Appears appropriate for gestational age.  Musculoskeletal: No edema, cyanosis, or clubbing. Neuro: Alert, answering all questions appropriately. Cranial nerves grossly intact. Skin: No rashes or petechiae noted. Psych: Normal affect.   LAB RESULTS:  Lab Results  Component Value Date   NA 136 06/14/2016   K 3.6 06/14/2016   CL 105 06/14/2016   CO2 24 06/14/2016   GLUCOSE 91 06/14/2016   BUN 8 06/14/2016   CREATININE 0.46 06/14/2016   CALCIUM 9.0 06/14/2016   PROT 7.5 06/14/2016   ALBUMIN 3.7 06/14/2016   AST 32 06/14/2016   ALT 33 06/14/2016   ALKPHOS 109 06/14/2016   BILITOT 0.5 06/14/2016  GFRNONAA >60 06/14/2016   GFRAA >60 06/14/2016    Lab Results  Component Value Date   WBC 7.1 09/25/2016   NEUTROABS 4.0 09/25/2016   HGB 13.0 09/25/2016   HCT 38.9 09/25/2016   MCV 75.4 (L) 09/25/2016   PLT 289 09/25/2016   Lab Results  Component Value Date   IRON 63 09/25/2016   TIBC 343 09/25/2016   IRONPCTSAT 18 09/25/2016    Lab Results  Component Value Date   FERRITIN 78 09/25/2016     STUDIES: No results found.  ASSESSMENT: Iron deficiency anemia.  PLAN:    1. Iron deficiency anemia: Patient's hemoglobin and her iron stores are now within normal limits. Previously, B12, folate, and hemoglobin electrophoresis were all within normal limits. No intervention is needed at this time. No further follow-up is necessary. Please refer patient back if there are any questions or concerns.  2. Pregnancy: By report, patient had an uncomplicated delivery several months ago.  Patient expressed understanding and was in agreement with this plan. She also understands that She can call clinic at any time with any questions, concerns, or complaints.   Jeralyn Ruths, MD   09/26/2016 4:54 PM

## 2016-09-25 ENCOUNTER — Inpatient Hospital Stay: Payer: Self-pay

## 2016-09-25 ENCOUNTER — Inpatient Hospital Stay: Payer: Self-pay | Attending: Oncology | Admitting: Oncology

## 2016-09-25 ENCOUNTER — Encounter: Payer: Self-pay | Admitting: Oncology

## 2016-09-25 DIAGNOSIS — O99013 Anemia complicating pregnancy, third trimester: Secondary | ICD-10-CM

## 2016-09-25 DIAGNOSIS — D508 Other iron deficiency anemias: Secondary | ICD-10-CM

## 2016-09-25 DIAGNOSIS — D509 Iron deficiency anemia, unspecified: Secondary | ICD-10-CM

## 2016-09-25 LAB — CBC WITH DIFFERENTIAL/PLATELET
BASOS PCT: 1 %
Basophils Absolute: 0 10*3/uL (ref 0–0.1)
EOS ABS: 0.5 10*3/uL (ref 0–0.7)
Eosinophils Relative: 7 %
HCT: 38.9 % (ref 35.0–47.0)
HEMOGLOBIN: 13 g/dL (ref 12.0–16.0)
Lymphocytes Relative: 33 %
Lymphs Abs: 2.4 10*3/uL (ref 1.0–3.6)
MCH: 25.2 pg — ABNORMAL LOW (ref 26.0–34.0)
MCHC: 33.5 g/dL (ref 32.0–36.0)
MCV: 75.4 fL — ABNORMAL LOW (ref 80.0–100.0)
Monocytes Absolute: 0.3 10*3/uL (ref 0.2–0.9)
Monocytes Relative: 4 %
NEUTROS PCT: 55 %
Neutro Abs: 4 10*3/uL (ref 1.4–6.5)
PLATELETS: 289 10*3/uL (ref 150–440)
RBC: 5.16 MIL/uL (ref 3.80–5.20)
RDW: 14 % (ref 11.5–14.5)
WBC: 7.1 10*3/uL (ref 3.6–11.0)

## 2016-09-25 LAB — IRON AND TIBC
Iron: 63 ug/dL (ref 28–170)
SATURATION RATIOS: 18 % (ref 10.4–31.8)
TIBC: 343 ug/dL (ref 250–450)
UIBC: 280 ug/dL

## 2016-09-25 LAB — FERRITIN: Ferritin: 78 ng/mL (ref 11–307)

## 2016-09-25 NOTE — Progress Notes (Signed)
Pt gets dizzy at times and feels tired. Is taking iron pills bid.  VS checked sitting and standing.

## 2017-02-21 IMAGING — US US OB FOLLOW-UP
1 series · 13 of 28 positions shown · non-contrast
Comparison: 05/04/2016

CLINICAL DATA: Current assigned gestational age of 37 weeks 2 days.
Measuring small for dates.

EXAM:
OBSTETRIC 14+ WK ULTRASOUND FOLLOW-UP

[Series 1: us ob follow-up · 0.26mm/px · 13 of 38 slices shown]
[im 2/38]
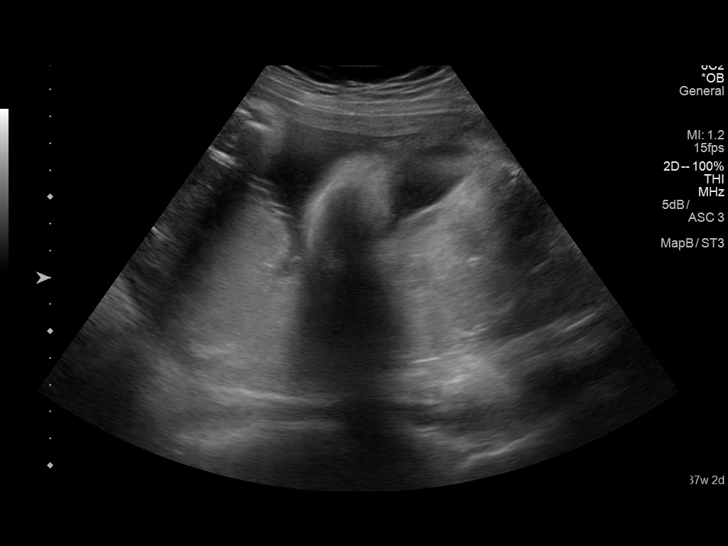
[im 5/38]
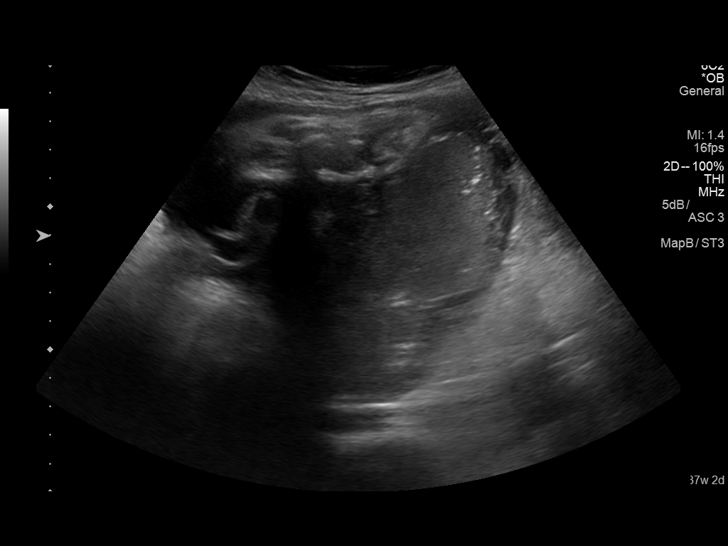
[im 7/38]
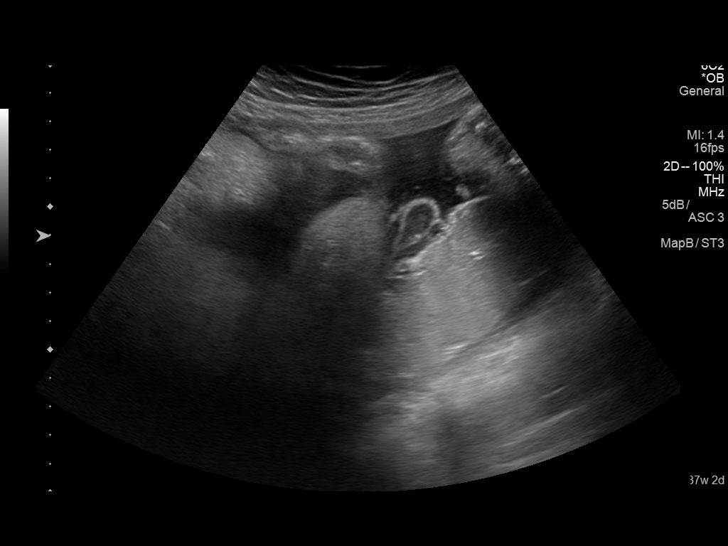
[im 10/38]
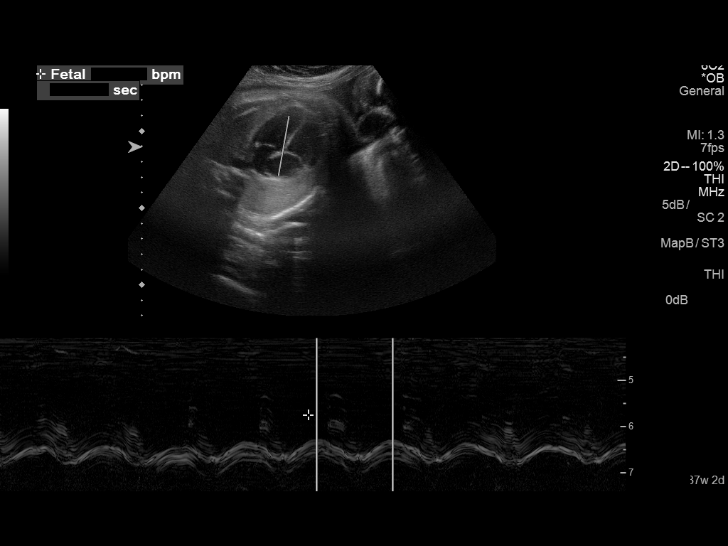
[im 13/38]
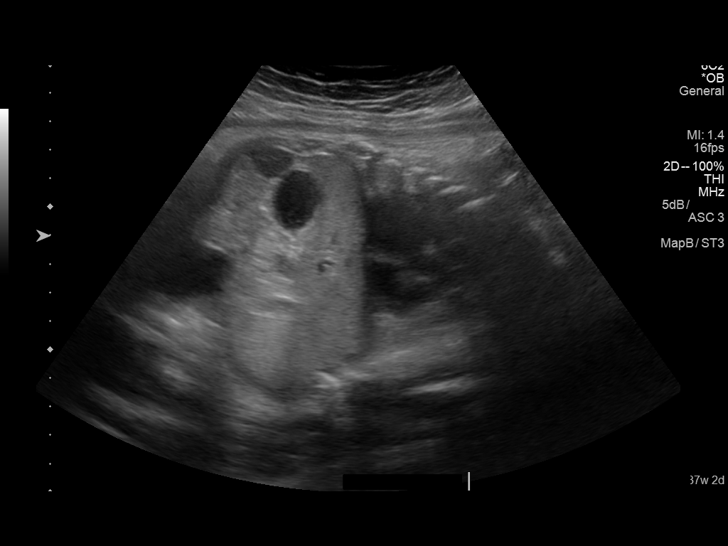
[im 16/38]
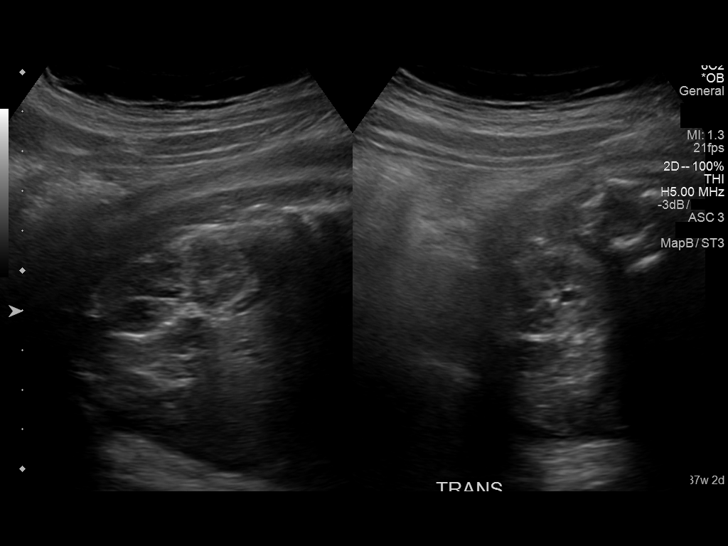
[im 20/38]
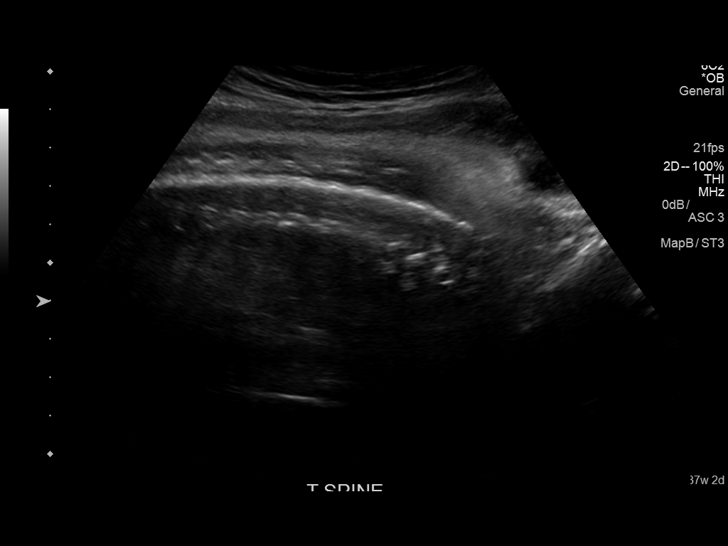
[im 22/38]
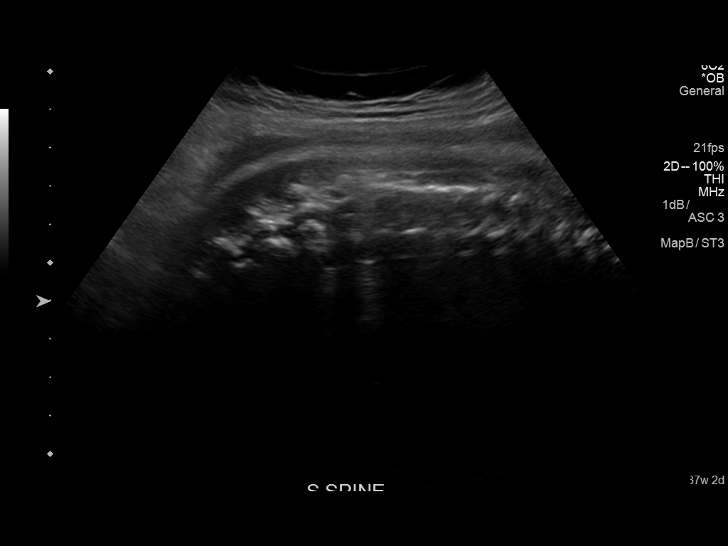
[im 25/38]
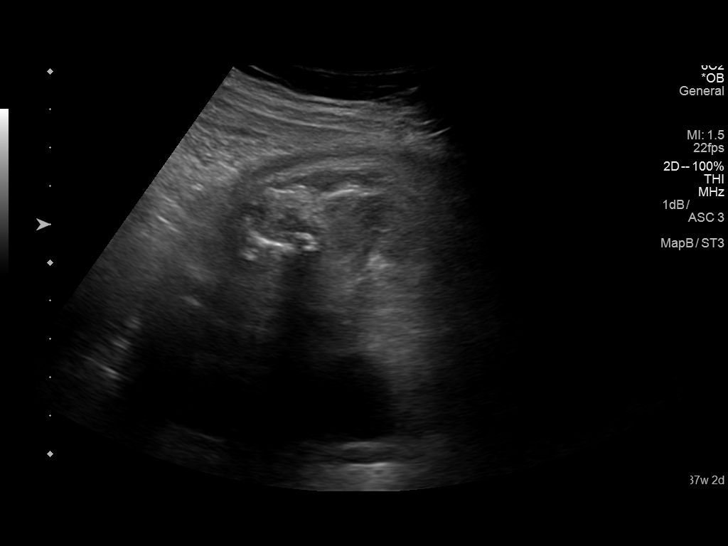
[im 28/38]
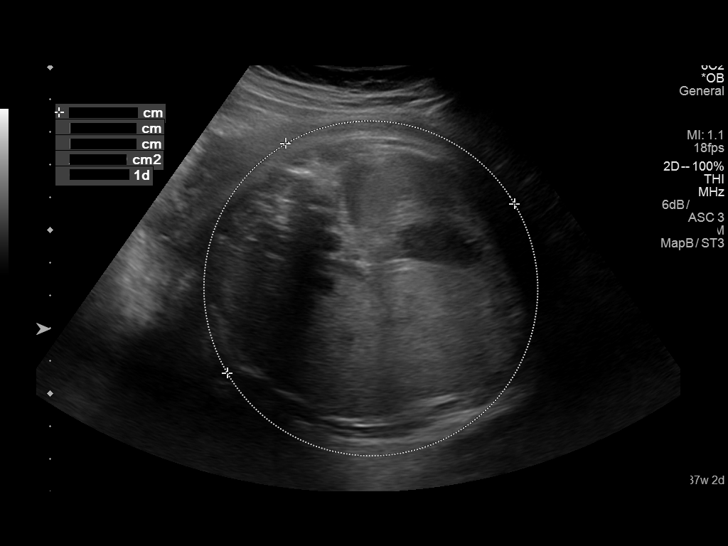
[im 31/38]
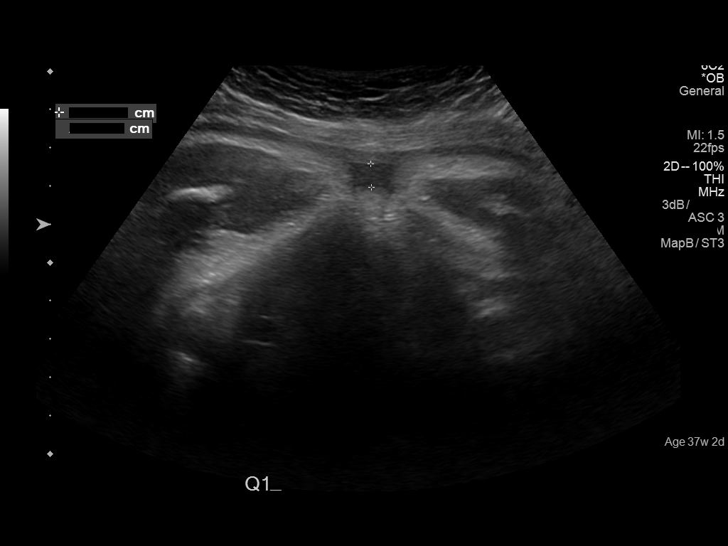
[im 33/38]
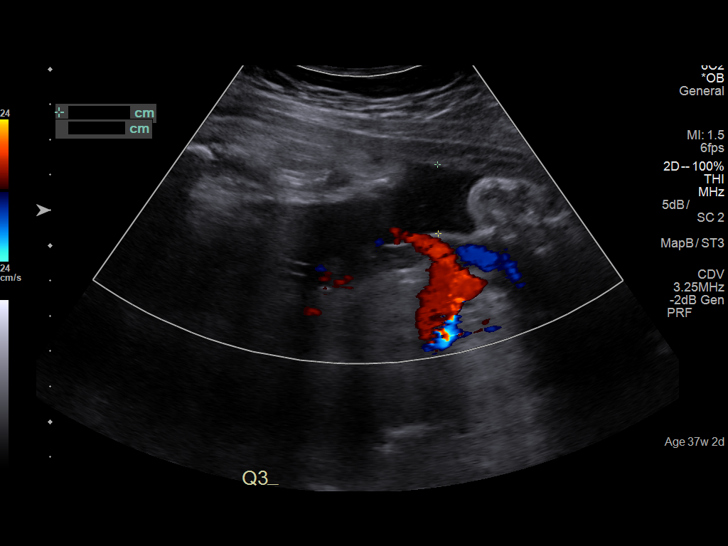
[im 36/38]
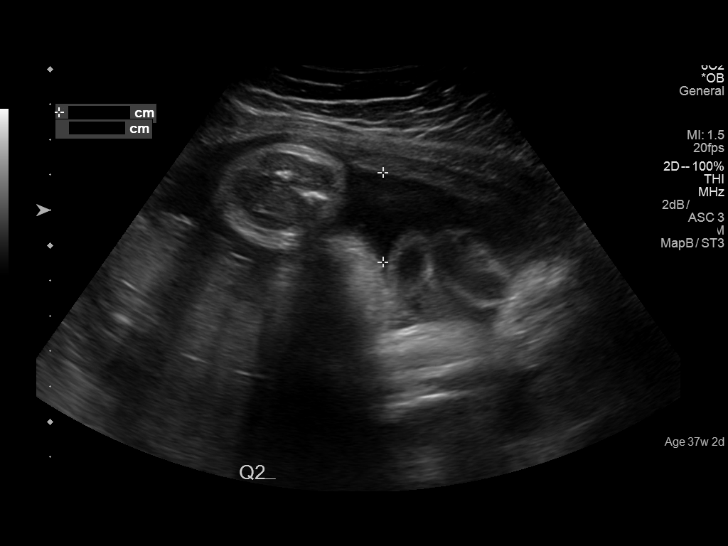

[13 of 28 positions shown; findings below may reference images not displayed]

FINDINGS: Number of Fetuses: 1

Heart Rate:  135 bpm

Movement: Yes

Presentation: Cephalic

Previa: No

Placental Location: Fundal and left lateral

Amniotic Fluid (Subjective): Lower normal range

Amniotic Fluid (Objective):

AFI 8.6 cm (5%ile= 7.5 cm, 95%= 24.4 cm for 37 wks)

FETAL BIOMETRY

BPD:  9.1cm 37w   0d

HC:    32.7cm  37w   1d

AC:   32.2cm  36w   1d

FL:   7.0cm  36w   0d

Current Mean GA: 36w 4d          US EDC: 06/11/2016

Assigned GA:  37w 2d              Assigned EDC:  06/06/2016

Estimated Fetal Weight: 2,898g 31%ile, compared to 42 %ile on
previous exam

FETAL ANATOMY

Lateral Ventricles: Previously seen

Thalami/CSP: Previously seen

Posterior Fossa:  Previously seen

Nuchal region:  Previously seen

Upper Lip: Previously seen

Spine: Visualized

4 Chamber Heart on Left: Appears normal

LVOT: Previously seen

RVOT: Previously seen

Stomach on Left: Appears normal

3 Vessel Cord: Previously seen

Cord Insertion site: Previously seen

Kidneys: Appears normal

Bladder: Appears normal

Extremities: Previously seen

Sex: Male previously seen

Technically difficult due to: Advanced gestational age and fetal
position

MATERNAL FINDINGS:

Cervix: Not visualized >34 weeks
IMPRESSION: Assigned gestational age is currently 37 weeks 2 days. Estimated
fetal weight is currently at 31 percentile, mildly decreased from 42
percentile on previous study.

Low normal amniotic fluid volume, with AFI measuring 8.6 cm.

## 2017-03-31 ENCOUNTER — Encounter: Payer: Self-pay | Admitting: Emergency Medicine

## 2017-03-31 ENCOUNTER — Emergency Department: Payer: Self-pay

## 2017-03-31 ENCOUNTER — Emergency Department
Admission: EM | Admit: 2017-03-31 | Discharge: 2017-03-31 | Disposition: A | Discharge status: Home / Self Care | Payer: Self-pay | Attending: Emergency Medicine | Admitting: Emergency Medicine

## 2017-03-31 DIAGNOSIS — R110 Nausea: Secondary | ICD-10-CM | POA: Insufficient documentation

## 2017-03-31 DIAGNOSIS — Z79899 Other long term (current) drug therapy: Secondary | ICD-10-CM | POA: Insufficient documentation

## 2017-03-31 DIAGNOSIS — R102 Pelvic and perineal pain: Secondary | ICD-10-CM | POA: Insufficient documentation

## 2017-03-31 DIAGNOSIS — R1030 Lower abdominal pain, unspecified: Secondary | ICD-10-CM

## 2017-03-31 LAB — COMPREHENSIVE METABOLIC PANEL
ALBUMIN: 4.3 g/dL (ref 3.5–5.0)
ALT: 30 U/L (ref 14–54)
ANION GAP: 10 (ref 5–15)
AST: 31 U/L (ref 15–41)
Alkaline Phosphatase: 77 U/L (ref 38–126)
BUN: 10 mg/dL (ref 6–20)
CHLORIDE: 107 mmol/L (ref 101–111)
CO2: 20 mmol/L — AB (ref 22–32)
Calcium: 9.1 mg/dL (ref 8.9–10.3)
Creatinine, Ser: 0.61 mg/dL (ref 0.44–1.00)
GFR calc Af Amer: >60 mL/min (ref >60)
GFR calc non Af Amer: >60 mL/min (ref >60)
GLUCOSE: 110 mg/dL — AB (ref 65–99)
POTASSIUM: 2.9 mmol/L — AB (ref 3.5–5.1)
SODIUM: 137 mmol/L (ref 135–145)
Total Bilirubin: 0.6 mg/dL (ref 0.3–1.2)
Total Protein: 7.8 g/dL (ref 6.5–8.1)

## 2017-03-31 LAB — CBC
HCT: 40.1 % (ref 35.0–47.0)
Hemoglobin: 13.1 g/dL (ref 12.0–16.0)
MCH: 24.2 pg — ABNORMAL LOW (ref 26.0–34.0)
MCHC: 32.7 g/dL (ref 32.0–36.0)
MCV: 74 fL — ABNORMAL LOW (ref 80.0–100.0)
Platelets: 248 10*3/uL (ref 150–440)
RBC: 5.41 MIL/uL — ABNORMAL HIGH (ref 3.80–5.20)
RDW: 14.4 % (ref 11.5–14.5)
WBC: 12.3 10*3/uL — AB (ref 3.6–11.0)

## 2017-03-31 LAB — URINALYSIS, COMPLETE (UACMP) WITH MICROSCOPIC
BILIRUBIN URINE: NEGATIVE
Bacteria, UA: NONE SEEN
GLUCOSE, UA: NEGATIVE mg/dL
HGB URINE DIPSTICK: NEGATIVE
Ketones, ur: NEGATIVE mg/dL
Nitrite: NEGATIVE
Protein, ur: NEGATIVE mg/dL
RBC / HPF: NONE SEEN RBC/hpf (ref 0–5)
Specific Gravity, Urine: 1.008 (ref 1.005–1.030)
pH: 7 (ref 5.0–8.0)

## 2017-03-31 LAB — HCG, QUANTITATIVE, PREGNANCY

## 2017-03-31 LAB — WET PREP, GENITAL
CLUE CELLS WET PREP: NONE SEEN
Trich, Wet Prep: NONE SEEN
Yeast Wet Prep HPF POC: NONE SEEN

## 2017-03-31 LAB — LIPASE, BLOOD: Lipase: 29 U/L (ref 11–51)

## 2017-03-31 LAB — CHLAMYDIA/NGC RT PCR (ARMC ONLY)
CHLAMYDIA TR: NOT DETECTED
N GONORRHOEAE: NOT DETECTED

## 2017-03-31 MED ORDER — ONDANSETRON HCL 4 MG/2ML IJ SOLN
4.0000 mg | Freq: Once | INTRAMUSCULAR | Status: AC
  Administered 2017-03-31: 4 mg via INTRAVENOUS
  Filled 2017-03-31: qty 2

## 2017-03-31 MED ORDER — MORPHINE SULFATE (PF) 4 MG/ML IV SOLN
4.0000 mg | Freq: Once | INTRAVENOUS | Status: AC
  Administered 2017-03-31: 4 mg via INTRAVENOUS
  Filled 2017-03-31: qty 1

## 2017-03-31 MED ORDER — SODIUM CHLORIDE 0.9 % IV BOLUS (SEPSIS)
1000.0000 mL | Freq: Once | INTRAVENOUS | Status: AC
  Administered 2017-03-31: 1000 mL via INTRAVENOUS

## 2017-03-31 MED ORDER — ONDANSETRON HCL 4 MG PO TABS: 4.0000 mg | ORAL_TABLET | Freq: Every day | ORAL | 0 refills | Status: DC | PRN

## 2017-03-31 NOTE — ED Notes (Signed)
Pt in NAD and is aware that she will be going for a CT scan. Pt's family was given a warm blanket.

## 2017-03-31 NOTE — ED Notes (Signed)
Received report from Dorinda Hill, Charity fundraiser. Pt is currently in [place].

## 2017-03-31 NOTE — ED Notes (Signed)

## 2017-03-31 NOTE — ED Notes (Signed)
Patient transported to CT 

## 2017-03-31 NOTE — ED Triage Notes (Signed)
States lower abd pain since this am, denies preg however last period last year, has been on depo but not current.

## 2017-03-31 NOTE — ED Notes (Addendum)
Left sided lower abdominal pain with radiation through to the back, 10/10 sharp

## 2017-03-31 NOTE — ED Provider Notes (Signed)
[facility] Emergency Department Provider Note  ____________________________________________   First MD Initiated Contact with Patient 03/31/17 [phone]     (approximate)  I have reviewed the triage vital signs and the nursing notes.   HISTORY  Chief Complaint Abdominal Pain   HPI Pamela Cole is a 21 y.o. female with a history of anemia who is presenting to the emergency department with lower abdominal pain that started at about 8 AM this morning. She says the pain started as a crampy pain but has progressed to a sharp pain that is now radiating to her left lower back. She denies any vaginal bleeding or discharge. Says that she has been nauseous but no vomiting. Says the pain has been intermittent and now is an 8 out of 10 but quickly rises to a 10 out of 10 at times. She denies any burning with urination. Denies any family history of kidney stones. Says that she has off of her Depakote at this time because she is scheduled to get an Implanon.Patient did not express concerns about STDs.   Past Medical History:  Diagnosis Date  . Anemia     Patient Active Problem List   Diagnosis Date Noted  . Iron deficiency anemia 09/24/2016  . Labor and delivery, indication for care 06/07/2016  . Indication for care in labor and delivery, delivered 06/07/2016  . Supervision of high risk pregnancy in third trimester 06/01/2016  . IUGR (intrauterine growth restriction) affecting care of mother, third trimester, other fetus 05/29/2016  . Oligohydramnios antepartum 05/29/2016  . Pregnancy 05/25/2016  . Indication for care in labor or delivery 04/28/2016  . Indication for care in labor and delivery, antepartum 04/17/2016  . Anemia affecting pregnancy in third trimester 04/17/2016  . Abdominal pain affecting pregnancy 04/01/2016    Past Surgical History:  Procedure Laterality Date  . NO PAST SURGERIES      Prior to Admission medications   Medication Sig  Start Date End Date Taking? Authorizing Provider  ferrous sulfate 325 (65 FE) MG tablet Take 325 mg by mouth 2 (two) times daily with a meal.     [provider]  ibuprofen (ADVIL,MOTRIN) 600 MG tablet Take 1 tablet (600 mg total) by mouth every 6 (six) hours. Patient not taking: Reported on 09/25/2016 06/08/16   Schermerhorn, Ihor Austin, MD  medroxyPROGESTERone (DEPO-PROVERA) 150 MG/ML injection Inject 1 mL (150 mg total) into the muscle once. 06/08/16 06/08/16  Schermerhorn, Ihor Austin, MD    Allergies Patient has no known allergies.  No family history on file.  Social History Social History  Substance Use Topics  . Smoking status: Never Smoker  . Smokeless tobacco: Never Used  . Alcohol use No    Review of Systems  Constitutional: No fever/chills Eyes: No visual changes. ENT: No sore throat. Cardiovascular: Denies chest pain. Respiratory: Denies shortness of breath. Gastrointestinal: no vomiting.  No diarrhea.  No constipation. Genitourinary: Negative for dysuria. Musculoskeletal: Negative for back pain. Skin: Negative for rash. Neurological: Negative for headaches, focal weakness or numbness.   ____________________________________________   PHYSICAL EXAM:  VITAL SIGNS: ED Triage Vitals  Enc Vitals Group     BP 03/31/17 0903 116/84     Pulse Rate 03/31/17 0903 80     Resp 03/31/17 0903 (!) 24     Temp 03/31/17 0903 97.7 F (36.5 C)     Temp Source 03/31/17 0903 Oral     SpO2 03/31/17 0903 99 %     Weight  03/31/17 0904 135 lb (61.2 kg)     Height --      Head Circumference --      Peak Flow --      Pain Score 03/31/17 0901 10     Pain Loc --      Pain Edu? --      Excl. in GC? --     Constitutional: Alert and oriented. Appears uncomfortable Eyes: Conjunctivae are normal.  Head: Atraumatic. Nose: No congestion/rhinnorhea. Mouth/Throat: Mucous membranes are moist.  Neck: No stridor.   Cardiovascular: Normal rate, regular rhythm. Grossly normal heart  sounds.   Respiratory: Normal respiratory effort.  No retractions. Lungs CTAB. Gastrointestinal: Soft with suprapubic as well as left lower quadrant tenderness to palpation. Negative Murphy sign. Minimal right lower quadrant tenderness palpation. No distention. Left-sided CVA tenderness to palpation. Genitourinary:  Normal external exam without any lesions. Speculum exam with a minimal clear to white discharge. Bimanual exam with a positive CMT. Uterine and left adnexal tenderness to palpation. No masses palpated. No right adnexal tenderness palpated. Musculoskeletal: No lower extremity tenderness nor edema.  No joint effusions. Neurologic:  Normal speech and language. No gross focal neurologic deficits are appreciated. Skin:  Skin is warm, dry and intact. No rash noted. Psychiatric: Mood and affect are normal. Speech and behavior are normal.  ____________________________________________   LABS (all labs ordered are listed, but only abnormal results are displayed)  Labs Reviewed  WET PREP, GENITAL - Abnormal; Notable for the following:       Result Value   WBC, Wet Prep HPF POC RARE (*)    All other components within normal limits  COMPREHENSIVE METABOLIC PANEL - Abnormal; Notable for the following:    Potassium 2.9 (*)    CO2 20 (*)    Glucose, Bld 110 (*)    All other components within normal limits  CBC - Abnormal; Notable for the following:    WBC 12.3 (*)    RBC 5.41 (*)    MCV 74.0 (*)    MCH 24.2 (*)    All other components within normal limits  URINALYSIS, COMPLETE (UACMP) WITH MICROSCOPIC - Abnormal; Notable for the following:    Color, Urine STRAW (*)    APPearance CLEAR (*)    Leukocytes, UA SMALL (*)    Squamous Epithelial / LPF 0-5 (*)    All other components within normal limits  CHLAMYDIA/NGC RT PCR ([facility] ONLY)  LIPASE, BLOOD  HCG, QUANTITATIVE, PREGNANCY    ____________________________________________  EKG   ____________________________________________  RADIOLOGY  Negative pelvic ultrasound without any evidence of torsion.  CT of the abdomen without any acute process. ____________________________________________   PROCEDURES  Procedure(s) performed:   Procedures  Critical Care performed:   ____________________________________________   INITIAL IMPRESSION / ASSESSMENT AND PLAN / ED COURSE  Pertinent labs & imaging results that were available during my care of the patient were reviewed by me and considered in my medical decision making (see chart for details).  ----------------------------------------- 1:15 PM on 03/31/2017 -----------------------------------------  Patient with persistent for to 5 out of 10 pain. Very reassuring ultrasound of the pelvis. However, because of the persistent pain we will proceed with CAT scan at this time. Reexamined the patient's abdomen she is soft and minimally tender to the suprapubic as well as left lower quadrant.    ----------------------------------------- 2:58 PM on 03/31/2017 -----------------------------------------  Patient with minimal pain at this time subjectively. Persistently mild to moderately tender to the suprapubic as well as left lower  quadrant. I discussed the case with Dr.Staebler of OB/GYN recommends follow-up in office. He has a very low suspicion for ovarian torsion because of the patient's reassuring ultrasound without a mass or evidence of torsion. I explained follow-up to the patient and she is understanding and willingness to comply. Will be discharged at this time.  ____________________________________________   FINAL CLINICAL IMPRESSION(S) / ED DIAGNOSES  Final diagnoses:  Pelvic pain  Pelvic pain   Abdominal pain.   NEW MEDICATIONS STARTED DURING THIS VISIT:  New Prescriptions   No medications on file     Note:  This document was prepared  using Dragon voice recognition software and may include unintentional dictation errors.     Myrna Blazer, MD 03/31/17 [phone]

## 2017-03-31 NOTE — ED Notes (Addendum)
Pt returned from CT. Pt laying on stretcher in NAD.

## 2017-11-12 ENCOUNTER — Encounter: Payer: Self-pay | Admitting: Emergency Medicine

## 2017-11-12 ENCOUNTER — Emergency Department
Admission: EM | Admit: 2017-11-12 | Discharge: 2017-11-12 | Disposition: A | Discharge status: Home / Self Care | Payer: Self-pay | Attending: Emergency Medicine | Admitting: Emergency Medicine

## 2017-11-12 ENCOUNTER — Other Ambulatory Visit: Payer: Self-pay

## 2017-11-12 DIAGNOSIS — Z79899 Other long term (current) drug therapy: Secondary | ICD-10-CM | POA: Insufficient documentation

## 2017-11-12 DIAGNOSIS — N3090 Cystitis, unspecified without hematuria: Secondary | ICD-10-CM | POA: Insufficient documentation

## 2017-11-12 LAB — URINALYSIS, COMPLETE (UACMP) WITH MICROSCOPIC
BACTERIA UA: NONE SEEN
BILIRUBIN URINE: NEGATIVE
Glucose, UA: NEGATIVE mg/dL
Ketones, ur: NEGATIVE mg/dL
Nitrite: NEGATIVE
PH: 7 (ref 5.0–8.0)
Protein, ur: NEGATIVE mg/dL
Specific Gravity, Urine: 1.017 (ref 1.005–1.030)

## 2017-11-12 LAB — COMPREHENSIVE METABOLIC PANEL
ALT: 39 U/L (ref 14–54)
AST: 33 U/L (ref 15–41)
Albumin: 4.2 g/dL (ref 3.5–5.0)
Alkaline Phosphatase: 107 U/L (ref 38–126)
Anion gap: 8 (ref 5–15)
BUN: 6 mg/dL (ref 6–20)
CHLORIDE: 105 mmol/L (ref 101–111)
CO2: 25 mmol/L (ref 22–32)
Calcium: 8.4 mg/dL — ABNORMAL LOW (ref 8.9–10.3)
Creatinine, Ser: 0.6 mg/dL (ref 0.44–1.00)
Glucose, Bld: 123 mg/dL — ABNORMAL HIGH (ref 65–99)
POTASSIUM: 3.1 mmol/L — AB (ref 3.5–5.1)
SODIUM: 138 mmol/L (ref 135–145)
Total Bilirubin: 0.4 mg/dL (ref 0.3–1.2)
Total Protein: 7.5 g/dL (ref 6.5–8.1)

## 2017-11-12 LAB — CBC
HEMATOCRIT: 39.9 % (ref 35.0–47.0)
HEMOGLOBIN: 12.7 g/dL (ref 12.0–16.0)
MCH: 23.9 pg — ABNORMAL LOW (ref 26.0–34.0)
MCHC: 31.8 g/dL — ABNORMAL LOW (ref 32.0–36.0)
MCV: 75.1 fL — AB (ref 80.0–100.0)
Platelets: 261 10*3/uL (ref 150–440)
RBC: 5.31 MIL/uL — AB (ref 3.80–5.20)
RDW: 14.5 % (ref 11.5–14.5)
WBC: 8.2 10*3/uL (ref 3.6–11.0)

## 2017-11-12 LAB — POCT PREGNANCY, URINE: PREG TEST UR: NEGATIVE

## 2017-11-12 LAB — LIPASE, BLOOD: LIPASE: 33 U/L (ref 11–51)

## 2017-11-12 MED ORDER — CEPHALEXIN 500 MG PO CAPS
500.0000 mg | ORAL_CAPSULE | Freq: Once | ORAL | Status: AC
  Administered 2017-11-12: 500 mg via ORAL
  Filled 2017-11-12: qty 1

## 2017-11-12 MED ORDER — CEPHALEXIN 500 MG PO CAPS: 500.0000 mg | ORAL_CAPSULE | Freq: Two times a day (BID) | ORAL | 0 refills | Status: AC

## 2017-11-12 NOTE — ED Provider Notes (Signed)
[facility] Emergency Department Provider Note ____________________________________________   First MD Initiated Contact with Patient 11/12/17 2146     (approximate)  I have reviewed the triage vital signs and the nursing notes.   HISTORY  Chief Complaint Abdominal Pain and Breast Pain    HPI Pamela Cole is a 22 y.o. female who presents with suprapubic abdominal pain for the last 3 days, intermittent, occurring in the midline, and sometimes radiating to her lower back.  She reports associated urinary frequency, and some breast tenderness.  She denies vomiting, diarrhea, vaginal bleeding or discharge, or fever.  No flank pain.  Past Medical History:  Diagnosis Date  . Anemia     Patient Active Problem List   Diagnosis Date Noted  . Iron deficiency anemia 09/24/2016  . Labor and delivery, indication for care 06/07/2016  . Indication for care in labor and delivery, delivered 06/07/2016  . Supervision of high risk pregnancy in third trimester 06/01/2016  . IUGR (intrauterine growth restriction) affecting care of mother, third trimester, other fetus 05/29/2016  . Oligohydramnios antepartum 05/29/2016  . Pregnancy 05/25/2016  . Indication for care in labor or delivery 04/28/2016  . Indication for care in labor and delivery, antepartum 04/17/2016  . Anemia affecting pregnancy in third trimester 04/17/2016  . Abdominal pain affecting pregnancy 04/01/2016    Past Surgical History:  Procedure Laterality Date  . NO PAST SURGERIES      Prior to Admission medications   Medication Sig Start Date End Date Taking? Authorizing Provider  cephALEXin (KEFLEX) 500 MG capsule Take 1 capsule (500 mg total) by mouth 2 (two) times daily for 7 days. 11/13/17 11/20/17  Dionne Bucy, MD  ferrous sulfate 325 (65 FE) MG tablet Take 325 mg by mouth 2 (two) times daily with a meal.     [provider]  ibuprofen (ADVIL,MOTRIN) 600 MG tablet Take 1  tablet (600 mg total) by mouth every 6 (six) hours. Patient not taking: Reported on 09/25/2016 06/08/16   Schermerhorn, Ihor Austin, MD  medroxyPROGESTERone (DEPO-PROVERA) 150 MG/ML injection Inject 1 mL (150 mg total) into the muscle once. 06/08/16 06/08/16  Schermerhorn, Ihor Austin, MD  ondansetron (ZOFRAN) 4 MG tablet Take 1 tablet (4 mg total) by mouth daily as needed. 03/31/17   Schaevitz, Myra Rude, MD    Allergies Patient has no known allergies.  History reviewed. No pertinent family history.  Social History Social History   Tobacco Use  . Smoking status: Never Smoker  . Smokeless tobacco: Never Used  Substance Use Topics  . Alcohol use: No  . Drug use: No    Review of Systems  Constitutional: No fever. Cardiovascular: Denies chest pain. Respiratory: Denies shortness of breath. Gastrointestinal: No vomiting.  Genitourinary: Positive for urinary frequency.  Musculoskeletal: Positive for back pain. Skin: Negative for rash. Neurological: Negative for headache.   ____________________________________________   PHYSICAL EXAM:  VITAL SIGNS: ED Triage Vitals  Enc Vitals Group     BP 11/12/17 2020 124/81     Pulse Rate 11/12/17 2020 (!) 103     Resp 11/12/17 2020 18     Temp 11/12/17 2020 99 F (37.2 C)     Temp Source 11/12/17 2020 Oral     SpO2 11/12/17 2020 100 %     Weight 11/12/17 2021 131 lb (59.4 kg)     Height 11/12/17 2021 4\' 11"  (1.499 m)     Head Circumference --      Peak Flow --  Pain Score 11/12/17 2020 3     Pain Loc --      Pain Edu? --      Excl. in GC? --     Constitutional: Alert and oriented. Well appearing and in no acute distress. Eyes: Conjunctivae are normal.  Head: Atraumatic. Nose: No congestion/rhinnorhea. Mouth/Throat: Mucous membranes are moist.   Neck: Normal range of motion.  Cardiovascular:  Good peripheral circulation. Respiratory: Normal respiratory effort.  No retractions.  Gastrointestinal: Soft with mild midline  suprapubic discomfort but no focal tenderness. No distention.  Genitourinary: No CVA or flank tenderness. Musculoskeletal: Extremities warm and well perfused.  Neurologic:  Normal speech and language. No gross focal neurologic deficits are appreciated.  Skin:  Skin is warm and dry. No rash noted. Psychiatric: Mood and affect are normal. Speech and behavior are normal.  ____________________________________________   LABS (all labs ordered are listed, but only abnormal results are displayed)  Labs Reviewed  COMPREHENSIVE METABOLIC PANEL - Abnormal; Notable for the following components:      Result Value   Potassium 3.1 (*)    Glucose, Bld 123 (*)    Calcium 8.4 (*)    All other components within normal limits  CBC - Abnormal; Notable for the following components:   RBC 5.31 (*)    MCV 75.1 (*)    MCH 23.9 (*)    MCHC 31.8 (*)    All other components within normal limits  URINALYSIS, COMPLETE (UACMP) WITH MICROSCOPIC - Abnormal; Notable for the following components:   Color, Urine YELLOW (*)    APPearance HAZY (*)    Hgb urine dipstick MODERATE (*)    Leukocytes, UA MODERATE (*)    Squamous Epithelial / LPF 6-30 (*)    All other components within normal limits  LIPASE, BLOOD  POC URINE PREG, ED  POCT PREGNANCY, URINE   ____________________________________________  EKG   ____________________________________________  RADIOLOGY    ____________________________________________   PROCEDURES  Procedure(s) performed: No  Procedures  Critical Care performed: No ____________________________________________   INITIAL IMPRESSION / ASSESSMENT AND PLAN / ED COURSE  Pertinent labs & imaging results that were available during my care of the patient were reviewed by me and considered in my medical decision making (see chart for details).  22 year old female presents with suprapubic abdominal pain for the last several days, associated with urinary frequency but not with  any other significant symptoms.  On exam, the patient has a slightly elevated temperature and borderline tachycardia but other vital signs are normal.  She is well-appearing.  Abdomen is soft with no focal tenderness, but some mild suprapubic discomfort.  No vaginal bleeding or discharge report of her patient, so pelvic exam was not indicated.  Patient's lab workup is unremarkable, however her UA showed significant WBCs and leukocyte esterase consistent with acute UTI.  Patient's presentation overall is consistent with cystitis.  Given the UA findings corresponding to patient's symptoms, as well as the intermittent nature of the pain in the midline location, I have very low suspicion for other pelvic etiology such as fibroid, cyst, or endometriosis.  No clinical evidence of ovarian torsion.  The patient feels well to go home.  We will treat with antibiotics for UTI.  Return precautions given, and the patient expresses understanding.  She agrees to follow-up with her PMD.      ____________________________________________   FINAL CLINICAL IMPRESSION(S) / ED DIAGNOSES  Final diagnoses:  Cystitis      NEW MEDICATIONS STARTED DURING THIS VISIT:  New Prescriptions   CEPHALEXIN (KEFLEX) 500 MG CAPSULE    Take 1 capsule (500 mg total) by mouth 2 (two) times daily for 7 days.     Note:  This document was prepared using Dragon voice recognition software and may include unintentional dictation errors.    Dionne Bucy, MD 11/12/17 2217

## 2017-11-12 NOTE — ED Triage Notes (Signed)
Pt arrived to the ED for complaints of abdominal pain and breast tenderness. Pt states that 3 day ago she began to experience that symptoms intermittently without relieve. Pt states that she has and implant for contraception and that she does not believes that she might be pregnant.

## 2017-11-12 NOTE — Discharge Instructions (Addendum)
Take the antibiotic as prescribed and finish the full course.  You may take over-the-counter Tylenol or ibuprofen for pain.  Return to the ER for new, worsening, or persistent severe pain, fevers, weakness or lightheadedness, vomiting, or any other new or worsening symptoms that concern you.

## 2018-08-07 NOTE — L&D Delivery Note (Addendum)
Delivery Note At 5:29 AM a viable female was delivered via Vaginal, Spontaneous (Presentation:vtx ROA ;  ).  APGAR: 8, 9; weight  .   Placenta status: intact, .  Cord:3v  with the following complications:none .  Uncomplicated SVD  Mother pushes twice and delivered head .tight  nuchal chord was not reduced and was delivered with delivery of the shoulders . Vigorous female placed on abdomen and delayed cord clamping . Placent delivered shortly after . Some mild uterine atony that required uterine massage and IM Methergine x 1. Small 2nd degree laceration repaired without difficulty . Pt tolerated well .   Anesthesia:  CLE Episiotomy:  None  Lacerations: second degree laceration   Suture Repair: 2.0 3.0 vicryl Est. Blood Loss (mL): QBL : 575 cc Mom to postpartum.  Baby to Couplet care / Skin to Skin.  Gwen Her Danijela Vessey 07/10/2019, 6:03 AM

## 2019-01-05 ENCOUNTER — Emergency Department
Admission: EM | Admit: 2019-01-05 | Discharge: 2019-01-05 | Disposition: A | Discharge status: Home / Self Care | Payer: Self-pay | Attending: Emergency Medicine | Admitting: Emergency Medicine

## 2019-01-05 ENCOUNTER — Other Ambulatory Visit: Payer: Self-pay

## 2019-01-05 ENCOUNTER — Encounter: Payer: Self-pay | Admitting: Emergency Medicine

## 2019-01-05 DIAGNOSIS — Z331 Pregnant state, incidental: Secondary | ICD-10-CM | POA: Insufficient documentation

## 2019-01-05 DIAGNOSIS — K0889 Other specified disorders of teeth and supporting structures: Secondary | ICD-10-CM | POA: Insufficient documentation

## 2019-01-05 MED ORDER — AMOXICILLIN 500 MG PO CAPS
500.0000 mg | ORAL_CAPSULE | Freq: Once | ORAL | Status: DC
  Filled 2019-01-05: qty 1

## 2019-01-05 MED ORDER — AMOXICILLIN 875 MG PO TABS: 875.0000 mg | ORAL_TABLET | Freq: Two times a day (BID) | ORAL | 0 refills | Status: DC

## 2019-01-05 MED ORDER — AMOXICILLIN 400 MG/5ML PO SUSR: 875.0000 mg | Freq: Two times a day (BID) | ORAL | 0 refills | Status: AC

## 2019-01-05 MED ORDER — HYDROCODONE-ACETAMINOPHEN 5-325 MG PO TABS
1.0000 | ORAL_TABLET | Freq: Once | ORAL | Status: AC
  Administered 2019-01-05: 1 via ORAL
  Filled 2019-01-05: qty 1

## 2019-01-05 NOTE — Discharge Instructions (Addendum)
OPTIONS FOR DENTAL FOLLOW UP CARE ° °[place] Department of Health and Human Services - Local Safety Net Dental Clinics °http://www.ncdhhs.gov/dph/oralhealth/services/safetynetclinics.htm °  °[facility] (336-562-3123) ° °[facility] [place] ([phone]) ° °[facility] ([phone] ext [phone]) ° °[facility] ([phone]) ° °[facility] ([phone]) °This clinic caters to the indigent population and is on a lottery system. °Location: °[facility], [address], [place] °Clinic Hours: °Wednesdays from 6pm - 9pm, patients seen by a lottery system. °For dates, call or go to www.med.[facility].edu/[facility]/patients/Dental-[facility] °Services: °Cleanings, fillings and simple extractions. °Payment Options: °DENTAL WORK IS FREE OF CHARGE. Bring proof of income or support. °Best way to get seen: °Arrive at 5:15 pm - this is a lottery, NOT first come/first serve, so arriving earlier will not increase your chances of being seen. °  °  °[facility] °[phone] °Select option 1 for emergencies °  °Location: °[facility], [address], [place] °Clinic Hours: °No walk-ins accepted - call the day before to schedule an appointment. °Check in times are 9:30 am and 1:30 pm. °Services: °Simple extractions, temporary fillings, pulpectomy/pulp debridement, uncomplicated abscess drainage. °Payment Options: °PAYMENT IS DUE AT THE TIME OF SERVICE.  Fee is usually $100-200, additional surgical procedures (e.g. abscess drainage) may be extra. °Cash, checks, Visa/MasterCard accepted.  Can file Medicaid if patient is covered for dental - patient should call case worker to check. °No discount for [facility] patients. °Best way to get seen: °MUST call the day before and get onto the schedule. Can usually be seen the next 1-2 days. No walk-ins accepted. °  °  °[facility] °[phone] °   °Location: °[facility], [address], [place] °Clinic Hours: °M, W, Th, F 8am or 1:30pm, Tues 9a or 1:30 - first come/first served. °Services: °Simple extractions, temporary fillings, uncomplicated abscess drainage.  You do not need to be an [place] resident. °Payment Options: °PAYMENT IS DUE AT THE TIME OF SERVICE. °Dental insurance, otherwise sliding scale - bring proof of income or support. °Depending on income and treatment needed, cost is usually $50-200. °Best way to get seen: °Arrive early as it is first come/first served. °  °  °[facility] °[phone] °  °Location: °[address] °Clinic Hours: °Mon-Thu 8a-5p °Services: °Most basic dental services including extractions and fillings. °Payment Options: °PAYMENT IS DUE AT THE TIME OF SERVICE. °Sliding scale, up to 50% off - bring proof if income or support. °Medicaid with dental option accepted. °Best way to get seen: °Call to schedule an appointment, can usually be seen within 2 weeks OR they will try to see walk-ins - show up at 8a or 2p (you may have to wait). °  °  °[facility] °919-245-2435 °[place] RESIDENTS ONLY °  °Location: °[facility], [address], [place],  [address] °Clinic Hours: By appointment only. °Monday - Thursday 8am-5pm, Friday 8am-12pm °Services: Cleanings, fillings, extractions. °Payment Options: °PAYMENT IS DUE AT THE TIME OF SERVICE. °Cash, Visa or MasterCard. Sliding scale - $30 minimum per service. °Best way to get seen: °Come in to office, complete packet and make an appointment - need proof of income °or support monies for each household member and proof of [place] residence. °Usually takes about a month to get in. °  °  °[facility] °[phone] °  °Location: °[address]   [place] °Clinic Hours: Walk-in Urgent Care Dental Services are offered Monday-Friday  mornings only. °The numbers of emergencies accepted daily is limited to the number of °providers available. °Maximum 15 - Mondays, Wednesdays & Thursdays °Maximum 10 - Tuesdays & Fridays °Services: °You do not need to be a [place] resident to be seen for a dental emergency. °Emergencies are defined as pain, swelling, abnormal bleeding, or dental trauma. Walkins will receive x-rays if needed. °NOTE: Dental cleaning is not an emergency. °Payment Options: °PAYMENT IS DUE AT THE TIME OF SERVICE. °Minimum co-pay is $40.00 for uninsured patients. °Minimum co-pay is $3.00 for Medicaid with dental coverage. °Dental Insurance is accepted and must be presented at time of visit. °Medicare does not cover dental. °Forms of payment: Cash, credit card, checks. °Best way to get seen: °If not previously registered with the clinic, walk-in dental registration begins at 7:15 am and is on a first come/first serve basis. °If previously registered with the clinic, call to make an appointment. °  °  °[facility] °[phone] °[place] RESIDENTS ONLY °  °Location: °[address], [place], [place] °Clinic Hours: °Mon-Thu 10a-2p °Services: Extractions only! °Payment Options: °FREE (donations accepted) - bring proof of income or support °Best way to get seen: °Call and schedule an appointment OR come at 8am on the 1st Monday of every month (except for holidays) when it is first come/first served. °  °  °[facility] °[phone] °  °Location: °[address], [place] °Clinic Hours: °Friday mornings °Services, Payment Options, Best way to get seen: °Call for info °

## 2019-01-05 NOTE — ED Notes (Signed)
Pt verbalized understanding of discharge instructions. NAD at this time. 

## 2019-01-05 NOTE — ED Notes (Signed)
Pt has no facial swelling noted. Pt denies fever, headache and vision changes.

## 2019-01-05 NOTE — ED Triage Notes (Signed)
Pt ambulatory to triage with no difficulty. Pt reports pain to her left upper back tooth for about 2 days. Worse this morning. Pt is [redacted] weeks pregnant. Took tylenol around 1 am with no relief.

## 2019-01-05 NOTE — ED Provider Notes (Signed)
[facility] Emergency Department Provider Note  ____________________________________________   First MD Initiated Contact with Patient 01/05/19 0719     (approximate)  I have reviewed the triage vital signs and the nursing notes.   HISTORY  Chief Complaint Dental Pain    HPI Pamela Cole is a 23 y.o. female presents emergency department complaining of tooth pain.  Symptoms for 2 days.  Patient is [redacted] weeks pregnant.    Past Medical History:  Diagnosis Date  . Anemia     Patient Active Problem List   Diagnosis Date Noted  . Iron deficiency anemia 09/24/2016  . Labor and delivery, indication for care 06/07/2016  . Indication for care in labor and delivery, delivered 06/07/2016  . Supervision of high risk pregnancy in third trimester 06/01/2016  . IUGR (intrauterine growth restriction) affecting care of mother, third trimester, other fetus 05/29/2016  . Oligohydramnios antepartum 05/29/2016  . Pregnancy 05/25/2016  . Indication for care in labor or delivery 04/28/2016  . Indication for care in labor and delivery, antepartum 04/17/2016  . Anemia affecting pregnancy in third trimester 04/17/2016  . Abdominal pain affecting pregnancy 04/01/2016    Past Surgical History:  Procedure Laterality Date  . NO PAST SURGERIES      Prior to Admission medications   Medication Sig Start Date End Date Taking? Authorizing Provider  amoxicillin (AMOXIL) 875 MG tablet Take 1 tablet (875 mg total) by mouth 2 (two) times daily. 01/05/19   Fisher, Roselyn Bering, PA-C  ferrous sulfate 325 (65 FE) MG tablet Take 325 mg by mouth 2 (two) times daily with a meal.     [provider]  ibuprofen (ADVIL,MOTRIN) 600 MG tablet Take 1 tablet (600 mg total) by mouth every 6 (six) hours. Patient not taking: Reported on 09/25/2016 06/08/16   Schermerhorn, Ihor Austin, MD  medroxyPROGESTERone (DEPO-PROVERA) 150 MG/ML injection Inject 1 mL (150 mg total) into the muscle  once. 06/08/16 06/08/16  Schermerhorn, Ihor Austin, MD  ondansetron (ZOFRAN) 4 MG tablet Take 1 tablet (4 mg total) by mouth daily as needed. 03/31/17   Schaevitz, Myra Rude, MD    Allergies Patient has no known allergies.  History reviewed. No pertinent family history.  Social History Social History   Tobacco Use  . Smoking status: Never Smoker  . Smokeless tobacco: Never Used  Substance Use Topics  . Alcohol use: No  . Drug use: No    Review of Systems  Constitutional: No fever/chills Eyes: No visual changes. ENT: No sore throat.  Positive for tooth pain Respiratory: Denies cough Genitourinary: Negative for dysuria. Musculoskeletal: Negative for back pain. Skin: Negative for rash.    ____________________________________________   PHYSICAL EXAM:  VITAL SIGNS: ED Triage Vitals  Enc Vitals Group     BP 01/05/19 0551 (!) 165/99     Pulse Rate 01/05/19 0551 (!) 112     Resp 01/05/19 0551 18     Temp 01/05/19 0551 98.8 F (37.1 C)     Temp Source 01/05/19 0551 Oral     SpO2 01/05/19 0551 100 %     Weight 01/05/19 0553 140 lb (63.5 kg)     Height 01/05/19 0553 4\' 11"  (1.499 m)     Head Circumference --      Peak Flow --      Pain Score 01/05/19 0553 10     Pain Loc --      Pain Edu? --      Excl. in GC? --  Constitutional: Alert and oriented. Well appearing and in no acute distress. Eyes: Conjunctivae are normal.  Head: Atraumatic. Nose: No congestion/rhinnorhea. Mouth/Throat: Mucous membranes are moist.  Left upper tooth has a decayed area along with redness and swelling of the gums.  Some bleeding is noted. Neck:  supple no lymphadenopathy noted Cardiovascular: Normal rate, regular rhythm. Heart sounds are normal Respiratory: Normal respiratory effort.  No retractions, lungs c t a  GU: deferred Musculoskeletal: FROM all extremities, warm and well perfused Neurologic:  Normal speech and language.  Skin:  Skin is warm, dry and intact. No rash noted.  Psychiatric: Mood and affect are normal. Speech and behavior are normal.  ____________________________________________   LABS (all labs ordered are listed, but only abnormal results are displayed)  Labs Reviewed - No data to display ____________________________________________   ____________________________________________  RADIOLOGY    ____________________________________________   PROCEDURES  Procedure(s) performed: Vicodin, amoxicillin p.o.   Procedures    ____________________________________________   INITIAL IMPRESSION / ASSESSMENT AND PLAN / ED COURSE  Pertinent labs & imaging results that were available during my care of the patient were reviewed by me and considered in my medical decision making (see chart for details).   Patient is 23 year old 11-week pregnant female complaining of tooth pain.  Physical exam shows the left upper gum to be red and swollen with some bleeding noted at the tooth with dental caries.  Explained findings to the patient.  since she is pregnant and I have concerns of giving her pain medication.  She was given 1 p.o. here in the ED.  She was given a prescription for amoxicillin.  Patient cannot swallow pills so she was given liquid medication.  She is to take Tylenol for pain.  Follow-up with a dental clinic.  Multiple dental clinics were given to her on her discharge papers.  She was discharged in stable condition.     As part of my medical decision making, I reviewed the following data within the electronic MEDICAL RECORD NUMBER Nursing notes reviewed and incorporated, Old chart reviewed, Notes from prior ED visits and [place] Controlled Substance Database  ____________________________________________   FINAL CLINICAL IMPRESSION(S) / ED DIAGNOSES  Final diagnoses:  Pain, dental      NEW MEDICATIONS STARTED DURING THIS VISIT:  New Prescriptions   AMOXICILLIN (AMOXIL) 875 MG TABLET    Take 1 tablet (875 mg total) by mouth 2 (two)  times daily.     Note:  This document was prepared using Dragon voice recognition software and may include unintentional dictation errors.    Faythe GheeFisher, Susan W, PA-C 01/05/19 0912    Arnaldo NatalMalinda, Paul F, MD 01/05/19 [phone]

## 2019-01-07 ENCOUNTER — Other Ambulatory Visit: Payer: Self-pay

## 2019-01-07 ENCOUNTER — Emergency Department
Admission: EM | Admit: 2019-01-07 | Discharge: 2019-01-07 | Disposition: A | Discharge status: Home / Self Care | Payer: Self-pay | Attending: Emergency Medicine | Admitting: Emergency Medicine

## 2019-01-07 ENCOUNTER — Encounter: Payer: Self-pay | Admitting: Emergency Medicine

## 2019-01-07 DIAGNOSIS — T50905A Adverse effect of unspecified drugs, medicaments and biological substances, initial encounter: Secondary | ICD-10-CM

## 2019-01-07 DIAGNOSIS — Z79899 Other long term (current) drug therapy: Secondary | ICD-10-CM | POA: Insufficient documentation

## 2019-01-07 DIAGNOSIS — O9A219 Injury, poisoning and certain other consequences of external causes complicating pregnancy, unspecified trimester: Secondary | ICD-10-CM | POA: Insufficient documentation

## 2019-01-07 DIAGNOSIS — O2340 Unspecified infection of urinary tract in pregnancy, unspecified trimester: Secondary | ICD-10-CM | POA: Insufficient documentation

## 2019-01-07 DIAGNOSIS — N390 Urinary tract infection, site not specified: Secondary | ICD-10-CM

## 2019-01-07 DIAGNOSIS — R400 Somnolence: Secondary | ICD-10-CM | POA: Insufficient documentation

## 2019-01-07 HISTORY — PX: TOOTH EXTRACTION: SUR596

## 2019-01-07 LAB — URINALYSIS, COMPLETE (UACMP) WITH MICROSCOPIC
Bilirubin Urine: NEGATIVE
Glucose, UA: NEGATIVE mg/dL
Hgb urine dipstick: NEGATIVE
Ketones, ur: 20 mg/dL — AB
Nitrite: NEGATIVE
Protein, ur: NEGATIVE mg/dL
Specific Gravity, Urine: 1.014 (ref 1.005–1.030)
pH: 7 (ref 5.0–8.0)

## 2019-01-07 LAB — COMPREHENSIVE METABOLIC PANEL
ALT: 28 U/L (ref 0–44)
AST: 24 U/L (ref 15–41)
Albumin: 3.7 g/dL (ref 3.5–5.0)
Alkaline Phosphatase: 63 U/L (ref 38–126)
Anion gap: 9 (ref 5–15)
BUN: <5 mg/dL — ABNORMAL LOW (ref 6–20)
CO2: 21 mmol/L — ABNORMAL LOW (ref 22–32)
Calcium: 8.3 mg/dL — ABNORMAL LOW (ref 8.9–10.3)
Chloride: 104 mmol/L (ref 98–111)
Creatinine, Ser: 0.51 mg/dL (ref 0.44–1.00)
GFR calc Af Amer: >60 mL/min (ref >60)
GFR calc non Af Amer: >60 mL/min (ref >60)
Glucose, Bld: 81 mg/dL (ref 70–99)
Potassium: 3.2 mmol/L — ABNORMAL LOW (ref 3.5–5.1)
Sodium: 134 mmol/L — ABNORMAL LOW (ref 135–145)
Total Bilirubin: 0.3 mg/dL (ref 0.3–1.2)
Total Protein: 7.2 g/dL (ref 6.5–8.1)

## 2019-01-07 LAB — CBC
HCT: 36.6 % (ref 36.0–46.0)
Hemoglobin: 11.5 g/dL — ABNORMAL LOW (ref 12.0–15.0)
MCH: 23.1 pg — ABNORMAL LOW (ref 26.0–34.0)
MCHC: 31.4 g/dL (ref 30.0–36.0)
MCV: 73.5 fL — ABNORMAL LOW (ref 80.0–100.0)
Platelets: 242 10*3/uL (ref 150–400)
RBC: 4.98 MIL/uL (ref 3.87–5.11)
RDW: 15.1 % (ref 11.5–15.5)
WBC: 8.9 10*3/uL (ref 4.0–10.5)
nRBC: 0 % (ref 0.0–0.2)

## 2019-01-07 LAB — LIPASE, BLOOD: Lipase: 25 U/L (ref 11–51)

## 2019-01-07 MED ORDER — CEPHALEXIN 500 MG PO CAPS: 500.0000 mg | ORAL_CAPSULE | Freq: Two times a day (BID) | ORAL | 0 refills | Status: AC

## 2019-01-07 MED ORDER — ACETAMINOPHEN 500 MG PO TABS
1000.0000 mg | ORAL_TABLET | Freq: Once | ORAL | Status: AC
  Administered 2019-01-07: 1000 mg via ORAL
  Filled 2019-01-07: qty 2

## 2019-01-07 NOTE — ED Provider Notes (Signed)
[facility] Emergency Department Provider Note [facility] ____________________________________________   I have reviewed the triage vital signs and the nursing notes. Where available I have reviewed prior notes and, if possible and indicated, outside hospital notes.    HISTORY  Chief Complaint Nausea    HPI Pamela Cole is a 23 y.o. female resents after getting a tooth pulled.  She is pregnant, she states that she became very lightheaded after they pulled the tooth.  She states she was very very anxious about getting the tooth pulled.  She did not pass out she states.  She remembers what happened.  There is no accompanying note from her dentist that I can see.  She states she feels fine now.  She thinks she just overreacted with a pulled her tooth.  She is had no vaginal discharge no gush of fluid no urinary or pregnancy related complaints.     Past Medical History:  Diagnosis Date  . Anemia     Patient Active Problem List   Diagnosis Date Noted  . Iron deficiency anemia 09/24/2016  . Labor and delivery, indication for care 06/07/2016  . Indication for care in labor and delivery, delivered 06/07/2016  . Supervision of high risk pregnancy in third trimester 06/01/2016  . IUGR (intrauterine growth restriction) affecting care of mother, third trimester, other fetus 05/29/2016  . Oligohydramnios antepartum 05/29/2016  . Pregnancy 05/25/2016  . Indication for care in labor or delivery 04/28/2016  . Indication for care in labor and delivery, antepartum 04/17/2016  . Anemia affecting pregnancy in third trimester 04/17/2016  . Abdominal pain affecting pregnancy 04/01/2016    Past Surgical History:  Procedure Laterality Date  . NO PAST SURGERIES    . TOOTH EXTRACTION Left 01/07/2019    Prior to Admission medications   Medication Sig Start Date End Date Taking? Authorizing Provider  amoxicillin (AMOXIL) 400 MG/5ML suspension Take 10.9 mLs (875 mg  total) by mouth 2 (two) times daily for 7 days. 01/05/19 01/12/19  Fisher, Roselyn BeringSusan W, PA-C  amoxicillin (AMOXIL) 875 MG tablet Take 1 tablet (875 mg total) by mouth 2 (two) times daily. 01/05/19   Fisher, Roselyn BeringSusan W, PA-C  ferrous sulfate 325 (65 FE) MG tablet Take 325 mg by mouth 2 (two) times daily with a meal.     [provider]  ibuprofen (ADVIL,MOTRIN) 600 MG tablet Take 1 tablet (600 mg total) by mouth every 6 (six) hours. Patient not taking: Reported on 09/25/2016 06/08/16   Schermerhorn, Ihor Austinhomas J, MD  medroxyPROGESTERone (DEPO-PROVERA) 150 MG/ML injection Inject 1 mL (150 mg total) into the muscle once. 06/08/16 06/08/16  Schermerhorn, Ihor Austinhomas J, MD  ondansetron (ZOFRAN) 4 MG tablet Take 1 tablet (4 mg total) by mouth daily as needed. 03/31/17   Schaevitz, Myra Rudeavid Matthew, MD    Allergies Patient has no known allergies.  History reviewed. No pertinent family history.  Social History Social History   Tobacco Use  . Smoking status: Never Smoker  . Smokeless tobacco: Never Used  Substance Use Topics  . Alcohol use: No  . Drug use: No    Review of Systems Constitutional: No fever/chills Eyes: No visual changes. ENT: No sore throat. No stiff neck no neck pain Cardiovascular: Denies chest pain. Respiratory: Denies shortness of breath. Gastrointestinal:   no vomiting.  No diarrhea.  No constipation. Genitourinary: Negative for dysuria. Musculoskeletal: Negative lower extremity swelling Skin: Negative for rash. Neurological: Negative for severe headaches, focal weakness or numbness.   ____________________________________________  PHYSICAL EXAM:  VITAL SIGNS: ED Triage Vitals  Enc Vitals Group     BP 01/07/19 1822 118/81     Pulse Rate 01/07/19 1822 87     Resp 01/07/19 1822 14     Temp 01/07/19 1822 98.3 F (36.8 C)     Temp Source 01/07/19 1822 Oral     SpO2 01/07/19 1822 99 %     Weight 01/07/19 1815 140 lb (63.5 kg)     Height 01/07/19 1815 4\' 11"  (1.499 m)      Head Circumference --      Peak Flow --      Pain Score 01/07/19 1815 5     Pain Loc --      Pain Edu? --      Excl. in GC? --     Constitutional: Alert and oriented. Well appearing and in no acute distress. Eyes: Conjunctivae are normal Head: Atraumatic HEENT: No congestion/rhinnorhea. Mucous membranes are moist.  Oropharynx non-erythematous the area where the tooth pulled is clean dry and intact Neck:   Nontender with no meningismus, no masses, no stridor Cardiovascular: Normal rate, regular rhythm. Grossly normal heart sounds.  Good peripheral circulation. Respiratory: Normal respiratory effort.  No retractions. Lungs CTAB. Abdominal: Soft and nontender. No distention. No guarding no rebound Back:  There is no focal tenderness or step off.  there is no midline tenderness there are no lesions noted. there is no CVA tenderness Musculoskeletal: No lower extremity tenderness, no upper extremity tenderness. No joint effusions, no DVT signs strong distal pulses no edema Neurologic:  Normal speech and language. No gross focal neurologic deficits are appreciated.  Skin:  Skin is warm, dry and intact. No rash noted. Psychiatric: Mood and affect are normal. Speech and behavior are normal.  ____________________________________________   LABS (all labs ordered are listed, but only abnormal results are displayed)  Labs Reviewed  COMPREHENSIVE METABOLIC PANEL - Abnormal; Notable for the following components:      Result Value   Sodium 134 (*)    Potassium 3.2 (*)    CO2 21 (*)    BUN <5 (*)    Calcium 8.3 (*)    All other components within normal limits  CBC - Abnormal; Notable for the following components:   Hemoglobin 11.5 (*)    MCV 73.5 (*)    MCH 23.1 (*)    All other components within normal limits  URINALYSIS, COMPLETE (UACMP) WITH MICROSCOPIC - Abnormal; Notable for the following components:   Color, Urine YELLOW (*)    APPearance CLOUDY (*)    Ketones, ur 20 (*)     Leukocytes,Ua MODERATE (*)    Bacteria, UA FEW (*)    All other components within normal limits  URINE CULTURE  LIPASE, BLOOD    Pertinent labs  results that were available during my care of the patient were reviewed by me and considered in my medical decision making (see chart for details). ____________________________________________  EKG  I personally interpreted any EKGs ordered by me or triage Sinus rate 88 no acute ST elevation or depression nonspecific ST changes, right bundle branch block, partial, normal axis ____________________________________________  RADIOLOGY  Pertinent labs & imaging results that were available during my care of the patient were reviewed by me and considered in my medical decision making (see chart for details). If possible, patient and/or family made aware of any abnormal findings.  No results found. ____________________________________________    PROCEDURES  Procedure(s) performed: None  Procedures  Critical Care performed: None  ____________________________________________   INITIAL IMPRESSION / ASSESSMENT AND PLAN / ED COURSE  Pertinent labs & imaging results that were available during my care of the patient were reviewed by me and considered in my medical decision making (see chart for details).  Patient apparently had a period of feeling groggy after tooth was pulled and she feels fine now, no pregnancy related complaints.  I did an extensive work-up which is reassuring, I did a urinalysis which shows questionable UTI and sent a urine culture we will give her antibiotics, most likely though this is a medication response.  Patient closely with her dentist.  I will obtain fetal heart tones prior to discharge.  ----------------------------------------- 8:24 PM on 01/07/2019 -----------------------------------------  Heart tones 140 good fetal motion, on informal bedside ultrasound.  And ambulatory eating and drinking, we will discharge  with close outpatient follow-up.   ____________________________________________   FINAL CLINICAL IMPRESSION(S) / ED DIAGNOSES  Final diagnoses:  None      This chart was dictated using voice recognition software.  Despite best efforts to proofread,  errors can occur which can change meaning.      Jeanmarie Plant, MD 01/07/19 2024

## 2019-01-07 NOTE — Discharge Instructions (Signed)
Drink plenty of fluids, call your dentist first thing tomorrow.  Return to the emergency room for any new or worrisome symptoms including abdominal pain vomiting fever chills or if you feel worse in any way.

## 2019-01-07 NOTE — ED Triage Notes (Signed)
Pt to ED via EMS from [facility] after tooth extraction today, called out for being slow to respond.  Pt given lidocaine at facility and 4mg  zofran via EMS.  Pt presents alert and oriented/  Also c/o nausea and vomiting.

## 2019-01-09 LAB — OB RESULTS CONSOLE RUBELLA ANTIBODY, IGM: Rubella: IMMUNE

## 2019-01-09 LAB — URINE CULTURE: Culture: <10000 — AB

## 2019-01-09 LAB — OB RESULTS CONSOLE VARICELLA ZOSTER ANTIBODY, IGG: Varicella: IMMUNE

## 2019-01-09 LAB — OB RESULTS CONSOLE ABO/RH: RH Type: POSITIVE

## 2019-01-09 LAB — OB RESULTS CONSOLE GC/CHLAMYDIA
Chlamydia: NEGATIVE
Gonorrhea: NEGATIVE

## 2019-01-09 LAB — OB RESULTS CONSOLE RPR: RPR: NONREACTIVE

## 2019-01-09 LAB — OB RESULTS CONSOLE HEPATITIS B SURFACE ANTIGEN: Hepatitis B Surface Ag: NEGATIVE

## 2019-01-09 LAB — OB RESULTS CONSOLE ANTIBODY SCREEN: Antibody Screen: NEGATIVE

## 2019-01-30 DIAGNOSIS — R87619 Unspecified abnormal cytological findings in specimens from cervix uteri: Secondary | ICD-10-CM

## 2019-01-30 DIAGNOSIS — Z3482 Encounter for supervision of other normal pregnancy, second trimester: Secondary | ICD-10-CM

## 2019-01-30 DIAGNOSIS — Z3480 Encounter for supervision of other normal pregnancy, unspecified trimester: Secondary | ICD-10-CM | POA: Insufficient documentation

## 2019-02-06 ENCOUNTER — Ambulatory Visit: Payer: Self-pay

## 2019-02-12 ENCOUNTER — Ambulatory Visit: Payer: Self-pay | Admitting: Nurse Practitioner

## 2019-02-12 ENCOUNTER — Other Ambulatory Visit: Payer: Self-pay

## 2019-02-12 DIAGNOSIS — O26899 Other specified pregnancy related conditions, unspecified trimester: Secondary | ICD-10-CM

## 2019-02-12 DIAGNOSIS — R197 Diarrhea, unspecified: Secondary | ICD-10-CM

## 2019-02-12 DIAGNOSIS — Z3490 Encounter for supervision of normal pregnancy, unspecified, unspecified trimester: Secondary | ICD-10-CM

## 2019-02-12 NOTE — Progress Notes (Addendum)
10:20 ATC x1 for telehealth phone appt. No answer, LMTC.  10:40 same as above. x2  11:00 same as above. X3. [facility] and reschedule this appt  Patient returned phone call from this am scheduled Telehealth appt. Patient states she did not have her phone with her this morning but has it now. RN stressed importance of patient having phone available at scheduled time of Telehealth appt. Patient taking PNV QD, denies all s/s or exposure to Covid-19 except she reports "watery diarrhea x 2 days." Patient also complaining of low back pain. Patient reports going to [facility] ED (unknown date) for dental concerns. Patient reports she does not have a home BP cuff or scales to assess for BP and weight. 4 week MH RV scheduled for 03/12/2019 @ 3:20. Instructed to be here at 3:00 for check in.

## 2019-02-12 NOTE — Patient Instructions (Signed)
Diarrhea, Adult Diarrhea is frequent loose and watery bowel movements. Diarrhea can make you feel weak and cause you to become dehydrated. Dehydration can make you tired and thirsty, cause you to have a dry mouth, and decrease how often you urinate. Diarrhea typically lasts 2-3 days. However, it can last longer if it is a sign of something more serious. It is important to treat your diarrhea as told by your health care provider. Follow these instructions at home: Eating and drinking     Follow these recommendations as told by your health care provider:  Take an oral rehydration solution (ORS). This is an over-the-counter medicine that helps return your body to its normal balance of nutrients and water. It is found at pharmacies and retail stores.  Drink plenty of fluids, such as water, ice chips, diluted fruit juice, and low-calorie sports drinks. You can drink milk also, if desired.  Avoid drinking fluids that contain a lot of sugar or caffeine, such as energy drinks, sports drinks, and soda.  Eat bland, easy-to-digest foods in small amounts as you are able. These foods include bananas, applesauce, rice, lean meats, toast, and crackers.  Avoid alcohol.  Avoid spicy or fatty foods.  Medicines  Take over-the-counter and prescription medicines only as told by your health care provider.  If you were prescribed an antibiotic medicine, take it as told by your health care provider. Do not stop using the antibiotic even if you start to feel better. General instructions   Wash your hands often using soap and water. If soap and water are not available, use a hand sanitizer. Others in the household should wash their hands as well. Hands should be washed: ? After using the toilet or changing a diaper. ? Before preparing, cooking, or serving food. ? While caring for a sick person or while visiting someone in a hospital.  Drink enough fluid to keep your urine pale yellow.  Rest at home while  you recover.  Watch your condition for any changes.  Take a warm bath to relieve any burning or pain from frequent diarrhea episodes.  Keep all follow-up visits as told by your health care provider. This is important. Contact a health care provider if:  You have a fever.  Your diarrhea gets worse.  You have new symptoms.  You cannot keep fluids down.  You feel light-headed or dizzy.  You have a headache.  You have muscle cramps. Get help right away if:  You have chest pain.  You feel extremely weak or you faint.  You have bloody or black stools or stools that look like tar.  You have severe pain, cramping, or bloating in your abdomen.  You have trouble breathing or you are breathing very quickly.  Your heart is beating very quickly.  Your skin feels cold and clammy.  You feel confused.  You have signs of dehydration, such as: ? Dark urine, very little urine, or no urine. ? Cracked lips. ? Dry mouth. ? Sunken eyes. ? Sleepiness. ? Weakness. Summary  Diarrhea is frequent loose and watery bowel movements. Diarrhea can make you feel weak and cause you to become dehydrated.  Drink enough fluids to keep your urine pale yellow.  Make sure that you wash your hands after using the toilet. If soap and water are not available, use hand sanitizer.  Contact a health care provider if your diarrhea gets worse or you have new symptoms.  Get help right away if you have signs of dehydration. This   information is not intended to replace advice given to you by your health care provider. Make sure you discuss any questions you have with your health care provider. Document Released: 07/14/2002 Document Revised: 12/28/2017 Document Reviewed: 12/28/2017 Elsevier Patient Education  2020 [employer] Inc[employer]  

## 2019-02-12 NOTE — Progress Notes (Addendum)
Client given number to call COVID line for further information for possible exposure - [phone].  TELEPHONE OBSTETRICS VISIT ENCOUNTER NOTE  I connected with Pamela Cole on 02/12/19 at 10:20 AM EDT by telephone at home and verified that I am speaking with the correct person using two identifiers.   I discussed the limitations, risks, security and privacy concerns of performing an evaluation and management service by telephone and the availability of in person appointments. I also discussed with the patient that there may be a patient responsible charge related to this service. The patient expressed understanding and agreed to proceed.  Subjective:  Pamela Cole is a 23 y.o. G3P1002 at [redacted]w[redacted]d being followed for ongoing prenatal care.  She is currently monitored for the following issues for this low-risk pregnancy and has Abdominal pain affecting pregnancy; Indication for care in labor and delivery, antepartum; Anemia affecting pregnancy in third trimester; Indication for care in labor or delivery; Pregnancy; IUGR (intrauterine growth restriction) affecting care of mother, third trimester, other fetus; Oligohydramnios antepartum; Supervision of high risk pregnancy in third trimester; Labor and delivery, indication for care; Indication for care in labor and delivery, delivered; Iron deficiency anemia; Abnormal cervical cytology; and Supervision of normal intrauterine pregnancy in multigravida, second trimester on their problem list.  Patient reports diarrhea x 2 days. Reports fetal movement. Denies any contractions, bleeding or leaking of fluid.   The following portions of the patient's history were reviewed and updated as appropriate: allergies, current medications, past family history, past medical history, past social history, past surgical history and problem list.   Objective:   General:  Alert, oriented and cooperative.   Mental Status: Normal mood and affect perceived.  Normal judgment and thought content.  Rest of physical exam deferred due to type of encounter  Assessment and Plan:  Pregnancy: G3P1002 at [redacted]w[redacted]d There are no diagnoses linked to this encounter. Preterm labor symptoms and general obstetric precautions including but not limited to vaginal bleeding, contractions, leaking of fluid and fetal movement were reviewed in detail with the patient.  I discussed the assessment and treatment plan with the patient. The patient was provided an opportunity to ask questions and all were answered. Discussed with client to RTC or ED if diarrhea persists or worsens.  The patient agreed with the plan and demonstrated an understanding of the instructions. The patient was advised to call back or seek an in-person office evaluation/go to the hospital for any urgent or concerning symptoms.  Please refer to After Visit Summary for other counseling recommendations.   I provided 8 minutes of non-face-to-face time during this encounter.  No follow-ups on file.  Future Appointments  Date Time Provider [place]  03/12/2019  3:20 PM [facility] PROVIDER [facility] None    Berniece Andreas, NP

## 2019-02-18 ENCOUNTER — Encounter: Payer: Self-pay | Admitting: Family Medicine

## 2019-02-18 DIAGNOSIS — O09299 Supervision of pregnancy with other poor reproductive or obstetric history, unspecified trimester: Secondary | ICD-10-CM | POA: Insufficient documentation

## 2019-02-18 NOTE — Progress Notes (Signed)
Abstracted information.

## 2019-03-10 ENCOUNTER — Telehealth: Payer: Self-pay

## 2019-03-10 NOTE — Telephone Encounter (Signed)
TC to patient to reschedule 03/12/2019 3:20 appointment to another day. No provider will be available that afternoon and patient needs to be rescheduled for maternity appointment to an alternative day/time. LM with number to call.Jenetta Downer, RN

## 2019-03-11 ENCOUNTER — Other Ambulatory Visit: Payer: Self-pay

## 2019-03-11 ENCOUNTER — Ambulatory Visit: Payer: Self-pay

## 2019-03-11 ENCOUNTER — Ambulatory Visit: Payer: Self-pay | Admitting: Nurse Practitioner

## 2019-03-11 VITALS — BP 102/65 | Temp 97.7°F | Wt 144.0 lb

## 2019-03-11 DIAGNOSIS — Z3480 Encounter for supervision of other normal pregnancy, unspecified trimester: Secondary | ICD-10-CM

## 2019-03-11 DIAGNOSIS — F5089 Other specified eating disorder: Secondary | ICD-10-CM

## 2019-03-11 NOTE — Progress Notes (Signed)
Denies s/s or exposure to Covid-19. Taking PNV QD, denies ED/hospital visits since last RV. Hal Morales, RN

## 2019-03-11 NOTE — Progress Notes (Signed)
  PRENATAL VISIT NOTE  Subjective:  Pamela Cole is a 23 y.o. G3P1002 at [redacted]w[redacted]d being seen today for ongoing prenatal care.  She is currently monitored for the following issues for this low-risk pregnancy and has Anemia affecting pregnancy in third trimester; Abnormal cervical cytology; Supervision of other normal pregnancy, antepartum; and IUGR (intrauterine growth retardation) in prior pregnancy, pregnant on their problem list.  Patient reports no complaints.   .  .  Movement: Present. Denies leaking of fluid/ROM.   The following portions of the patient's history were reviewed and updated as appropriate: allergies, current medications, past family history, past medical history, past social history, past surgical history and problem list. Problem list updated.  Objective:   Vitals:   03/11/19 1539  BP: 102/65  Temp: 97.7 F (36.5 C)  Weight: 144 lb (65.3 kg)    Fetal Status: Fetal Heart Rate (bpm): 150 Fundal Height: 20 cm Movement: Present     General:  Alert, oriented and cooperative. Patient is in no acute distress.  Skin: Skin is warm and dry. No rash noted.   Cardiovascular: Normal heart rate noted  Respiratory: Normal respiratory effort, no problems with respiration noted  Abdomen: Soft, gravid, appropriate for gestational age.        Pelvic: Cervical exam deferred        Extremities: Normal range of motion.     Mental Status: Normal mood and affect. Normal behavior. Normal judgment and thought content.   Assessment and Plan:  Pregnancy: G3P1002 at [redacted]w[redacted]d  1. Supervision of other normal pregnancy, antepartum   2. 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 Please refer to After Visit Summary for other counseling recommendations.    Return in about 4 weeks (around 04/08/2019) for routine prenatal care - Obtain hgb level (PICA).  No future appointments.  Berniece Andreas, NP  .

## 2019-03-12 ENCOUNTER — Ambulatory Visit: Payer: Self-pay

## 2019-04-17 ENCOUNTER — Ambulatory Visit: Payer: Self-pay

## 2019-04-18 ENCOUNTER — Other Ambulatory Visit: Payer: Self-pay

## 2019-04-18 ENCOUNTER — Encounter: Payer: Self-pay | Admitting: Family Medicine

## 2019-04-18 ENCOUNTER — Ambulatory Visit: Payer: Self-pay | Admitting: Family Medicine

## 2019-04-18 VITALS — BP 101/68 | Temp 97.7°F | Wt 149.4 lb

## 2019-04-18 DIAGNOSIS — O09299 Supervision of pregnancy with other poor reproductive or obstetric history, unspecified trimester: Secondary | ICD-10-CM

## 2019-04-18 DIAGNOSIS — O99013 Anemia complicating pregnancy, third trimester: Secondary | ICD-10-CM

## 2019-04-18 DIAGNOSIS — Z3480 Encounter for supervision of other normal pregnancy, unspecified trimester: Secondary | ICD-10-CM

## 2019-04-18 NOTE — Progress Notes (Signed)
PRENATAL VISIT NOTE  Subjective:  Pamela Cole is a 23 y.o. G3P1002 at [redacted]w[redacted]d being seen today for ongoing prenatal care.  She is currently monitored for the following issues for this low-risk pregnancy and has Anemia affecting pregnancy in third trimester; Abnormal cervical cytology; Supervision of other normal pregnancy, antepartum; and IUGR (intrauterine growth retardation) in prior pregnancy, pregnant on their problem list.  Patient reports no complaints.  Contractions: Not present. Vag. Bleeding: None.  Movement: Present. Denies leaking of fluid/ROM.   The following portions of the patient's history were reviewed and updated as appropriate: allergies, current medications, past family history, past medical history, past social history, past surgical history and problem list. Problem list updated.  Objective:   Vitals:   04/18/19 1059  BP: 101/68  Temp: 97.7 F (36.5 C)  Weight: 149 lb 6.4 oz (67.8 kg)    Fetal Status:   Fundal Height: 25 cm Movement: Present     General:  Alert, oriented and cooperative. Patient is in no acute distress.  Skin: Skin is warm and dry. No rash noted.   Cardiovascular: Normal heart rate noted  Respiratory: Normal respiratory effort, no problems with respiration noted  Abdomen: Soft, gravid, appropriate for gestational age.  Pain/Pressure: Present     Pelvic: Cervical exam deferred        Extremities: Normal range of motion.  Edema: None  Mental Status: Normal mood and affect. Normal behavior. Normal judgment and thought content.   Assessment and Plan:  Pregnancy: G3P1002 at [redacted]w[redacted]d  1. IUGR (intrauterine growth retardation) in prior pregnancy, pregnant Monitor FH, currently as expected  2. Supervision of other normal pregnancy, antepartum Up to date currently Reviewed pregnancy care  Reviewed complaint of belly tightening intermittently-reviewed PTL precautions.   Preterm labor symptoms and general obstetric precautions including  but not limited to vaginal bleeding, contractions, leaking of fluid and fetal movement were reviewed in detail with the patient. Please refer to After Visit Summary for other counseling recommendations.   Return in about 4 weeks (around 05/16/2019) for Routine prenatal care, 28 wk labs.  Future Appointments  Date Time Provider [place]  05/16/2019  9:00 AM [facility] PROVIDER [facility] None    Caren Macadam, MD

## 2019-04-18 NOTE — Progress Notes (Signed)
In for visit; denies hospital visits since last appt.; taking PNV; [place] today; [facility] Info given Debera Lat, RN

## 2019-04-24 ENCOUNTER — Observation Stay
Admission: EM | Admit: 2019-04-24 | Discharge: 2019-04-24 | Disposition: A | Discharge status: Home / Self Care | Payer: Self-pay | Attending: Certified Nurse Midwife | Admitting: Certified Nurse Midwife

## 2019-04-24 ENCOUNTER — Other Ambulatory Visit: Payer: Self-pay

## 2019-04-24 ENCOUNTER — Telehealth: Payer: Self-pay

## 2019-04-24 DIAGNOSIS — Z3480 Encounter for supervision of other normal pregnancy, unspecified trimester: Secondary | ICD-10-CM

## 2019-04-24 DIAGNOSIS — O09299 Supervision of pregnancy with other poor reproductive or obstetric history, unspecified trimester: Secondary | ICD-10-CM

## 2019-04-24 DIAGNOSIS — O4702 False labor before 37 completed weeks of gestation, second trimester: Secondary | ICD-10-CM | POA: Insufficient documentation

## 2019-04-24 DIAGNOSIS — D649 Anemia, unspecified: Secondary | ICD-10-CM | POA: Insufficient documentation

## 2019-04-24 DIAGNOSIS — B9689 Other specified bacterial agents as the cause of diseases classified elsewhere: Secondary | ICD-10-CM | POA: Insufficient documentation

## 2019-04-24 DIAGNOSIS — Z3A26 26 weeks gestation of pregnancy: Secondary | ICD-10-CM | POA: Insufficient documentation

## 2019-04-24 DIAGNOSIS — O4692 Antepartum hemorrhage, unspecified, second trimester: Principal | ICD-10-CM | POA: Diagnosis present

## 2019-04-24 DIAGNOSIS — Z79899 Other long term (current) drug therapy: Secondary | ICD-10-CM | POA: Insufficient documentation

## 2019-04-24 DIAGNOSIS — O99012 Anemia complicating pregnancy, second trimester: Secondary | ICD-10-CM | POA: Insufficient documentation

## 2019-04-24 DIAGNOSIS — O23592 Infection of other part of genital tract in pregnancy, second trimester: Secondary | ICD-10-CM | POA: Insufficient documentation

## 2019-04-24 LAB — CBC
HCT: 29 % — ABNORMAL LOW (ref 36.0–46.0)
Hemoglobin: 8.8 g/dL — ABNORMAL LOW (ref 12.0–15.0)
MCH: 21.4 pg — ABNORMAL LOW (ref 26.0–34.0)
MCHC: 30.3 g/dL (ref 30.0–36.0)
MCV: 70.6 fL — ABNORMAL LOW (ref 80.0–100.0)
Platelets: 292 10*3/uL (ref 150–400)
RBC: 4.11 MIL/uL (ref 3.87–5.11)
RDW: 15.3 % (ref 11.5–15.5)
WBC: 10.8 10*3/uL — ABNORMAL HIGH (ref 4.0–10.5)
nRBC: 0 % (ref 0.0–0.2)

## 2019-04-24 LAB — URINALYSIS, COMPLETE (UACMP) WITH MICROSCOPIC
Bilirubin Urine: NEGATIVE
Glucose, UA: NEGATIVE mg/dL
Hgb urine dipstick: NEGATIVE
Ketones, ur: NEGATIVE mg/dL
Nitrite: NEGATIVE
Protein, ur: NEGATIVE mg/dL
Specific Gravity, Urine: 1.017 (ref 1.005–1.030)
pH: 7 (ref 5.0–8.0)

## 2019-04-24 LAB — WET PREP, GENITAL
Sperm: NONE SEEN
Trich, Wet Prep: NONE SEEN
Yeast Wet Prep HPF POC: NONE SEEN

## 2019-04-24 LAB — RETIC PANEL
Immature Retic Fract: 20.5 % — ABNORMAL HIGH (ref 2.3–15.9)
RBC.: 4.11 MIL/uL (ref 3.87–5.11)
Retic Count, Absolute: 69 10*3/uL (ref 19.0–186.0)
Retic Ct Pct: 1.7 % (ref 0.4–3.1)
Reticulocyte Hemoglobin: 19.8 pg — ABNORMAL LOW (ref >27.9)

## 2019-04-24 LAB — CHLAMYDIA/NGC RT PCR (ARMC ONLY)
Chlamydia Tr: NOT DETECTED
N gonorrhoeae: NOT DETECTED

## 2019-04-24 MED ORDER — VITAMIN C 250 MG PO TABS: 250.0000 mg | ORAL_TABLET | Freq: Two times a day (BID) | ORAL | 3 refills | Status: DC

## 2019-04-24 MED ORDER — METRONIDAZOLE 500 MG PO TABS: 500.0000 mg | ORAL_TABLET | Freq: Two times a day (BID) | ORAL | 0 refills | Status: AC

## 2019-04-24 MED ORDER — FERROUS SULFATE 325 (65 FE) MG PO TABS: 325.0000 mg | ORAL_TABLET | Freq: Two times a day (BID) | ORAL | 3 refills | Status: DC

## 2019-04-24 NOTE — Discharge Summary (Signed)
Pamela Cole is a 23 y.o. female. She is at 5658w5d gestation. No LMP recorded (lmp unknown). Patient is pregnant. Estimated Date of Delivery: 07/26/19  Prenatal care site: [facility]   Chief complaint: vaginal bleeding Location: vagina Onset/timing: yesterday morning Duration: intermittent Quality: spotting to small amount of bleeding with using the bathroom Severity: mild Aggravating or alleviating conditions: more bleeding with defecation than with urination, leg cramps Associated signs/symptoms: Braxton Hicks contractions Context: Pamela Cole reports vaginal bleeding that started yesterday morning. She reports that she has noticed it every time that she uses the bathroom, but not in between. She has not worn a panty liner or a pad at all. She reports that the bleeding is bright red and spotting when she pees. When she moves her bowels, the bleeding is more, to the point she has to wipe a few times to clear it all. She believes it is coming from the vagina and not her rectum. She also reports intermittent Braxton Hicks contractions and right calf cramps. She reports no recent vaginal sex.   S: Resting comfortably.   She reports:  -active fetal movement -no leakage of fluid -no contractions  Maternal Medical History:   Past Medical History:  Diagnosis Date  . Anemia   . UTI (urinary tract infection)    Hx of UTI x 1    Past Surgical History:  Procedure Laterality Date  . NO PAST SURGERIES    . TOOTH EXTRACTION Left 01/07/2019    No Known Allergies  Prior to Admission medications   Medication Sig Start Date End Date Taking? Authorizing Provider  Prenatal Vit-Fe Fumarate-FA (MULTIVITAMIN-PRENATAL) 27-0.8 MG TABS tablet Take 1 tablet by mouth daily at 12 noon.   Yes [provider]  amoxicillin (AMOXIL) 875 MG tablet Take 1 tablet (875 mg total) by mouth 2 (two) times daily. Patient not taking: Reported on 01/30/2019 01/05/19   Faythe GheeFisher, Susan W, PA-C  ferrous  sulfate 325 (65 FE) MG tablet Take 325 mg by mouth 2 (two) times daily with a meal.     [provider]  ibuprofen (ADVIL,MOTRIN) 600 MG tablet Take 1 tablet (600 mg total) by mouth every 6 (six) hours. Patient not taking: Reported on 09/25/2016 06/08/16   Schermerhorn, Ihor Austinhomas J, MD  ondansetron (ZOFRAN) 4 MG tablet Take 1 tablet (4 mg total) by mouth daily as needed. Patient not taking: Reported on 01/30/2019 03/31/17   Schaevitz, Myra Rudeavid Matthew, MD     Social History: She  reports that she has never smoked. She has never used smokeless tobacco. She reports that she does not drink alcohol or use drugs.  Family History: family history includes Asthma in her brother; Cancer in her maternal grandmother; Hypertension in her mother.   Review of Systems: A full review of systems was performed and negative except as noted in the HPI.     O:  BP 114/68 (BP Location: Right Arm)   Pulse 97   Temp 99.3 F (37.4 C) (Oral)   Resp 18   Ht 4\' 11"  (1.499 m)   Wt 63.5 kg   LMP  (LMP Unknown)   SpO2 99%   BMI 28.28 kg/m  Results for orders placed or performed during the hospital encounter of 04/24/19 (from the past 48 hour(s))  Chlamydia/NGC rt PCR ([facility] only)   Collection Time: 04/24/19  6:54 PM   Specimen: Cervical/Vaginal swab  Result Value Ref Range   Specimen source GC/Chlam ENDOCERVICAL    Chlamydia Tr NOT DETECTED NOT  DETECTED   N gonorrhoeae NOT DETECTED NOT DETECTED  Wet prep, genital   Collection Time: 04/24/19  6:54 PM   Specimen: Cervical/Vaginal swab  Result Value Ref Range   Yeast Wet Prep HPF POC NONE SEEN NONE SEEN   Trich, Wet Prep NONE SEEN NONE SEEN   Clue Cells Wet Prep HPF POC PRESENT (A) NONE SEEN   WBC, Wet Prep HPF POC MANY (A) NONE SEEN   Sperm NONE SEEN   Urinalysis, Complete w Microscopic   Collection Time: 04/24/19  6:54 PM  Result Value Ref Range   Color, Urine YELLOW (A) YELLOW   APPearance TURBID (A) CLEAR   Specific Gravity, Urine 1.017 1.005 -  1.030   pH 7.0 5.0 - 8.0   Glucose, UA NEGATIVE NEGATIVE mg/dL   Hgb urine dipstick NEGATIVE NEGATIVE   Bilirubin Urine NEGATIVE NEGATIVE   Ketones, ur NEGATIVE NEGATIVE mg/dL   Protein, ur NEGATIVE NEGATIVE mg/dL   Nitrite NEGATIVE NEGATIVE   Leukocytes,Ua MODERATE (A) NEGATIVE   RBC / HPF 6-10 0 - 5 RBC/hpf   WBC, UA 11-20 0 - 5 WBC/hpf   Bacteria, UA RARE (A) NONE SEEN   Squamous Epithelial / LPF 6-10 0 - 5   Mucus PRESENT   CBC   Collection Time: 04/24/19  7:07 PM  Result Value Ref Range   WBC 10.8 (H) 4.0 - 10.5 K/uL   RBC 4.11 3.87 - 5.11 MIL/uL   Hemoglobin 8.8 (L) 12.0 - 15.0 g/dL   HCT 60.4 (L) 54.0 - 98.1 %   MCV 70.6 (L) 80.0 - 100.0 fL   MCH 21.4 (L) 26.0 - 34.0 pg   MCHC 30.3 30.0 - 36.0 g/dL   RDW 19.1 47.8 - 29.5 %   Platelets 292 150 - 400 K/uL   nRBC 0.0 0.0 - 0.2 %  Retic Panel   Collection Time: 04/24/19  7:42 PM  Result Value Ref Range   Retic Ct Pct 1.7 0.4 - 3.1 %   RBC. 4.11 3.87 - 5.11 MIL/uL   Retic Count, Absolute 69.0 19.0 - 186.0 K/uL   Immature Retic Fract 20.5 (H) 2.3 - 15.9 %   Reticulocyte Hemoglobin 19.8 (L) >27.9 pg     Constitutional: NAD, AAOx3  HE/ENT: extraocular movements grossly intact, moist mucous membranes CV: RRR PULM: normal respiratory effort, CTABL  Back: symmetric, no CVAT    Abd: gravid, non-tender, non-distended, soft      Ext: Non-tender, Nonedmeatous   Psych: mood appropriate, speech normal Pelvic: SSE: no pooling, no active bleeding or old blood, small amount of physiologic discharge present, cervix visually closed External rectum: no external hemorrhoids, no old blood  NST:  Baseline: 150-155bpm Variability: moderate Accelerations: 10x10 present x >2 Decelerations: absent Time: Toco: quiet   A/P: 23 y.o. [redacted]w[redacted]d here for antenatal surveillance during pregnancy.  Principle diagnosis: vaginal bleeding during pregnancy  Labor  Not present  Fetal Wellbeing  Reactive NST, reassuring for  GA  Vaginal bleeding during pregnancy  No active bleeding present, though cervix looks irritated  GC/CT both negative  UA with signs of possible UTI vs skin contamination, urine culture added  Platelets WNL  Anemia  Hemoglobin 8.8.  Advised to add twice daily iron supplement with vitamin C supplement to add absorption to daily prenatal vitamin. Written information given.   Bacterial vaginosis  Wet prep with clue cells, consistent with BV  Prescription for metronidazole 500mg  BID x 7 days send to preferred pharmacy  D/c home stable, precautions  reviewed, follow-up as scheduled.    Lisette Grinder 04/24/2019 9:12 PM  ----- Lisette Grinder, CNM Certified Nurse Midwife [facility], Department of [facility]

## 2019-04-24 NOTE — OB Triage Note (Signed)
Discharge instructions provided and reviewed.  Prescriptions and follow up care discussed. Pt verbalizes understanding.

## 2019-04-24 NOTE — OB Triage Note (Signed)
Pt presents c/o bleeding that she noticed yesterday when using the bathroom. (pt has a picture on her cell phone). Pt states she had more bleeding today which is why she came in. Pt states she has tightening of the abdomen that comes and goes and a cramp that comes and goes in her right calf. Denies recent intercourse. Denies LOF. Reports positive fetal movement. Vitals WNL. Will continue to monitor.

## 2019-04-24 NOTE — Telephone Encounter (Signed)
Patient called today reporting symptoms of bright red vaginal spotting when she wipes since yesterday. Patient states that today she has noticed that the bleeding has increased each time she has a bowel movement. Patient states baby is moving well. Patient states that her "belly is at times getting really tight and uncomfortable." Patient also states she has been having "cramps in her lower legs" since this morning. RN consulted with provider A. Streilein, PA regarding above symptoms. Provider recommends that patient go to ED for evaluation. Above recommendations given to patient which is agreeable with plan. Hal Morales, RN

## 2019-04-25 ENCOUNTER — Telehealth: Payer: Self-pay

## 2019-04-25 NOTE — Telephone Encounter (Signed)
TC to patient to inform of [place] U/S on 05/01/2019 at 11:00. Patient to arrive at 10:45am. Patient informed she will need to take $100 with her to appointment and will be billed $100 later. Patient asked if she could take the total amount with her, and was told that that would be fine--as long as she takes at least $100. Patient states understanding and denies questions at this time.Jenetta Downer, RN

## 2019-04-25 NOTE — Telephone Encounter (Signed)
Phone call to patient to notify of scheduled [facility] Anatomy Scan. No answer, LMTC. Hal Morales, RN

## 2019-04-26 LAB — URINE CULTURE: Culture: 80000 — AB

## 2019-05-16 ENCOUNTER — Ambulatory Visit: Payer: Self-pay | Admitting: Advanced Practice Midwife

## 2019-05-16 ENCOUNTER — Other Ambulatory Visit: Payer: Self-pay

## 2019-05-16 VITALS — BP 97/63 | Temp 97.3°F | Wt 151.2 lb

## 2019-05-16 DIAGNOSIS — Z3480 Encounter for supervision of other normal pregnancy, unspecified trimester: Secondary | ICD-10-CM

## 2019-05-16 DIAGNOSIS — O09299 Supervision of pregnancy with other poor reproductive or obstetric history, unspecified trimester: Secondary | ICD-10-CM

## 2019-05-16 DIAGNOSIS — O99013 Anemia complicating pregnancy, third trimester: Secondary | ICD-10-CM

## 2019-05-16 DIAGNOSIS — Z23 Encounter for immunization: Secondary | ICD-10-CM

## 2019-05-16 LAB — HEMOGLOBIN, FINGERSTICK: Hemoglobin: 8.7 g/dL — ABNORMAL LOW (ref 11.1–15.9)

## 2019-05-16 LAB — HIV ANTIBODY (ROUTINE TESTING W REFLEX): HIV 1&2 Ab, 4th Generation: NONREACTIVE

## 2019-05-16 MED ORDER — FERROUS SULFATE 324 (65 FE) MG PO TBEC: 1.0000 | DELAYED_RELEASE_TABLET | Freq: Three times a day (TID) | ORAL | 0 refills | Status: DC

## 2019-05-16 NOTE — Progress Notes (Signed)
   PRENATAL VISIT NOTE  Subjective:  Pamela Cole is a 23 y.o. G3P2002 at [redacted]w[redacted]d being seen today for ongoing prenatal care.  She is currently monitored for the following issues for this high-risk pregnancy and has Anemia affecting pregnancy in third trimester; Abnormal cervical cytology; Supervision of other normal pregnancy, antepartum; IUGR (intrauterine growth retardation) in prior pregnancy, pregnant; and Vaginal bleeding in pregnancy, second trimester on their problem list.  Patient reports no complaints.  Contractions: Not present. Vag. Bleeding: None.  Movement: Present. Denies leaking of fluid/ROM.   The following portions of the patient's history were reviewed and updated as appropriate: allergies, current medications, past family history, past medical history, past social history, past surgical history and problem list. Problem list updated.  Objective:   Vitals:   05/16/19 0906  BP: 97/63  Temp: (!) 97.3 F (36.3 C)  Weight: 151 lb 3.2 oz (68.6 kg)    Fetal Status: Fetal Heart Rate (bpm): 140 Fundal Height: 29 cm Movement: Present     General:  Alert, oriented and cooperative. Patient is in no acute distress.  Skin: Skin is warm and dry. No rash noted.   Cardiovascular: Normal heart rate noted  Respiratory: Normal respiratory effort, no problems with respiration noted  Abdomen: Soft, gravid, appropriate for gestational age.  Pain/Pressure: Present     Pelvic: Cervical exam deferred        Extremities: Normal range of motion.  Edema: None  Mental Status: Normal mood and affect. Normal behavior. Normal judgment and thought content.   Assessment and Plan:  Pregnancy: G3P2002 at [redacted]w[redacted]d  1. IUGR (intrauterine growth retardation) in prior pregnancy, pregnant .2. Supervision of other normal pregnancy, antepartum Blood transfusion consent form signed.  Here with her mom whom she lives with.  Not employed.  Not taking FeSo4 - Glucose, 1 hour gestational - RPR -  HIV [place] [facility] - Tdap vaccine greater than or equal to 7yo IM - Flu Vaccine QUAD 36+ mos IM  3. Flu vaccine need  - Flu Vaccine QUAD 36+ mos IM  4. Need for diphtheria-tetanus-pertussis (Tdap) vaccine  - Tdap vaccine greater than or equal to 7yo IM   Preterm labor symptoms and general obstetric precautions including but not limited to vaginal bleeding, contractions, leaking of fluid and fetal movement were reviewed in detail with the patient. Please refer to After Visit Summary for other counseling recommendations.  No follow-ups on file.  No future appointments.  Herbie Saxon, CNM

## 2019-05-16 NOTE — Progress Notes (Addendum)
Here today for 29.6 week MH RV. Taking PNV QD, denies ED/hospital visits since last RV. 28 week labs, Tdap and Flu vaccine today. Needs [facility] Blood Transfusion Consent Form signed. Hal Morales, RN Hgb 8.7. Anemia Panel today. Iron given with instructions to take TID with OJ or Vitamin C source as well to take PNV at a separate time than Iron. Hal Morales, RN

## 2019-05-17 LAB — FE+CBC/D/PLT+TIBC+FER+RETIC
Basophils Absolute: 0 10*3/uL (ref 0.0–0.2)
Basos: 0 %
EOS (ABSOLUTE): 0.1 10*3/uL (ref 0.0–0.4)
Eos: 1 %
Ferritin: 3 ng/mL — ABNORMAL LOW (ref 15–150)
Hematocrit: 28.5 % — ABNORMAL LOW (ref 34.0–46.6)
Hemoglobin: 8.5 g/dL — ABNORMAL LOW (ref 11.1–15.9)
Immature Grans (Abs): 0.1 10*3/uL (ref 0.0–0.1)
Immature Granulocytes: 1 %
Iron Saturation: 3 % — CL (ref 15–55)
Iron: 22 ug/dL — ABNORMAL LOW (ref 27–159)
Lymphocytes Absolute: 1.9 10*3/uL (ref 0.7–3.1)
Lymphs: 20 %
MCH: 19.9 pg — ABNORMAL LOW (ref 26.6–33.0)
MCHC: 29.8 g/dL — ABNORMAL LOW (ref 31.5–35.7)
MCV: 67 fL — ABNORMAL LOW (ref 79–97)
Monocytes Absolute: 0.4 10*3/uL (ref 0.1–0.9)
Monocytes: 4 %
Neutrophils Absolute: 7.2 10*3/uL — ABNORMAL HIGH (ref 1.4–7.0)
Neutrophils: 74 %
Platelets: 304 10*3/uL (ref 150–450)
RBC: 4.28 x10E6/uL (ref 3.77–5.28)
RDW: 15.8 % — ABNORMAL HIGH (ref 11.7–15.4)
Retic Ct Pct: 1.8 % (ref 0.6–2.6)
Total Iron Binding Capacity: 629 ug/dL (ref 250–450)
UIBC: 607 ug/dL — ABNORMAL HIGH (ref 131–425)
WBC: 9.9 10*3/uL (ref 3.4–10.8)

## 2019-05-17 LAB — RPR: RPR Ser Ql: NONREACTIVE

## 2019-05-17 LAB — GLUCOSE, 1 HOUR GESTATIONAL: Gestational Diabetes Screen: 135 mg/dL (ref 65–139)

## 2019-05-19 ENCOUNTER — Encounter: Payer: Self-pay | Admitting: Advanced Practice Midwife

## 2019-05-19 ENCOUNTER — Telehealth: Payer: Self-pay

## 2019-05-19 DIAGNOSIS — Z9189 Other specified personal risk factors, not elsewhere classified: Secondary | ICD-10-CM | POA: Insufficient documentation

## 2019-05-19 NOTE — Telephone Encounter (Signed)
05/16/2019 one hour glucose test result = 135. Client counseled 3 hour GTT needed and verbal test prep instructions provided with stated understanding. Appt scheduled for tomorrow am. Rich Number, RN

## 2019-05-20 ENCOUNTER — Other Ambulatory Visit: Payer: Self-pay

## 2019-05-20 DIAGNOSIS — Z3483 Encounter for supervision of other normal pregnancy, third trimester: Secondary | ICD-10-CM

## 2019-05-20 NOTE — Progress Notes (Signed)
Pt states the last time she had anything to eat or drink other than small sips of water was around 9:00 pm last night. Pt has times to return to lab. Pt counseled and given instructions for 3 hour GTT lab.

## 2019-05-21 LAB — GLUCOSE TOLERANCE TEST, 6 HOUR
Glucose, 1 Hour GTT: 151 mg/dL (ref 65–199)
Glucose, 2 hour: 104 mg/dL (ref 65–139)
Glucose, 3 hour: 94 mg/dL (ref 65–109)
Glucose, GTT - Fasting: 73 mg/dL (ref 65–99)

## 2019-05-22 ENCOUNTER — Telehealth: Payer: Self-pay

## 2019-05-22 NOTE — Telephone Encounter (Signed)
Client called regarding 3 Hr result-informed WNL; received call from [facility] and scheduled Hematology appt; discussed need for Hematology referral-agrees to appt. Debera Lat, RN

## 2019-05-22 NOTE — Progress Notes (Signed)
[place]  [phone] [phone]  ID: Pamela Cole OB: 1996-02-28  MR#: [id number]  GMW#:[id number]  Patient Care Team: Department, [facility] as PCP - General  CHIEF COMPLAINT: Anemia in pregnancy.  INTERVAL HISTORY: Patient is a 23 year old female in her third trimester pregnancy who was found to have a declining hemoglobin and iron stores.  She had the same problem with a previous pregnancy 3 years ago.  She has increased weakness and fatigue as well as shortness of breath.  She also complains of intermittent abdominal pain.  She has no neurologic complaints.  She denies any recent fevers or illnesses.  She has a good appetite and is gaining weight appropriately.  She has no chest pain, cough, or hemoptysis.  She denies any nausea, vomiting, constipation, or diarrhea.  She has no melena or hematochezia.  She has no urinary complaints.  Patient otherwise feels well and offers no further specific complaints.  REVIEW OF SYSTEMS:   Review of Systems  Constitutional: Positive for malaise/fatigue. Negative for fever and weight loss.  Respiratory: Positive for shortness of breath. Negative for cough and hemoptysis.   Cardiovascular: Negative.  Negative for chest pain and leg swelling.  Gastrointestinal: Positive for abdominal pain. Negative for blood in stool and melena.  Genitourinary: Negative.  Negative for hematuria.  Musculoskeletal: Negative.  Negative for back pain.  Skin: Negative.  Negative for rash.  Neurological: Negative.  Negative for dizziness, focal weakness, weakness and headaches.  Psychiatric/Behavioral: Negative.  The patient is not nervous/anxious.     As per HPI. Otherwise, a complete review of systems is negative.  PAST MEDICAL HISTORY: Past Medical History:  Diagnosis Date  . Anemia   . UTI (urinary tract infection)    Hx of UTI x 1    PAST SURGICAL HISTORY: Past Surgical History:  Procedure  Laterality Date  . NO PAST SURGERIES    . TOOTH EXTRACTION Left 01/07/2019    FAMILY HISTORY: Family History  Problem Relation Age of Onset  . Hypertension Mother   . Asthma Brother   . Breast cancer Maternal Grandmother     ADVANCED DIRECTIVES (Y/N):  N  HEALTH MAINTENANCE: Social History   Tobacco Use  . Smoking status: Never Smoker  . Smokeless tobacco: Never Used  Substance Use Topics  . Alcohol use: No  . Drug use: No     Colonoscopy:  PAP:  Bone density:  Lipid panel:  No Known Allergies  Current Outpatient Medications  Medication Sig Dispense Refill  . ferrous sulfate 324 (65 Fe) MG TBEC Take 1 tablet (325 mg total) by mouth 3 (three) times daily. 100 tablet 0  . ferrous sulfate 325 (65 FE) MG tablet Take 1 tablet (325 mg total) by mouth 2 (two) times daily with a meal. 60 tablet 3  . Prenatal Vit-Fe Fumarate-FA (MULTIVITAMIN-PRENATAL) 27-0.8 MG TABS tablet Take 1 tablet by mouth daily at 12 noon.     No current facility-administered medications for this visit.     OBJECTIVE: Vitals:   05/27/19 1129  BP: 104/69  Pulse: (!) 120  Temp: 98.5 F (36.9 C)     Body mass index is 30.33 kg/m.    ECOG FS:0 - Asymptomatic  General: Well-developed, well-nourished, no acute distress. Eyes: Pink conjunctiva, anicteric sclera. HEENT: Normocephalic, moist mucous membranes, clear oropharnyx. Lungs: Clear to auscultation bilaterally. Heart: Regular rate and rhythm. No rubs, murmurs, or gallops. Abdomen: Appears appropriate for gestational age. Musculoskeletal: No edema, cyanosis, or  clubbing. Neuro: Alert, answering all questions appropriately. Cranial nerves grossly intact. Skin: No rashes or petechiae noted. Psych: Normal affect.  LAB RESULTS:  Lab Results  Component Value Date   NA 134 (L) 01/07/2019   K 3.2 (L) 01/07/2019   CL 104 01/07/2019   CO2 21 (L) 01/07/2019   GLUCOSE 81 01/07/2019   BUN <5 (L) 01/07/2019   CREATININE 0.51 01/07/2019    CALCIUM 8.3 (L) 01/07/2019   PROT 7.2 01/07/2019   ALBUMIN 3.7 01/07/2019   AST 24 01/07/2019   ALT 28 01/07/2019   ALKPHOS 63 01/07/2019   BILITOT 0.3 01/07/2019   GFRNONAA >60 01/07/2019   GFRAA >60 01/07/2019    Lab Results  Component Value Date   WBC 9.9 05/16/2019   NEUTROABS 7.2 (H) 05/16/2019   HGB 8.5 (L) 05/16/2019   HCT 28.5 (L) 05/16/2019   MCV 67 (L) 05/16/2019   PLT 304 05/16/2019   Lab Results  Component Value Date   IRON 22 (L) 05/16/2019   TIBC 629 (HH) 05/16/2019   IRONPCTSAT 3 (LL) 05/16/2019   Lab Results  Component Value Date   FERRITIN 3 (L) 05/16/2019     STUDIES: No results found.  ASSESSMENT: Anemia in pregnancy  PLAN:    1. Anemia in pregnancy: Patient's hemoglobin and iron stores are significantly reduced and she is symptomatic.  Return to clinic tomorrow for 510 mg IV Feraheme.  Patient then return to clinic in 2 weeks for second infusion.  Her due date is approximately July 24, 2019, therefore she will return to clinic the second week of December for further evaluation, repeat laboratory, and continuation of IV iron if necessary. 2.  Shortness of breath/weakness and fatigue: Likely secondary to iron deficiency anemia.  Treatment as above. 3.  Abdominal pain: Patient has been instructed to call her OB as soon as possible for evaluation. 4.  Pregnancy: Patient reports her due date is July 24, 2019.  Patient expressed understanding and was in agreement with this plan. She also understands that She can call clinic at any time with any questions, concerns, or complaints.    Jeralyn Ruths, MD   05/28/2019 6:20 AM

## 2019-05-26 NOTE — Progress Notes (Signed)
Called patient and no answer.

## 2019-05-27 ENCOUNTER — Encounter: Payer: Self-pay | Admitting: Oncology

## 2019-05-27 ENCOUNTER — Other Ambulatory Visit: Payer: Self-pay

## 2019-05-27 ENCOUNTER — Encounter (INDEPENDENT_AMBULATORY_CARE_PROVIDER_SITE_OTHER): Payer: Self-pay

## 2019-05-27 ENCOUNTER — Observation Stay
Admission: EM | Admit: 2019-05-27 | Discharge: 2019-05-27 | Disposition: A | Discharge status: Home / Self Care | Payer: Self-pay | Attending: Obstetrics and Gynecology | Admitting: Obstetrics and Gynecology

## 2019-05-27 ENCOUNTER — Telehealth: Payer: Self-pay | Admitting: Family Medicine

## 2019-05-27 ENCOUNTER — Encounter: Payer: Self-pay | Admitting: *Deleted

## 2019-05-27 ENCOUNTER — Inpatient Hospital Stay: Payer: Self-pay | Attending: Oncology | Admitting: Oncology

## 2019-05-27 VITALS — BP 104/69 | HR 120 | Temp 98.5°F | Ht 60.0 in | Wt 155.3 lb

## 2019-05-27 DIAGNOSIS — O26899 Other specified pregnancy related conditions, unspecified trimester: Secondary | ICD-10-CM | POA: Diagnosis present

## 2019-05-27 DIAGNOSIS — Z3480 Encounter for supervision of other normal pregnancy, unspecified trimester: Secondary | ICD-10-CM

## 2019-05-27 DIAGNOSIS — Z3A31 31 weeks gestation of pregnancy: Secondary | ICD-10-CM | POA: Insufficient documentation

## 2019-05-27 DIAGNOSIS — R531 Weakness: Secondary | ICD-10-CM | POA: Insufficient documentation

## 2019-05-27 DIAGNOSIS — R109 Unspecified abdominal pain: Secondary | ICD-10-CM | POA: Diagnosis present

## 2019-05-27 DIAGNOSIS — R5383 Other fatigue: Secondary | ICD-10-CM | POA: Insufficient documentation

## 2019-05-27 DIAGNOSIS — R0602 Shortness of breath: Secondary | ICD-10-CM | POA: Insufficient documentation

## 2019-05-27 DIAGNOSIS — O99613 Diseases of the digestive system complicating pregnancy, third trimester: Principal | ICD-10-CM | POA: Insufficient documentation

## 2019-05-27 DIAGNOSIS — O09299 Supervision of pregnancy with other poor reproductive or obstetric history, unspecified trimester: Secondary | ICD-10-CM

## 2019-05-27 DIAGNOSIS — A084 Viral intestinal infection, unspecified: Secondary | ICD-10-CM | POA: Insufficient documentation

## 2019-05-27 DIAGNOSIS — O99013 Anemia complicating pregnancy, third trimester: Secondary | ICD-10-CM | POA: Insufficient documentation

## 2019-05-27 MED ORDER — LOPERAMIDE HCL 2 MG PO CAPS
2.0000 mg | ORAL_CAPSULE | ORAL | Status: DC | PRN
  Administered 2019-05-27: 2 mg via ORAL
  Filled 2019-05-27 (×2): qty 1

## 2019-05-27 NOTE — Progress Notes (Signed)
Patient ID: Pamela Cole, female   DOB: 02-21-1996, 23 y.o.   MRN: [id number]  Pamela Cole is a 23 y.o. female. She is at [redacted]w[redacted]d gestation. No LMP recorded (lmp unknown). Patient is pregnant. Estimated Date of Delivery: 07/26/19  Prenatal care site:  [facility]  Chief complaint:2 days of abd cramping and loose stools . No fever . No N/V  31+[redacted]  weeks EGA  Location: Onset/timing: Duration: Quality:  Severity: Aggravating or alleviating conditions: Associated signs/symptoms: Context:  S: Resting comfortably. no CTX, no VB.no LOF,  Active fetal movement.   Maternal Medical History:   Past Medical History:  Diagnosis Date  . Anemia   . UTI (urinary tract infection)    Hx of UTI x 1    Past Surgical History:  Procedure Laterality Date  . NO PAST SURGERIES    . TOOTH EXTRACTION Left 01/07/2019    No Known Allergies  Prior to Admission medications   Medication Sig Start Date End Date Taking? Authorizing Provider  ferrous sulfate 325 (65 FE) MG tablet Take 1 tablet (325 mg total) by mouth 2 (two) times daily with a meal. 04/24/19  Yes Lisette Grinder, CNM  Prenatal Vit-Fe Fumarate-FA (MULTIVITAMIN-PRENATAL) 27-0.8 MG TABS tablet Take 1 tablet by mouth daily at 12 noon.   Yes [provider]  ferrous sulfate 324 (65 Fe) MG TBEC Take 1 tablet (325 mg total) by mouth 3 (three) times daily. 05/16/19 06/15/19  Sciora, Real Cons, CNM     Social History: She  reports that she has never smoked. She has never used smokeless tobacco. She reports that she does not drink alcohol or use drugs.  Family History: family history includes Asthma in her brother; Breast cancer in her maternal grandmother; Hypertension in her mother.  no history of gyn cancers  Review of Systems: A full review of systems was performed and negative except as noted in the HPI.   Review of Systems: A full review of systems was performed and negative except as noted in the HPI.   Eyes:  no vision change  Ears: left ear pain  Oropharynx: no sore throat  Pulmonary . No shortness of breath , no hemoptysis Cardiovascular: no chest pain , no irregular heart beat  Gastrointestinal:no blood in stool . + diarrhea, no constipation Uro gynecologic: no dysuria , no pelvic pain Neurologic : no seizure , no migraines    Musculoskeletal: no muscular weakness  O:  BP 108/67 (BP Location: Right Arm)   Pulse 99   Temp 98.4 F (36.9 C) (Oral)   Resp 17   LMP  (LMP Unknown)  No results found for this or any previous visit (from the past 48 hour(s)).   Constitutional: NAD, AAOx3  HE/ENT: extraocular movements grossly intact, moist mucous membranes CV: RRR PULM: nl respiratory effort, CTABL     Abd: gravid, non-tender, non-distended, soft      Ext: Non-tender, Nonedmeatous   Psych: mood appropriate, speech normal Pelvic cx closed / 50%/ -2 VTX  NST: Reactive  Baseline: 150  Variability: moderate Accelerations present x >2 Decelerations absent Time 24mins    Assessment: 23 y.o. [redacted]w[redacted]d here for antenatal surveillance during pregnancy.  Principle diagnosis: Viral GE No PTL   Plan:  Labor: not present.   Fetal Wellbeing: Reassuring Cat 1 tracing.  Reactive NST    Imodium 2mg  po qid prn . K+ rich foods . PO hydrate   If symptoms persists. 48 hrs RTC ( [facility])   D/c  home stable, precautions reviewed, follow-up as scheduled.   ----- Beverly Gust MD Attending Obstetrician and Gynecologist [facility] Department of OB/GYN [facility]

## 2019-05-27 NOTE — OB Triage Note (Signed)
Pt. reports to L&D with complaints of abdominal and back pain since 0400 05/26/19. States she has intermittent abdominal tightening associated with the need to have a bowel movement; each time she has had watery diarrhea. The pain has continued into today, rated 5/10, unrelieved by Tylenol. States that her lower belly feels sore like a bruise. Denies LOF or vaginal bleeding and + for FM. Vitals signs WNL. Fetal monitors applied. Will continue to assess.

## 2019-05-27 NOTE — Telephone Encounter (Signed)
Returned patient phone call. Patient reports she has been experiencing lower abdominal pain since around 4am this morning. Patient states she feels as if "her belly is tightening and hurting really bad." Patient reports she has also had "watery diarrhea twice today and is only able to pee small amounts." Patient reports baby is moving but states "not as much." Denies burning or stinging with urination. Denies fever. Patient reports clear vaginal discharge. Patient [facility] appt moved up to tomorrow at 8:40. Consulted with CNM E. Sciora regarding above symptoms. Recommended increase fluids, Tylenol PRN for pain. If symptoms not improved and diarrhea and abdominal cramping continues to go to hospital for evaluation. Patient agreeable to above recommendations and states she "will probably just go over there (hospital)." Hal Morales, RN

## 2019-05-27 NOTE — Progress Notes (Signed)
Patient here today for new consult for anemia.  Patient is [redacted] wks pregnant, due date 07/26/19.

## 2019-05-27 NOTE — Discharge Summary (Signed)
Pamela Cole is a 23 y.o. female. She is at [redacted]w[redacted]d gestation. No LMP recorded (lmp unknown). Patient is pregnant. Estimated Date of Delivery: 07/26/19  Prenatal care site:  [facility]  Chief complaint:2 days of abd cramping and loose stools . No fever . No N/V  31+[redacted]  weeks EGA  Location: Onset/timing: Duration: Quality:  Severity: Aggravating or alleviating conditions: Associated signs/symptoms: Context:  S: Resting comfortably. noCTX, no VB.no LOF, Active fetal movement.   Maternal Medical History:       Past Medical History:  Diagnosis Date  . Anemia   . UTI (urinary tract infection)    Hx of UTI x 1         Past Surgical History:  Procedure Laterality Date  . NO PAST SURGERIES    . TOOTH EXTRACTION Left 01/07/2019    No Known Allergies         Prior to Admission medications   Medication Sig Start Date End Date Taking? Authorizing Provider  ferrous sulfate 325 (65 FE) MG tablet Take 1 tablet (325 mg total) by mouth 2 (two) times daily with a meal. 04/24/19  Yes Lisette Grinder, CNM  Prenatal Vit-Fe Fumarate-FA (MULTIVITAMIN-PRENATAL) 27-0.8 MG TABS tablet Take 1 tablet by mouth daily at 12 noon.   Yes [provider]  ferrous sulfate 324 (65 Fe) MG TBEC Take 1 tablet (325 mg total) by mouth 3 (three) times daily. 05/16/19 06/15/19  Sciora, Real Cons, CNM     Social History: She  reports that she has never smoked. She has never used smokeless tobacco. She reports that she does not drink alcohol or use drugs.  Family History: family history includes Asthma in her brother; Breast cancer in her maternal grandmother; Hypertension in her mother.  no history of gyn cancers  Review of Systems: A full review of systems was performed and negative except as noted in the HPI.   Review of Systems: A full review of systems was performed and negativeexcept as noted in the HPI.  Eyes: no vision change  Ears: left ear pain   Oropharynx: no sore throat  Pulmonary . No shortness of breath , no hemoptysis Cardiovascular: no chest pain , no irregular heart beat  Gastrointestinal:no blood in stool . + diarrhea, no constipation Uro gynecologic: no dysuria , no pelvic pain Neurologic : no seizure , no migraines  Musculoskeletal: no muscular weakness  O:  Objective   BP 108/67 (BP Location: Right Arm)   Pulse 99   Temp 98.4 F (36.9 C) (Oral)   Resp 17   LMP  (LMP Unknown)  No results found for this or any previous visit (from the past 48 hour(s)).  Constitutional: NAD, AAOx3  HE/ENT: extraocular movements grossly intact, moist mucous membranes CV: RRR PULM: nl respiratory effort, CTABL                                         Abd: gravid, non-tender, non-distended, soft                                                  Ext: Non-tender, Nonedmeatous                     Psych: mood appropriate,  speech normal Pelvic cx closed / 50%/ -2 VTX  NST: Reactive  Baseline: 150  Variability: moderate Accelerations present x >2 Decelerations absent Time    Assessment: 23 y.o. [redacted]w[redacted]d here for antenatal surveillance during pregnancy.  Principle diagnosis: Viral GE No PTL   Plan:  Labor: not present.   Fetal Wellbeing: Reassuring Cat 1 tracing.  Reactive NST    Imodium 2mg  po qid prn . K+ rich foods . PO hydrate   If symptoms persists. 48 hrs RTC ( [facility])   D/c home stable, precautions reviewed, follow-up as scheduled.   ----- MD Attending Obstetrician and Gynecologist [facility], Department of OB/GYN [facility]         Electronically signed by Francile Woolford, [facility], MD at 05/27/2019 5:39 PM

## 2019-05-27 NOTE — Telephone Encounter (Signed)
Patient has concerns she would like to speak to a nurse asap

## 2019-05-27 NOTE — Discharge Summary (Signed)
Pamela Cole is a 23 y.o. female. She is at [redacted]w[redacted]d gestation. No LMP recorded (lmp unknown). Patient is pregnant. Estimated Date of Delivery: 07/26/19  Prenatal care site:  [facility]  Chief complaint:2 days of abd cramping and loose stools . No fever . No N/V  31+[redacted]  weeks EGA  Location: Onset/timing: Duration: Quality:  Severity: Aggravating or alleviating conditions: Associated signs/symptoms: Context:  S: Resting comfortably. noCTX, no VB.no LOF, Active fetal movement.   Maternal Medical History:       Past Medical History:  Diagnosis Date  . Anemia   . UTI (urinary tract infection)    Hx of UTI x 1         Past Surgical History:  Procedure Laterality Date  . NO PAST SURGERIES    . TOOTH EXTRACTION Left 01/07/2019    No Known Allergies         Prior to Admission medications   Medication Sig Start Date End Date Taking? Authorizing Provider  ferrous sulfate 325 (65 FE) MG tablet Take 1 tablet (325 mg total) by mouth 2 (two) times daily with a meal. 04/24/19  Yes Lisette Grinder, CNM  Prenatal Vit-Fe Fumarate-FA (MULTIVITAMIN-PRENATAL) 27-0.8 MG TABS tablet Take 1 tablet by mouth daily at 12 noon.   Yes [provider]  ferrous sulfate 324 (65 Fe) MG TBEC Take 1 tablet (325 mg total) by mouth 3 (three) times daily. 05/16/19 06/15/19  Sciora, Real Cons, CNM     Social History: She  reports that she has never smoked. She has never used smokeless tobacco. She reports that she does not drink alcohol or use drugs.  Family History: family history includes Asthma in her brother; Breast cancer in her maternal grandmother; Hypertension in her mother.  no history of gyn cancers  Review of Systems: A full review of systems was performed and negative except as noted in the HPI.   Review of Systems: A full review of systems was performed and negativeexcept as noted in the HPI.  Eyes: no vision change  Ears: left ear pain   Oropharynx: no sore throat  Pulmonary . No shortness of breath , no hemoptysis Cardiovascular: no chest pain , no irregular heart beat  Gastrointestinal:no blood in stool . + diarrhea, no constipation Uro gynecologic: no dysuria , no pelvic pain Neurologic : no seizure , no migraines  Musculoskeletal: no muscular weakness  O:  Objective   BP 108/67 (BP Location: Right Arm)   Pulse 99   Temp 98.4 F (36.9 C) (Oral)   Resp 17   LMP  (LMP Unknown)  No results found for this or any previous visit (from the past 48 hour(s)).  Constitutional: NAD, AAOx3  HE/ENT: extraocular movements grossly intact, moist mucous membranes CV: RRR PULM: nl respiratory effort, CTABL                                         Abd: gravid, non-tender, non-distended, soft                                                  Ext: Non-tender, Nonedmeatous                     Psych: mood appropriate,  speech normal Pelvic cx closed / 50%/ -2 VTX  NST: Reactive  Baseline: 150  Variability: moderate Accelerations present x >2 Decelerations absent Time 20mins    Assessment: 23 y.o. [redacted]w[redacted]d here for antenatal surveillance during pregnancy.  Principle diagnosis: Viral GE No PTL   Plan:  Labor: not present.   Fetal Wellbeing: Reassuring Cat 1 tracing.  Reactive NST    Imodium 2mg po qid prn . K+ rich foods . PO hydrate   If symptoms persists. 48 hrs RTC ( [facility])   D/c home stable, precautions reviewed, follow-up as scheduled.   ----- T Anyeli Hockenbury MD Attending Obstetrician and Gynecologist [facility], Department of OB/GYN [facility]         Electronically signed by Shariq Puig J, MD at 05/27/2019 5:39 PM  

## 2019-05-28 ENCOUNTER — Other Ambulatory Visit: Payer: Self-pay | Admitting: Oncology

## 2019-05-28 ENCOUNTER — Inpatient Hospital Stay: Payer: Self-pay

## 2019-05-28 ENCOUNTER — Other Ambulatory Visit: Payer: Self-pay

## 2019-05-28 ENCOUNTER — Ambulatory Visit: Payer: Self-pay | Admitting: Advanced Practice Midwife

## 2019-05-28 VITALS — BP 95/67 | Temp 98.0°F | Wt 154.8 lb

## 2019-05-28 VITALS — BP 100/64 | HR 106 | Temp 98.6°F | Resp 18

## 2019-05-28 DIAGNOSIS — O99013 Anemia complicating pregnancy, third trimester: Secondary | ICD-10-CM

## 2019-05-28 DIAGNOSIS — Z9189 Other specified personal risk factors, not elsewhere classified: Secondary | ICD-10-CM

## 2019-05-28 DIAGNOSIS — Z3480 Encounter for supervision of other normal pregnancy, unspecified trimester: Secondary | ICD-10-CM

## 2019-05-28 MED ORDER — SODIUM CHLORIDE 0.9 % IV SOLN
510.0000 mg | Freq: Once | INTRAVENOUS | Status: AC
  Administered 2019-05-28: 14:00:00 510 mg via INTRAVENOUS
  Filled 2019-05-28: qty 17

## 2019-05-28 MED ORDER — SODIUM CHLORIDE 0.9 % IV SOLN
Freq: Once | INTRAVENOUS | Status: AC
  Administered 2019-05-28: 14:00:00 via INTRAVENOUS
  Filled 2019-05-28: qty 250

## 2019-05-28 NOTE — Progress Notes (Signed)
   PRENATAL VISIT NOTE  Subjective:  Pamela Cole is a 23 y.o. G3P2002 at [redacted]w[redacted]d being seen today for ongoing prenatal care.  She is currently monitored for the following issues for this low-risk pregnancy and has Anemia affecting pregnancy in third trimester; Abnormal cervical cytology; Supervision of other normal pregnancy, antepartum; IUGR (intrauterine growth retardation) in prior pregnancy, pregnant; Vaginal bleeding in pregnancy, second trimester; At risk for abnormal blood glucose level; and Abdominal cramping affecting pregnancy on their problem list.  Patient reports no complaints.  Contractions: Not present. Vag. Bleeding: None.  Movement: Present. Denies leaking of fluid/ROM.   The following portions of the patient's history were reviewed and updated as appropriate: allergies, current medications, past family history, past medical history, past social history, past surgical history and problem list. Problem list updated.  Objective:   Vitals:   05/28/19 0837  BP: 95/67  Temp: 98 F (36.7 C)  Weight: 154 lb 12.8 oz (70.2 kg)    Fetal Status: Fetal Heart Rate (bpm): 150 Fundal Height: 30 cm Movement: Present  Presentation: Complete Breech  General:  Alert, oriented and cooperative. Patient is in no acute distress.  Skin: Skin is warm and dry. No rash noted.   Cardiovascular: Normal heart rate noted  Respiratory: Normal respiratory effort, no problems with respiration noted  Abdomen: Soft, gravid, appropriate for gestational age.  Pain/Pressure: Present     Pelvic: Cervical exam deferred        Extremities: Normal range of motion.  Edema: None  Mental Status: Normal mood and affect. Normal behavior. Normal judgment and thought content.   Assessment and Plan:  Pregnancy: G3P2002 at [redacted]w[redacted]d  1. Supervision of other normal pregnancy, antepartum Reviewed ER notes from yesterday when c/o loose stools and cramping x 2 days; states was given a pill and no more diarrhea  since.  Just fatigue  2. Anemia affecting pregnancy in third trimester First appt yesterday with Dr. Grayland Ormond; has appt with him today for transfusion.  Appt 06/03/19 and 07/15/19 Taking FeSo4 TID with oj  3. At risk for abnormal blood glucose level 3 hr GTT wnl on 10//13/20   Preterm labor symptoms and general obstetric precautions including but not limited to vaginal bleeding, contractions, leaking of fluid and fetal movement were reviewed in detail with the patient. Please refer to After Visit Summary for other counseling recommendations.  Return in about 2 weeks (around 06/11/2019) for routine PNC.  Future Appointments  Date Time Provider [place]  05/28/2019  1:30 PM [facility] INFUSION CHAIR 17 [facility] None  06/03/2019  2:00 PM [facility] CHAIR 1 [facility] None  07/15/2019  1:30 PM [facility] LAB [facility] None  07/15/2019  1:45 PM Grayland Ormond, Kathlene November, MD [facility] None  07/15/2019  2:00 PM [place] INFUSION CHAIR 4 [facility] None    Herbie Saxon, CNM

## 2019-05-28 NOTE — Progress Notes (Addendum)
Patient here for MH RV at 31 4/7. Taking iron 3x/day with juice. Went to [facility] ED with cramping and diarrhea. Patient states they gave her a pill for diarrrhea and she hasn't had any diarrhea today. Had hematology appointment yesterday and is scheduled to go back today, and then on 06/03/19 and 07/15/19. Kick counts reviewed and cards given. Patient c/o allergies, and counseled to check "safe medications in pregnancy sheet" if she is interested in allergy relief. Patient states she still has new OB bag with papers in it.Jenetta Downer, RN

## 2019-05-30 ENCOUNTER — Ambulatory Visit: Payer: Self-pay

## 2019-06-03 ENCOUNTER — Other Ambulatory Visit: Payer: Self-pay

## 2019-06-03 ENCOUNTER — Inpatient Hospital Stay: Payer: Self-pay

## 2019-06-03 VITALS — BP 92/62 | HR 89 | Temp 98.0°F | Resp 18

## 2019-06-03 DIAGNOSIS — O99013 Anemia complicating pregnancy, third trimester: Secondary | ICD-10-CM

## 2019-06-03 MED ORDER — SODIUM CHLORIDE 0.9 % IV SOLN
510.0000 mg | Freq: Once | INTRAVENOUS | Status: AC
  Administered 2019-06-03: 14:00:00 510 mg via INTRAVENOUS
  Filled 2019-06-03: qty 17

## 2019-06-03 MED ORDER — SODIUM CHLORIDE 0.9 % IV SOLN
Freq: Once | INTRAVENOUS | Status: AC
  Administered 2019-06-03: 14:00:00 via INTRAVENOUS
  Filled 2019-06-03: qty 250

## 2019-06-05 ENCOUNTER — Encounter: Payer: Self-pay | Admitting: Pharmacy Technician

## 2019-06-05 NOTE — Progress Notes (Signed)
Patient has been approved for drug assistance by [employer] for Feraheme. The enrollment period is from 05/28/19-05/26/20 based on self pay. First DOS covered is 05/28/19.

## 2019-06-11 ENCOUNTER — Emergency Department: Payer: Self-pay

## 2019-06-11 ENCOUNTER — Emergency Department
Admission: EM | Admit: 2019-06-11 | Discharge: 2019-06-11 | Disposition: A | Discharge status: Home / Self Care | Payer: Self-pay | Attending: Emergency Medicine | Admitting: Emergency Medicine

## 2019-06-11 ENCOUNTER — Other Ambulatory Visit: Payer: Self-pay

## 2019-06-11 ENCOUNTER — Ambulatory Visit: Payer: Medicaid Other | Admitting: Advanced Practice Midwife

## 2019-06-11 DIAGNOSIS — O09299 Supervision of pregnancy with other poor reproductive or obstetric history, unspecified trimester: Secondary | ICD-10-CM

## 2019-06-11 DIAGNOSIS — Z3A33 33 weeks gestation of pregnancy: Secondary | ICD-10-CM | POA: Insufficient documentation

## 2019-06-11 DIAGNOSIS — R1010 Upper abdominal pain, unspecified: Secondary | ICD-10-CM | POA: Insufficient documentation

## 2019-06-11 DIAGNOSIS — M549 Dorsalgia, unspecified: Secondary | ICD-10-CM

## 2019-06-11 DIAGNOSIS — O99013 Anemia complicating pregnancy, third trimester: Secondary | ICD-10-CM

## 2019-06-11 DIAGNOSIS — Z3480 Encounter for supervision of other normal pregnancy, unspecified trimester: Secondary | ICD-10-CM

## 2019-06-11 DIAGNOSIS — O26893 Other specified pregnancy related conditions, third trimester: Secondary | ICD-10-CM | POA: Insufficient documentation

## 2019-06-11 LAB — URINALYSIS, COMPLETE (UACMP) WITH MICROSCOPIC
Bilirubin Urine: NEGATIVE
Glucose, UA: NEGATIVE mg/dL
Hgb urine dipstick: NEGATIVE
Ketones, ur: NEGATIVE mg/dL
Nitrite: NEGATIVE
Protein, ur: NEGATIVE mg/dL
Specific Gravity, Urine: 1.019 (ref 1.005–1.030)
pH: 6 (ref 5.0–8.0)

## 2019-06-11 LAB — HEPATIC FUNCTION PANEL
ALT: 15 U/L (ref 0–44)
AST: 36 U/L (ref 15–41)
Albumin: 3.2 g/dL — ABNORMAL LOW (ref 3.5–5.0)
Alkaline Phosphatase: 105 U/L (ref 38–126)
Bilirubin, Direct: <0.1 mg/dL (ref 0.0–0.2)
Total Bilirubin: 0.6 mg/dL (ref 0.3–1.2)
Total Protein: 6.7 g/dL (ref 6.5–8.1)

## 2019-06-11 LAB — CBC
HCT: 32.8 % — ABNORMAL LOW (ref 36.0–46.0)
Hemoglobin: 9.6 g/dL — ABNORMAL LOW (ref 12.0–15.0)
MCH: 21.2 pg — ABNORMAL LOW (ref 26.0–34.0)
MCHC: 29.3 g/dL — ABNORMAL LOW (ref 30.0–36.0)
MCV: 72.6 fL — ABNORMAL LOW (ref 80.0–100.0)
Platelets: 248 10*3/uL (ref 150–400)
RBC: 4.52 MIL/uL (ref 3.87–5.11)
RDW: 26.7 % — ABNORMAL HIGH (ref 11.5–15.5)
WBC: 9.9 10*3/uL (ref 4.0–10.5)
nRBC: 0 % (ref 0.0–0.2)

## 2019-06-11 LAB — BASIC METABOLIC PANEL
Anion gap: 12 (ref 5–15)
BUN: <5 mg/dL — ABNORMAL LOW (ref 6–20)
CO2: 19 mmol/L — ABNORMAL LOW (ref 22–32)
Calcium: 8.5 mg/dL — ABNORMAL LOW (ref 8.9–10.3)
Chloride: 104 mmol/L (ref 98–111)
Creatinine, Ser: 0.38 mg/dL — ABNORMAL LOW (ref 0.44–1.00)
GFR calc Af Amer: >60 mL/min (ref >60)
GFR calc non Af Amer: >60 mL/min (ref >60)
Glucose, Bld: 103 mg/dL — ABNORMAL HIGH (ref 70–99)
Potassium: 3.3 mmol/L — ABNORMAL LOW (ref 3.5–5.1)
Sodium: 135 mmol/L (ref 135–145)

## 2019-06-11 LAB — HEMOGLOBIN, FINGERSTICK: Hemoglobin: 10.1 g/dL — ABNORMAL LOW (ref 11.1–15.9)

## 2019-06-11 LAB — POCT PREGNANCY, URINE: Preg Test, Ur: POSITIVE — AB

## 2019-06-11 LAB — LIPASE, BLOOD: Lipase: 16 U/L (ref 11–51)

## 2019-06-11 NOTE — Progress Notes (Signed)
   PRENATAL VISIT NOTE  Subjective:  Pamela Cole is a 23 y.o. G3P2002 at [redacted]w[redacted]d being seen today for ongoing prenatal care.  She is currently monitored for the following issues for this high-risk pregnancy and has Anemia affecting pregnancy in third trimester; Abnormal cervical cytology; Supervision of other normal pregnancy, antepartum; IUGR (intrauterine growth retardation) in prior pregnancy, pregnant; Vaginal bleeding in pregnancy, second trimester; At risk for abnormal blood glucose level; and Abdominal cramping affecting pregnancy on their problem list.  Patient reports backache.  Contractions: Not present. Vag. Bleeding: None.  Movement: Present. Denies leaking of fluid/ROM.   The following portions of the patient's history were reviewed and updated as appropriate: allergies, current medications, past family history, past medical history, past social history, past surgical history and problem list. Problem list updated.  Objective:   Vitals:   06/11/19 0910  BP: 103/61  Temp: (!) 97.5 F (36.4 C)  Weight: 154 lb 9.6 oz (70.1 kg)    Fetal Status: Fetal Heart Rate (bpm): 160 Fundal Height: 33 cm Movement: Present     General:  Alert, oriented and cooperative. Patient is in no acute distress.  Skin: Skin is warm and dry. No rash noted.   Cardiovascular: Normal heart rate noted  Respiratory: Normal respiratory effort, no problems with respiration noted  Abdomen: Soft, gravid, appropriate for gestational age.  Pain/Pressure: Present     Pelvic: Cervical exam deferred        Extremities: Normal range of motion.  Edema: None  Mental Status: Normal mood and affect. Normal behavior. Normal judgment and thought content.   Assessment and Plan:  Pregnancy: G3P2002 at [redacted]w[redacted]d  1. IUGR (intrauterine growth retardation) in prior pregnancy, pregnant Measuring well today  2. Supervision of other normal pregnancy, antepartum C/o mid right flank pain intermittently x 2 weeks  sometimes relieved with heat, Tylenol.  Possible gallbladder? May need u/s. To avoid greasy, fatty, fried foods and to ER if worsens  3. Anemia affecting pregnancy in third trimester Taking FeSo4 TID.  Has seen Dr. Grayland Ormond 10/20, 05/28/19, 10/27, and will go 07/15/19 Ice pica in past--not currently - Hemoglobin, venipuncture   Preterm labor symptoms and general obstetric precautions including but not limited to vaginal bleeding, contractions, leaking of fluid and fetal movement were reviewed in detail with the patient. Please refer to After Visit Summary for other counseling recommendations.  No follow-ups on file.  Future Appointments  Date Time Provider [place]  07/15/2019  1:30 PM [facility] None  07/15/2019  1:45 PM Lloyd Huger, MD [facility] None  07/15/2019  2:00 PM [place] INFUSION CHAIR 4 [facility] None    Herbie Saxon, CNM

## 2019-06-11 NOTE — ED Triage Notes (Signed)
Pt c/o upper flank for the past 2 weeks and OB is concerned it is kidney or gall bladder, denies N/V/D.Marland Kitchen states it is a constant pain. Pt is [redacted] weeks pregnant.

## 2019-06-11 NOTE — Progress Notes (Signed)
Negative responses to Covid-19 screenig questions for client and her mother who is with her at appt today. Aware of 07/15/2019 appt with Dr. Grayland Ormond and has entered apt into cell phone calendar.Taking PNV QD and iron tablet TID. Rich Number, RN

## 2019-06-11 NOTE — ED Provider Notes (Signed)
[facility] Emergency Department Provider Note  Time seen: 2:18 PM  I have reviewed the triage vital signs and the nursing notes.   HISTORY  Chief Complaint Flank Pain   HPI Pamela Cole is a 23 y.o. female [redacted] weeks pregnant who presents to the emergency department for upper abdominal discomfort.  Patient states over the past several months she has been experiencing intermittent upper abdominal pain.  Denies any vomiting or diarrhea.  Patient is followed up by the health department for her OB care.  This is the patient's third pregnancy, G3 P2.  Patient denies any abdominal pain currently, states the pain comes and goes.  Possibly associated with eating.  Patient has been told by her OB in the past that it could likely be her gallbladder.  No fever or cough.  No vaginal bleeding.  No lower abdominal pain.   Past Medical History:  Diagnosis Date  . Anemia   . UTI (urinary tract infection)    Hx of UTI x 1    Patient Active Problem List   Diagnosis Date Noted  . Abdominal cramping affecting pregnancy 05/27/2019  . At risk for abnormal blood glucose level 05/19/2019  . Vaginal bleeding in pregnancy, second trimester 04/24/2019  . IUGR (intrauterine growth retardation) in prior pregnancy, pregnant 02/18/2019  . Abnormal cervical cytology 01/30/2019  . Supervision of other normal pregnancy, antepartum 01/30/2019  . Anemia affecting pregnancy in third trimester 04/17/2016    Past Surgical History:  Procedure Laterality Date  . NO PAST SURGERIES    . TOOTH EXTRACTION Left 01/07/2019    Prior to Admission medications   Medication Sig Start Date End Date Taking? Authorizing Provider  ferrous sulfate 324 (65 Fe) MG TBEC Take 1 tablet (325 mg total) by mouth 3 (three) times daily. 05/16/19 06/15/19  Sciora, Real Cons, CNM  Prenatal Vit-Fe Fumarate-FA (MULTIVITAMIN-PRENATAL) 27-0.8 MG TABS tablet Take 1 tablet by mouth daily at 12 noon.    [provider]    No Known Allergies  Family History  Problem Relation Age of Onset  . Hypertension Mother   . Asthma Brother   . Breast cancer Maternal Grandmother     Social History Social History   Tobacco Use  . Smoking status: Never Smoker  . Smokeless tobacco: Never Used  Substance Use Topics  . Alcohol use: No  . Drug use: No    Review of Systems Constitutional: Negative for fever. Cardiovascular: Negative for chest pain. Respiratory: Negative for shortness of breath. Gastrointestinal: Intermittent upper abdominal discomfort.  Negative for vomiting or diarrhea Genitourinary: Negative for urinary compaints.  Negative for vaginal bleeding. Musculoskeletal: Negative for musculoskeletal complaint Neurological: Negative for headache All other ROS negative  ____________________________________________   PHYSICAL EXAM:  VITAL SIGNS: ED Triage Vitals  Enc Vitals Group     BP 06/11/19 1158 (!) 105/54     Pulse Rate 06/11/19 1158 97     Resp 06/11/19 1158 18     Temp 06/11/19 1158 98 F (36.7 C)     Temp Source 06/11/19 1158 Oral     SpO2 06/11/19 1158 99 %     Weight 06/11/19 1155 145 lb (65.8 kg)     Height 06/11/19 1155 5' (1.524 m)     Head Circumference --      Peak Flow --      Pain Score 06/11/19 1155 8     Pain Loc --      Pain Edu? --  Excl. in GC? --    Constitutional: Alert and oriented. Well appearing and in no distress. Eyes: Normal exam ENT      Head: Normocephalic and atraumatic.      Mouth/Throat: Mucous membranes are moist. Cardiovascular: Normal rate, regular rhythm.  Respiratory: Normal respiratory effort without tachypnea nor retractions. Breath sounds are clear  Gastrointestinal: Gravid abdomen, nontender. Musculoskeletal: Nontender with normal range of motion in all extremities.  Neurologic:  Normal speech and language. No gross focal neurologic deficits Skin:  Skin is warm, dry and intact.  Psychiatric: Mood and affect are  normal.  ____________________________________________    RADIOLOGY  Ultrasound shows 2 small echogenic foci in the gallbladder wall possibly polyp or stone.  No gallbladder wall thickening.  No pericholecystic fluid.  Negative Murphy sign.  ____________________________________________   INITIAL IMPRESSION / ASSESSMENT AND PLAN / ED COURSE  Pertinent labs & imaging results that were available during my care of the patient were reviewed by me and considered in my medical decision making (see chart for details).   Patient presents to the emergency department for intermittent upper abdominal pain.  Patient is [redacted] weeks pregnant.  Patient has a nontender/benign abdominal exam.  Lab work is largely within normal limits I have added on a hepatic function panel and lipase as a precaution.  Patient's ultrasound shows 2 small stones versus polyps, but no signs of cholecystitis.  We will send a urine sample, lipase and hepatic function panel.  Overall the patient appears very well with a reassuring physical exam as well as reassuring vitals.  Lab work largely within normal limits.  Urinalysis is nonrevealing.  Hepatic function panel and lipase are normal.  We will discharge the patient home with PCP/OB follow-up.  Pamela Cole was evaluated in Emergency Department on 06/11/2019 for the symptoms described in the history of present illness. She was evaluated in the context of the global COVID-19 pandemic, which necessitated consideration that the patient might be at risk for infection with the SARS-CoV-2 virus that causes COVID-19. Institutional protocols and algorithms that pertain to the evaluation of patients at risk for COVID-19 are in a state of rapid change based on information released by regulatory bodies including the CDC and federal and state organizations. These policies and algorithms were followed during the patient's care in the  ED.  ____________________________________________   FINAL CLINICAL IMPRESSION(S) / ED DIAGNOSES  Upper abdominal pain   [name], MD 06/11/19 1421

## 2019-06-15 ENCOUNTER — Observation Stay
Admission: EM | Admit: 2019-06-15 | Discharge: 2019-06-15 | Disposition: A | Discharge status: Home / Self Care | Payer: Self-pay | Attending: Obstetrics & Gynecology | Admitting: Obstetrics & Gynecology

## 2019-06-15 ENCOUNTER — Other Ambulatory Visit: Payer: Self-pay

## 2019-06-15 ENCOUNTER — Encounter: Payer: Self-pay | Admitting: *Deleted

## 2019-06-15 DIAGNOSIS — Z3480 Encounter for supervision of other normal pregnancy, unspecified trimester: Secondary | ICD-10-CM

## 2019-06-15 DIAGNOSIS — N898 Other specified noninflammatory disorders of vagina: Secondary | ICD-10-CM | POA: Insufficient documentation

## 2019-06-15 DIAGNOSIS — Z79899 Other long term (current) drug therapy: Secondary | ICD-10-CM | POA: Insufficient documentation

## 2019-06-15 DIAGNOSIS — O09299 Supervision of pregnancy with other poor reproductive or obstetric history, unspecified trimester: Secondary | ICD-10-CM

## 2019-06-15 DIAGNOSIS — Z3A34 34 weeks gestation of pregnancy: Secondary | ICD-10-CM | POA: Insufficient documentation

## 2019-06-15 DIAGNOSIS — O26893 Other specified pregnancy related conditions, third trimester: Principal | ICD-10-CM | POA: Insufficient documentation

## 2019-06-15 HISTORY — DX: Calculus of gallbladder without cholecystitis without obstruction: K80.20

## 2019-06-15 LAB — CBC
HCT: 31.7 % — ABNORMAL LOW (ref 36.0–46.0)
Hemoglobin: 9.7 g/dL — ABNORMAL LOW (ref 12.0–15.0)
MCH: 21.4 pg — ABNORMAL LOW (ref 26.0–34.0)
MCHC: 30.6 g/dL (ref 30.0–36.0)
MCV: 70 fL — ABNORMAL LOW (ref 80.0–100.0)
Platelets: 241 10*3/uL (ref 150–400)
RBC: 4.53 MIL/uL (ref 3.87–5.11)
RDW: 27.5 % — ABNORMAL HIGH (ref 11.5–15.5)
WBC: 10.1 10*3/uL (ref 4.0–10.5)
nRBC: 0 % (ref 0.0–0.2)

## 2019-06-15 LAB — CHLAMYDIA/NGC RT PCR (ARMC ONLY)
Chlamydia Tr: NOT DETECTED
N gonorrhoeae: NOT DETECTED

## 2019-06-15 LAB — URINALYSIS, ROUTINE W REFLEX MICROSCOPIC
Bacteria, UA: NONE SEEN
Bilirubin Urine: NEGATIVE
Glucose, UA: NEGATIVE mg/dL
Hgb urine dipstick: NEGATIVE
Ketones, ur: NEGATIVE mg/dL
Nitrite: NEGATIVE
Protein, ur: 30 mg/dL — AB
Specific Gravity, Urine: 1.019 (ref 1.005–1.030)
WBC, UA: >50 WBC/hpf — ABNORMAL HIGH (ref 0–5)
pH: 7 (ref 5.0–8.0)

## 2019-06-15 LAB — WET PREP, GENITAL
Clue Cells Wet Prep HPF POC: NONE SEEN
Sperm: NONE SEEN
Trich, Wet Prep: NONE SEEN
Yeast Wet Prep HPF POC: NONE SEEN

## 2019-06-15 NOTE — ED Notes (Signed)
Spoke with Jarrett Soho in L & D, they will see pt upstairs.

## 2019-06-15 NOTE — Progress Notes (Signed)
Spoke to Dr. Leonides Schanz on the phone. Provider reviewed strip and labs and gave verbal order for discharge home. Provider stated she wants patient to go to follow up appointment scheduled for 06/25/2019 with the [facility]. RN advised pt to make sooner appointment if necessary. Pt given labor precaution and pt verbalizes understanding. AVS given to pt, Pt discharged home.

## 2019-06-16 NOTE — Discharge Summary (Signed)
Pamela Cole is a 23 y.o. female. She is at [redacted]w[redacted]d gestation. No LMP recorded (lmp unknown). Patient is pregnant. Estimated Date of Delivery: 07/26/19  Prenatal care site:  [facility]  Chief complaint: pelvic pressure and vaginal discharge  Location: vagina, pelvis Onset/timing: last few days Duration: few days Quality:  Clear white discharge, and pressure Severity: mild to moderate Aggravating or alleviating conditions: nothing makes better or worse.   Associated signs/symptoms: no fever, chills, +nausea/vomiting/diarrhea - just recently dx'd with gallstones.  +epigastric pain, no vaginal bleeding.  S: Resting comfortably. no CTX, no VB .no LOF,  Active fetal movement.   Maternal Medical History:   Past Medical History:  Diagnosis Date  . Anemia   . Cholelithiasis   . UTI (urinary tract infection)    Hx of UTI x 1    Past Surgical History:  Procedure Laterality Date  . NO PAST SURGERIES    . TOOTH EXTRACTION Left 01/07/2019    No Known Allergies  Prior to Admission medications   Medication Sig Start Date End Date Taking? Authorizing Provider  acetaminophen (TYLENOL) 500 MG tablet Take 1,000 mg by mouth every 6 (six) hours as needed.   Yes [provider]  ferrous sulfate 324 (65 Fe) MG TBEC Take 1 tablet (325 mg total) by mouth 3 (three) times daily. 05/16/19 06/15/19 Yes Sciora, Real Cons, CNM  Prenatal Vit-Fe Fumarate-FA (MULTIVITAMIN-PRENATAL) 27-0.8 MG TABS tablet Take 1 tablet by mouth daily at 12 noon.   Yes [provider]     Social History: She  reports that she has never smoked. She has never used smokeless tobacco. She reports that she does not drink alcohol or use drugs.  Family History: family history includes Asthma in her brother; Breast cancer in her maternal grandmother; Hypertension in her mother.   Review of Systems: A full review of systems was performed and negative except as noted in the HPI.     O:  BP (!) 111/49 (BP  Location: Right Arm)   Pulse (!) 101   Temp 98.4 F (36.9 C) (Oral)   Resp 16   Ht 4\' 11"  (1.499 m)   Wt 65.8 kg   LMP  (LMP Unknown)   SpO2 99%   BMI 29.29 kg/m  Results for orders placed or performed during the hospital encounter of 06/15/19 (from the past 48 hour(s))  Chlamydia/NGC rt PCR ([place] only)   Collection Time: 06/15/19  7:17 PM   Specimen: Urine, Clean Catch  Result Value Ref Range   Specimen source GC/Chlam URINE, RANDOM    Chlamydia Tr NOT DETECTED NOT DETECTED   N gonorrhoeae NOT DETECTED NOT DETECTED  Wet prep, genital   Collection Time: 06/15/19  7:17 PM   Specimen: Urine, Clean Catch  Result Value Ref Range   Yeast Wet Prep HPF POC NONE SEEN NONE SEEN   Trich, Wet Prep NONE SEEN NONE SEEN   Clue Cells Wet Prep HPF POC NONE SEEN NONE SEEN   WBC, Wet Prep HPF POC MODERATE (A) NONE SEEN   Sperm NONE SEEN   Urinalysis, Routine w reflex microscopic   Collection Time: 06/15/19  7:17 PM  Result Value Ref Range   Color, Urine YELLOW (A) YELLOW   APPearance CLOUDY (A) CLEAR   Specific Gravity, Urine 1.019 1.005 - 1.030   pH 7.0 5.0 - 8.0   Glucose, UA NEGATIVE NEGATIVE mg/dL   Hgb urine dipstick NEGATIVE NEGATIVE   Bilirubin Urine NEGATIVE NEGATIVE   Ketones, ur  NEGATIVE NEGATIVE mg/dL   Protein, ur 30 (A) NEGATIVE mg/dL   Nitrite NEGATIVE NEGATIVE   Leukocytes,Ua LARGE (A) NEGATIVE   RBC / HPF 0-5 0 - 5 RBC/hpf   WBC, UA >50 (H) 0 - 5 WBC/hpf   Bacteria, UA NONE SEEN NONE SEEN   Squamous Epithelial / LPF 21-50 0 - 5   Mucus PRESENT   CBC   Collection Time: 06/15/19  7:28 PM  Result Value Ref Range   WBC 10.1 4.0 - 10.5 K/uL   RBC 4.53 3.87 - 5.11 MIL/uL   Hemoglobin 9.7 (L) 12.0 - 15.0 g/dL   HCT 09.2 (L) 33.0 - 07.6 %   MCV 70.0 (L) 80.0 - 100.0 fL   MCH 21.4 (L) 26.0 - 34.0 pg   MCHC 30.6 30.0 - 36.0 g/dL   RDW 22.6 (H) 33.3 - 54.5 %   Platelets 241 150 - 400 K/uL   nRBC 0.0 0.0 - 0.2 %     Constitutional: NAD, AAOx3  HE/ENT: extraocular  movements grossly intact, moist mucous membranes CV: RRR PULM: nl respiratory effort, CTABL     Abd: gravid, non-tender, non-distended, soft      Ext: Non-tender, Nonedmeatous   Psych: mood appropriate, speech normal Pelvic + white discharge, swabs collected by RN  NST:  Baseline: 155 Variability: moderate Accelerations present x >2 Decelerations absent Time  Assessment: 23 y.o. [redacted]w[redacted]d here for pelvic pressure and vaginal discharge during pregnancy.  Principle diagnosis: pelvic pressure  Plan:  Labor: not present.   Fetal Wellbeing: Reassuring Cat 1 tracing.  Reactive NST   Anemia - followed by Heme s/p IV iron infusions  Pelvic pressure - urinalysis suggests UTI, although had had recent GI upset and gallstones, could be GI etiology.    D/c home stable, precautions reviewed, follow-up as scheduled.   ----- Ranae Plumber, MD Attending Obstetrician and Gynecologist [facility], Department of OB/GYN [facility]

## 2019-06-19 NOTE — Addendum Note (Signed)
Addended by: Cletis Media on: 06/19/2019 09:52 AM   Modules accepted: Orders

## 2019-06-25 ENCOUNTER — Other Ambulatory Visit: Payer: Self-pay

## 2019-06-25 ENCOUNTER — Other Ambulatory Visit: Payer: Self-pay | Admitting: Family Medicine

## 2019-06-25 ENCOUNTER — Ambulatory Visit (LOCAL_COMMUNITY_HEALTH_CENTER): Payer: Medicaid Other

## 2019-06-25 ENCOUNTER — Encounter: Payer: Self-pay | Admitting: Advanced Practice Midwife

## 2019-06-25 VITALS — BP 93/62 | HR 78 | Temp 97.5°F | Wt 156.4 lb

## 2019-06-25 DIAGNOSIS — Z3480 Encounter for supervision of other normal pregnancy, unspecified trimester: Secondary | ICD-10-CM

## 2019-06-25 DIAGNOSIS — Z8742 Personal history of other diseases of the female genital tract: Secondary | ICD-10-CM

## 2019-06-25 DIAGNOSIS — R112 Nausea with vomiting, unspecified: Secondary | ICD-10-CM

## 2019-06-25 DIAGNOSIS — O09299 Supervision of pregnancy with other poor reproductive or obstetric history, unspecified trimester: Secondary | ICD-10-CM

## 2019-06-25 DIAGNOSIS — A048 Other specified bacterial intestinal infections: Secondary | ICD-10-CM | POA: Insufficient documentation

## 2019-06-25 LAB — URINALYSIS
Bilirubin, UA: NEGATIVE
Glucose, UA: NEGATIVE
Nitrite, UA: NEGATIVE
Protein,UA: NEGATIVE
Specific Gravity, UA: 1.02 (ref 1.005–1.030)
Urobilinogen, Ur: 1 mg/dL (ref 0.2–1.0)
pH, UA: 7 (ref 5.0–7.5)

## 2019-06-25 MED ORDER — CEFTRIAXONE SODIUM 1 G IJ SOLR
1.0000 g | Freq: Once | INTRAMUSCULAR | Status: AC
  Administered 2019-06-25: 1 g via INTRAMUSCULAR

## 2019-06-25 NOTE — Progress Notes (Signed)
PRENATAL VISIT NOTE  Subjective:  Pamela Cole is a 23 y.o. G3P2002 at [redacted]w[redacted]d being seen today for ongoing prenatal care.  She is currently monitored for the following issues for this low-risk pregnancy and has Anemia affecting pregnancy in third trimester; Abnormal cervical cytology; Supervision of other normal pregnancy, antepartum; IUGR (intrauterine growth retardation) in prior pregnancy, pregnant; Vaginal bleeding in pregnancy, second trimester; At risk for abnormal blood glucose level; Abdominal cramping affecting pregnancy; Indication for care in labor and delivery, antepartum; and (Suspected) Pyelonephritis affecting pregnancy in third trimester on their problem list.  Patient reports nausea and vomiting several times per day x1 week.. This comes a few minutes after eating and she will vomit up most of what she eats. Denies blood in vomit. Also endorses urinary frequency and hesitancy as well as back and body aches. Denies blood in urine. She also continues to have epigastric pain (for which she was seen in the ER on 06/11/19 and diagnosed with gallstones), though this has improved since that ER visit. States abdominal pain she has feels like "upset stomach" and irritability, occasional bloating/gassiness but denies contractions. Denies dizziness, shortness of breath, diarrhea, swollen extremities, or a lack of appetite.   Contractions: Not present. Vag. Bleeding: None.  Movement: Present.  Denies leaking of fluid/ROM.   The following portions of the patient's history were reviewed and updated as appropriate: allergies, current medications, past family history, past medical history, past social history, past surgical history and problem list. Problem list updated.  Objective:   Vitals:   06/25/19 0849  BP: 93/62  Pulse: 78  Temp: (!) 97.5 F (36.4 C)  Weight: 156 lb 6.4 oz (70.9 kg)    Fetal Status: Fetal Heart Rate (bpm): 150 Fundal Height: 34 cm Movement: Present   Presentation: Vertex  General:  Alert, well appearing, oriented and cooperative. Patient is in no acute distress and easily converses.  Skin: Skin is warm and dry. No rash noted.   Cardiovascular: Normal heart rate and rhythm, no murmurs  Respiratory: Normal respiratory effort, lungs clear to ascultation  Abdomen: Soft, gravid, appropriate for gestational age.  No guarding or tenderness. +CVA tenderness bilaterally  Pelvic: Cervical exam deferred        Extremities: Normal range of motion.  Edema: None  Mental Status: Normal mood and affect. Normal behavior. Normal judgment and thought content.    Weight increase since visit 06/15/19: + ~2 lbs  UA today with 1+ ketones, trace blood and 2+ leuks (otherwise wnl)   Assessment and Plan:  Pregnancy: [id number] at [redacted]w[redacted]d  Mother is present in room. Interpretor offered but declined by both patient and mother.   1. Nausea and Vomiting -Discussed with Dr. Ernestina Patches by phone. Differential diagnosis includes pyelonephritis or a complication of known gallstones such as choledocholythiasis. As she has urinary symptoms in the setting of CVA tenderness and UA with 2+ leuks and trace blood we will treat empirically as suspected pyelonephritis and get labwork to further investigate. Outpatient therapy deemed appropriate as she is well appearing, afebrile, vitals are stable and she is willing and able to return to clinic tomorrow for close follow up. At that visit we'll assess if n/v and urinary symptoms improving, discuss labwork and possibly repeat ceftriaxone injection. -Pt was advised and verbalized understanding to go to the emergency room ASAP if vomiting worsens, she develops a fever, feels lightheaded/dizzy or feels worse in any way.  -Labs today: - CBC with Differential/Platelet - Comprehensive metabolic panel - cefTRIAXone (  ROCEPHIN) injection 1 g - Urinalysis (Urine Dip) - Urine Culture & Sensitivity  2. Supervision of other normal pregnancy,  antepartum -Fetal growth and HR appropriate today. Pt will return tomorrow for f/u of above problem, return in 1 week for 36 week labs/routine prenatal care. -We briefly discussed anemia. She has been consistently seeing hematology at [facility] for infusions and has a next appt 12/8. Hgb 9.7 ten days ago. States she also continues to take iron pills - discuss more at upcoming visits.    Preterm labor symptoms and general obstetric precautions including but not limited to vaginal bleeding, contractions, leaking of fluid and fetal movement were reviewed in detail with the patient.  Please refer to After Visit Summary for other counseling recommendations.  Return in about 1 day (around 06/26/2019), or close follow up and likely repeat antibiotic.  Future Appointments  Date Time Provider Department Center  06/26/2019  8:40 AM [facility] PROVIDER [facility] None  07/02/2019 11:05 AM [facility] PROVIDER [facility] None  07/15/2019  1:30 PM [facility] LAB [facility] None  07/15/2019  1:45 PM Orlie Dakin, Tollie Pizza, MD [facility] None  07/15/2019  2:00 PM [facility] INFUSION CHAIR 4 [facility] None    Ann Held, PA-C

## 2019-06-25 NOTE — Progress Notes (Addendum)
Patient here for MH RV at 35 4/7. Aware of hematology appointment on 07/15/2019. ED visit 06/11/2019 for pain in whole body, states she was told she was having irregular contractions, and may have gall stones. C/O continued irregular contractions, N/V, states she can't keep food down, but is able to drink fluids. C/O sharp, intermittent vaginal pains.Jenetta Downer, RN

## 2019-06-25 NOTE — Progress Notes (Addendum)
Patient given 1 gm. Rocephin per provider orders. Patient scheduled for problem visit in maternity clinic to assess symptoms and assess need for 2nd dose on 06/26/2019 per provider VO. Patient also scheduled for 1 week MH RV.Marland KitchenJenetta Downer, RN

## 2019-06-26 ENCOUNTER — Ambulatory Visit: Payer: Self-pay | Admitting: Physician Assistant

## 2019-06-26 ENCOUNTER — Encounter: Payer: Self-pay | Admitting: Physician Assistant

## 2019-06-26 ENCOUNTER — Other Ambulatory Visit: Payer: Self-pay

## 2019-06-26 VITALS — BP 98/67 | Temp 98.2°F | Wt 155.8 lb

## 2019-06-26 DIAGNOSIS — Z3480 Encounter for supervision of other normal pregnancy, unspecified trimester: Secondary | ICD-10-CM

## 2019-06-26 DIAGNOSIS — O09299 Supervision of pregnancy with other poor reproductive or obstetric history, unspecified trimester: Secondary | ICD-10-CM

## 2019-06-26 DIAGNOSIS — O2303 Infections of kidney in pregnancy, third trimester: Secondary | ICD-10-CM

## 2019-06-26 LAB — COMPREHENSIVE METABOLIC PANEL
ALT: 15 IU/L (ref 0–32)
AST: 15 IU/L (ref 0–40)
Albumin/Globulin Ratio: 1.4 (ref 1.2–2.2)
Albumin: 3.7 g/dL — ABNORMAL LOW (ref 3.9–5.0)
Alkaline Phosphatase: 153 IU/L — ABNORMAL HIGH (ref 39–117)
BUN/Creatinine Ratio: 11 (ref 9–23)
BUN: 4 mg/dL — ABNORMAL LOW (ref 6–20)
Bilirubin Total: 0.3 mg/dL (ref 0.0–1.2)
CO2: 20 mmol/L (ref 20–29)
Calcium: 8.9 mg/dL (ref 8.7–10.2)
Chloride: 100 mmol/L (ref 96–106)
Creatinine, Ser: 0.38 mg/dL — ABNORMAL LOW (ref 0.57–1.00)
GFR calc Af Amer: 174 mL/min/{1.73_m2} (ref >59)
GFR calc non Af Amer: 151 mL/min/{1.73_m2} (ref >59)
Globulin, Total: 2.7 g/dL (ref 1.5–4.5)
Glucose: 73 mg/dL (ref 65–99)
Potassium: 3.6 mmol/L (ref 3.5–5.2)
Sodium: 137 mmol/L (ref 134–144)
Total Protein: 6.4 g/dL (ref 6.0–8.5)

## 2019-06-26 LAB — CBC WITH DIFFERENTIAL/PLATELET
Basophils Absolute: 0 10*3/uL (ref 0.0–0.2)
Basos: 0 %
EOS (ABSOLUTE): 0.1 10*3/uL (ref 0.0–0.4)
Eos: 1 %
Hematocrit: 35.5 % (ref 34.0–46.6)
Hemoglobin: 10.8 g/dL — ABNORMAL LOW (ref 11.1–15.9)
Immature Grans (Abs): 0.1 10*3/uL (ref 0.0–0.1)
Immature Granulocytes: 1 %
Lymphocytes Absolute: 1.5 10*3/uL (ref 0.7–3.1)
Lymphs: 19 %
MCH: 22 pg — ABNORMAL LOW (ref 26.6–33.0)
MCHC: 30.4 g/dL — ABNORMAL LOW (ref 31.5–35.7)
MCV: 72 fL — ABNORMAL LOW (ref 79–97)
Monocytes Absolute: 0.3 10*3/uL (ref 0.1–0.9)
Monocytes: 4 %
Neutrophils Absolute: 5.9 10*3/uL (ref 1.4–7.0)
Neutrophils: 75 %
Platelets: 266 10*3/uL (ref 150–450)
RBC: 4.91 x10E6/uL (ref 3.77–5.28)
RDW: 26.4 % — ABNORMAL HIGH (ref 11.7–15.4)
WBC: 7.9 10*3/uL (ref 3.4–10.8)

## 2019-06-26 MED ORDER — CEFTRIAXONE SODIUM 500 MG IJ SOLR
1000.0000 mg | Freq: Once | INTRAMUSCULAR | Status: AC
  Administered 2019-06-26: 1000 mg via INTRAMUSCULAR

## 2019-06-26 NOTE — Progress Notes (Signed)
Patient here for follow-up and to assess need for more medication. Epic was down at time of visit. Patient given 1 gm Rocephin per provider and scheduled for follow-up for tomorrow, 06/27/2019. Patient states she was feeling a little light headed before injection. After injection given, she stated she was feeling hot, patient opted to sit for a while in room, fan turned on and ginger ale given. Patient left after feeling better, and given reminder card for tomorrow's appointment.Jenetta Downer, RN

## 2019-06-26 NOTE — Progress Notes (Signed)
PRENATAL VISIT NOTE  Subjective:  Pamela Cole is a 23 y.o. G3P2002 at [redacted]w[redacted]d being seen today for ongoing prenatal care and 1-day f/u of possible pyelonephritis. Had 1 g Rocephin IM yest here, and tolerated it well.  She is currently monitored for the following issues for this high-risk pregnancy and has Anemia affecting pregnancy in third trimester; Abnormal cervical cytology; Supervision of other normal pregnancy, antepartum; IUGR (intrauterine growth retardation) in prior pregnancy, pregnant; Vaginal bleeding in pregnancy, second trimester; At risk for abnormal blood glucose level; Abdominal cramping affecting pregnancy; Indication for care in labor and delivery, antepartum; (Suspected) Pyelonephritis affecting pregnancy in third trimester; and History of abnormal cervical Pap smear on their problem list.  Patient reports she still does not feel well. Mid/right back pain somewhat improved, as is nausea. States belly felt tight frequently yest, less so today. No vomiting. Still having some nausea, last vomited once this morning. Drinking water and juice, eating crackers and cereal. No rash..  Contractions: Irritability. Vag. Bleeding: None.  Movement: Present. Denies leaking of fluid/ROM.   The following portions of the patient's history were reviewed and updated as appropriate: allergies, current medications, past family history, past medical history, past social history, past surgical history and problem list. Problem list updated.  Objective:   Vitals:   06/26/19 1132  BP: 98/67  Temp: 98.2 F (36.8 C)  Weight: 155 lb 12.8 oz (70.7 kg)    Fetal Status: Fetal Heart Rate (bpm): 152   Movement: Present  Presentation: Vertex  General:  Alert, oriented and cooperative. Patient is in no acute distress.  Skin: Skin is warm and dry. No rash noted.   Cardiovascular: Normal heart rate noted  Respiratory: Normal respiratory effort, no problems with respiration noted  Abdomen:  Soft, gravid, appropriate for gestational age.  Pain/Pressure: Present     Pelvic: Cervical exam deferred        Extremities: Normal range of motion.  Edema: None  Mental Status: Normal mood and affect. Normal behavior. Normal judgment and thought content.  Has mild R CVAT, but also some diffuse tenderness across her mid and lower back. No rash. No suprapubic pain. Reviewed labs from yest: WBC not elevated, AST/ALT WNL. Urine culture result still pending. Assessment and Plan:  Pregnancy: G3P2002 at [redacted]w[redacted]d  1. (Suspected) Pyelonephritis affecting pregnancy in third trimester Discussed with Vertell Novak MD. Sx somewhat improved, but no definitive dx, possible ascending UTI/early pyelo. Doubt cholangitis in light of afebrile/no elev WBC. May be referred cholecystitis pain in pt with known gallstones. Plan 2nd IM dose of ceftriaxone today, and RTC 1 day for f/u sx evaluation, hope U C&S result avail then. If U C&S confirms UTI and susceptible organism, consider oral Keflex x 10 days. Pt to push fluids and seek emergent care if fever develpps or worsening sx. - cefTRIAXone (ROCEPHIN) injection 1,000 mg  2. Supervision of other normal pregnancy, antepartum  Preterm labor symptoms and general obstetric precautions including but not limited to vaginal bleeding, contractions, leaking of fluid and fetal movement were reviewed in detail with the patient. Please refer to After Visit Summary for other counseling recommendations.    Future Appointments  Date Time Provider [place]  06/27/2019 10:20 AM [facility] PROVIDER [facility] None  07/02/2019 11:05 AM [facility] PROVIDER [facility] None  07/15/2019  1:30 PM [facility] LAB [facility] None  07/15/2019  1:45 PM Grayland Ormond, Kathlene November, MD [facility] None  07/15/2019  2:00 PM [facility] INFUSION CHAIR 4 [facility] None  Landry Dyke, PA-C

## 2019-06-27 ENCOUNTER — Observation Stay: Payer: Medicaid Other

## 2019-06-27 ENCOUNTER — Ambulatory Visit: Payer: Medicaid Other | Admitting: Advanced Practice Midwife

## 2019-06-27 ENCOUNTER — Inpatient Hospital Stay
Admission: EM | Admit: 2019-06-27 | Discharge: 2019-06-30 | DRG: 833 | Disposition: A | Discharge status: Home / Self Care | Payer: Medicaid Other | Attending: Obstetrics and Gynecology | Admitting: Obstetrics and Gynecology

## 2019-06-27 ENCOUNTER — Other Ambulatory Visit: Payer: Self-pay

## 2019-06-27 VITALS — BP 93/67 | HR 97 | Temp 97.7°F | Wt 156.4 lb

## 2019-06-27 DIAGNOSIS — R109 Unspecified abdominal pain: Secondary | ICD-10-CM | POA: Diagnosis present

## 2019-06-27 DIAGNOSIS — B3731 Acute candidiasis of vulva and vagina: Secondary | ICD-10-CM

## 2019-06-27 DIAGNOSIS — O09299 Supervision of pregnancy with other poor reproductive or obstetric history, unspecified trimester: Secondary | ICD-10-CM

## 2019-06-27 DIAGNOSIS — O26893 Other specified pregnancy related conditions, third trimester: Principal | ICD-10-CM | POA: Diagnosis present

## 2019-06-27 DIAGNOSIS — R945 Abnormal results of liver function studies: Secondary | ICD-10-CM

## 2019-06-27 DIAGNOSIS — O99013 Anemia complicating pregnancy, third trimester: Secondary | ICD-10-CM | POA: Diagnosis present

## 2019-06-27 DIAGNOSIS — R1011 Right upper quadrant pain: Secondary | ICD-10-CM | POA: Diagnosis present

## 2019-06-27 DIAGNOSIS — B373 Candidiasis of vulva and vagina: Secondary | ICD-10-CM

## 2019-06-27 DIAGNOSIS — Z3A36 36 weeks gestation of pregnancy: Secondary | ICD-10-CM

## 2019-06-27 DIAGNOSIS — D509 Iron deficiency anemia, unspecified: Secondary | ICD-10-CM | POA: Diagnosis present

## 2019-06-27 DIAGNOSIS — Z3480 Encounter for supervision of other normal pregnancy, unspecified trimester: Secondary | ICD-10-CM

## 2019-06-27 DIAGNOSIS — R7989 Other specified abnormal findings of blood chemistry: Secondary | ICD-10-CM

## 2019-06-27 DIAGNOSIS — O26899 Other specified pregnancy related conditions, unspecified trimester: Secondary | ICD-10-CM | POA: Diagnosis present

## 2019-06-27 LAB — URINALYSIS, COMPLETE (UACMP) WITH MICROSCOPIC
Bacteria, UA: NONE SEEN
Bilirubin Urine: NEGATIVE
Glucose, UA: NEGATIVE mg/dL
Hgb urine dipstick: NEGATIVE
Ketones, ur: 5 mg/dL — AB
Leukocytes,Ua: NEGATIVE
Nitrite: NEGATIVE
Protein, ur: NEGATIVE mg/dL
Specific Gravity, Urine: 1.012 (ref 1.005–1.030)
pH: 7 (ref 5.0–8.0)

## 2019-06-27 LAB — WET PREP FOR TRICH, YEAST, CLUE: Trichomonas Exam: NEGATIVE

## 2019-06-27 LAB — URINALYSIS, ROUTINE W REFLEX MICROSCOPIC
Bilirubin Urine: NEGATIVE
Glucose, UA: NEGATIVE mg/dL
Hgb urine dipstick: NEGATIVE
Ketones, ur: NEGATIVE mg/dL
Nitrite: NEGATIVE
Protein, ur: 30 mg/dL — AB
Specific Gravity, Urine: 1.02 (ref 1.005–1.030)
pH: 7 (ref 5.0–8.0)

## 2019-06-27 LAB — CBC WITH DIFFERENTIAL/PLATELET
Abs Immature Granulocytes: 0.09 10*3/uL — ABNORMAL HIGH (ref 0.00–0.07)
Basophils Absolute: 0 10*3/uL (ref 0.0–0.1)
Basophils Relative: 0 %
Eosinophils Absolute: 0.1 10*3/uL (ref 0.0–0.5)
Eosinophils Relative: 2 %
HCT: 34 % — ABNORMAL LOW (ref 36.0–46.0)
Hemoglobin: 10.6 g/dL — ABNORMAL LOW (ref 12.0–15.0)
Immature Granulocytes: 1 %
Lymphocytes Relative: 19 %
Lymphs Abs: 1.7 10*3/uL (ref 0.7–4.0)
MCH: 22.1 pg — ABNORMAL LOW (ref 26.0–34.0)
MCHC: 31.2 g/dL (ref 30.0–36.0)
MCV: 70.8 fL — ABNORMAL LOW (ref 80.0–100.0)
Monocytes Absolute: 0.4 10*3/uL (ref 0.1–1.0)
Monocytes Relative: 5 %
Neutro Abs: 6.5 10*3/uL (ref 1.7–7.7)
Neutrophils Relative %: 73 %
Platelets: 237 10*3/uL (ref 150–400)
RBC: 4.8 MIL/uL (ref 3.87–5.11)
RDW: 27 % — ABNORMAL HIGH (ref 11.5–15.5)
Smear Review: NORMAL
WBC: 8.8 10*3/uL (ref 4.0–10.5)
nRBC: 0 % (ref 0.0–0.2)

## 2019-06-27 LAB — TYPE AND SCREEN
ABO/RH(D): O POS
Antibody Screen: NEGATIVE

## 2019-06-27 LAB — URINE CULTURE

## 2019-06-27 MED ORDER — DOCUSATE SODIUM 100 MG PO CAPS
100.0000 mg | ORAL_CAPSULE | Freq: Every day | ORAL | Status: DC
  Administered 2019-06-28: 100 mg via ORAL
  Filled 2019-06-27: qty 1

## 2019-06-27 MED ORDER — PRENATAL MULTIVITAMIN CH
1.0000 | ORAL_TABLET | Freq: Every day | ORAL | Status: DC
  Filled 2019-06-27: qty 1

## 2019-06-27 MED ORDER — ONDANSETRON HCL 4 MG/2ML IJ SOLN
4.0000 mg | Freq: Four times a day (QID) | INTRAMUSCULAR | Status: DC | PRN
  Administered 2019-06-28 – 2019-06-29 (×2): 4 mg via INTRAVENOUS
  Filled 2019-06-27 (×3): qty 2

## 2019-06-27 MED ORDER — BETAMETHASONE SOD PHOS & ACET 6 (3-3) MG/ML IJ SUSP
12.0000 mg | INTRAMUSCULAR | Status: AC
  Administered 2019-06-27 – 2019-06-28 (×2): 12 mg via INTRAMUSCULAR
  Filled 2019-06-27: qty 5

## 2019-06-27 MED ORDER — CEFAZOLIN SODIUM-DEXTROSE 1-4 GM/50ML-% IV SOLN
1.0000 g | Freq: Four times a day (QID) | INTRAVENOUS | Status: DC
  Administered 2019-06-27: 1 g via INTRAVENOUS
  Filled 2019-06-27 (×3): qty 50

## 2019-06-27 MED ORDER — ONDANSETRON 4 MG PO TBDP: 4.0000 mg | ORAL_TABLET | Freq: Four times a day (QID) | ORAL | Status: DC | PRN

## 2019-06-27 MED ORDER — OXYCODONE HCL 5 MG PO TABS
5.0000 mg | ORAL_TABLET | Freq: Four times a day (QID) | ORAL | Status: DC | PRN
  Administered 2019-06-28: 5 mg via ORAL
  Filled 2019-06-27: qty 1

## 2019-06-27 MED ORDER — LACTATED RINGERS IV BOLUS
1000.0000 mL | Freq: Once | INTRAVENOUS | Status: AC
  Administered 2019-06-27: 1000 mL via INTRAVENOUS

## 2019-06-27 MED ORDER — ACETAMINOPHEN 325 MG PO TABS: 650.0000 mg | ORAL_TABLET | ORAL | Status: DC | PRN

## 2019-06-27 MED ORDER — CLOTRIMAZOLE 1 % VA CREA: 1.0000 | TOPICAL_CREAM | Freq: Every day | VAGINAL | 0 refills | Status: DC

## 2019-06-27 MED ORDER — CLOTRIMAZOLE 1 % VA CREA
1.0000 | TOPICAL_CREAM | Freq: Every day | VAGINAL | Status: DC
  Administered 2019-06-27 (×2): 1 via VAGINAL

## 2019-06-27 MED ORDER — DEXTROSE IN LACTATED RINGERS 5 % IV SOLN
INTRAVENOUS | Status: DC
  Administered 2019-06-27: 1000 mL via INTRAVENOUS
  Administered 2019-06-28 – 2019-06-29 (×2): via INTRAVENOUS

## 2019-06-27 MED ORDER — PRENATAL MULTIVITAMIN CH: 1.0000 | ORAL_TABLET | Freq: Every day | ORAL | Status: DC

## 2019-06-27 MED ORDER — ACETAMINOPHEN 325 MG PO TABS
650.0000 mg | ORAL_TABLET | ORAL | Status: DC | PRN
  Administered 2019-06-27: 650 mg via ORAL
  Filled 2019-06-27: qty 2

## 2019-06-27 MED ORDER — ACETAMINOPHEN 325 MG PO TABS
650.0000 mg | ORAL_TABLET | ORAL | Status: DC | PRN
  Administered 2019-06-28 – 2019-06-30 (×4): 650 mg via ORAL
  Filled 2019-06-27 (×6): qty 2

## 2019-06-27 MED ORDER — CEFAZOLIN SODIUM-DEXTROSE 1-4 GM/50ML-% IV SOLN
INTRAVENOUS | Status: AC
  Filled 2019-06-27: qty 50

## 2019-06-27 MED ORDER — CEFAZOLIN SODIUM-DEXTROSE 1-4 GM/50ML-% IV SOLN
1.0000 g | Freq: Four times a day (QID) | INTRAVENOUS | Status: DC
  Administered 2019-06-28 (×3): 1 g via INTRAVENOUS
  Filled 2019-06-27 (×5): qty 50

## 2019-06-27 NOTE — Progress Notes (Signed)
Patient ID: Shawnie Dapper, female   DOB: 1996/02/22, 23 y.o.   MRN: [id number] Reviewed records and I have examined the labs and the patient .  Right flank and lower rib cage TTP are c/w renal colic. Renal scan neg for stone . Marland Kitchen She has received 2 doses of outpatient  Rocephin .Possible partial treatment of pyelonephritis . Urine culture  outpt = neg.  Differential is obstructive cholelithiasis. Prior gallbladder scan show 2 stones . Food exacerbates pain . Afebrile , nl WBC   Reassuring fetal monitoring  NPO  D5LR Ancef  1 gm q 6 iv  Repeat gallbladder scan in am Pain meds prn  Betamethasone 12 mgx 2 in case pain doesn't improve and delivery has to be considered  repeat labs in am + NSt in am

## 2019-06-27 NOTE — OB Triage Note (Signed)
Patient sent from [facility] for complaints of lower abdominal pain that comes and goes, and for constant bilateral back pain that is from mid back to lower back. Patient also states that she has N/V with solid food only. States she has been able to keep liquids down.  Also states that she feels like her bladder is full but only voids small amounts. Pt. States that she had 2 shots of antibiotics at [facility] this week and today they gave her medicine for a yeast infection. EFM applied, UA obtained.

## 2019-06-27 NOTE — Progress Notes (Signed)
   PRENATAL VISIT NOTE  Subjective:  Pamela Cole is a 23 y.o. G3P2002 at [redacted]w[redacted]d being seen today for ongoing prenatal care.  She is currently monitored for the following issues for this high-risk pregnancy and has Anemia affecting pregnancy in third trimester; Supervision of high risk pregnancy in third trimester; Abnormal cervical cytology; Supervision of other normal pregnancy, antepartum; IUGR (intrauterine growth retardation) in prior pregnancy, pregnant; Vaginal bleeding in pregnancy, second trimester; At risk for abnormal blood glucose level; Abdominal cramping affecting pregnancy; Indication for care in labor and delivery, antepartum; (Suspected) Pyelonephritis affecting pregnancy in third trimester; and History of abnormal cervical Pap smear on their problem list.  Patient reports back pain, N&V, abdominal tightening yesterday, internal vaginal burning.  Contractions: Irritability. Vag. Bleeding: None.  Movement: Present. Denies leaking of fluid/ROM.   The following portions of the patient's history were reviewed and updated as appropriate: allergies, current medications, past family history, past medical history, past social history, past surgical history and problem list. Problem list updated.  Objective:   Vitals:   06/27/19 1016  BP: 93/67  Pulse: 97  Temp: 97.7 F (36.5 C)  Weight: 156 lb 6.4 oz (70.9 kg)    Fetal Status: Fetal Heart Rate (bpm): 150 Fundal Height: 35 cm Movement: Present  Presentation: Vertex  General:  Alert, oriented and cooperative. Patient is in no acute distress.  Skin: Skin is warm and dry. No rash noted.   Cardiovascular: Normal heart rate noted  Respiratory: Normal respiratory effort, no problems with respiration noted  Abdomen: Soft, gravid, appropriate for gestational age.  Pain/Pressure: Present     Pelvic: Cervical exam deferred        Extremities: Normal range of motion.  Edema: None  Mental Status: Normal mood and affect. Normal  behavior. Normal judgment and thought content.   Assessment and Plan:  Pregnancy: G3P2002 at [redacted]w[redacted]d  1. IUGR (intrauterine growth retardation) in prior pregnancy, pregnant 08/22/13 5#4 at 73 5/7  2. Supervision of other normal pregnancy, antepartum G3P2 seen 06/25/19 c/o 1 week of mid to lower back pain and +CVAT on right, nausea and vomiting of solid foods only. Received Rocephin 06/25/19 and 06/26/19 IM.  C&S (06/25/19) 25-50,000 mixed flora, RBC tr, ketones 1+, leuk 2+. Denies dysuria but c/o voiding small amts with frequency.  +CVAT. Continues to be afebrile. Pt states today she is no better than on 06/25/19.  Back pain is worsening and now encompasses entire back, +CVAT with pain scale 7/10, N&V worsening with vomiting of all solids and now nausea with po fluids as well.   Also c/o belly tightening all day yesterday.  Anemia with consult done with Dr. Grayland Ormond at [facility] this pregnancy. To L&D for galllbadder and kidney u/s, IVF, etc; Dr. Ernestina Patches agrees with plan.  T/C with Dr. Ouida Sills as well.  - WET PREP FOR [place], YEAST, CLUE   Preterm labor symptoms and general obstetric precautions including but not limited to vaginal bleeding, contractions, leaking of fluid and fetal movement were reviewed in detail with the patient. Please refer to After Visit Summary for other counseling recommendations.  No follow-ups on file.  Future Appointments  Date Time Provider [place]  07/02/2019 11:05 AM [facility] PROVIDER [facility] None  07/15/2019  1:30 PM [facility] LAB [facility] None  07/15/2019  1:45 PM Grayland Ormond, Kathlene November, MD [facility] None  07/15/2019  2:00 PM [place] INFUSION CHAIR 4 [facility] None    Herbie Saxon, CNM

## 2019-06-27 NOTE — Discharge Summary (Signed)
Patient ID: Pamela Cole MRN: [id number] DOB/AGE: 22/05/1996 22 y.o.  Admit date: 06/27/2019 Discharge date: 06/28/2019  Admission Diagnoses: RUQ pain radiating to her back, epigastric pain, vomiting with solid foods   Discharge Diagnoses: per Dr. Allegra Lai - Differentials include biliary colic or gastritis or choledocholithiasis  Antenatal Procedures:  Imaging Renal US - Normal sonographic appearance of the kidneys US Gallbladder - WNL MRI - WNL Labs UA CBC/CMP WBCs H.pylor IGM, IGG H.pylori antigen Lipase Type and Screen OB NST  Consults: Gastroenterology  Significant Diagnostic Studies:  Results for orders placed or performed during the hospital encounter of 06/27/19 (from the past 168 hour(s))  Urinalysis, Routine w reflex microscopic   Collection Time: 06/27/19  1:26 PM  Result Value Ref Range   Color, Urine YELLOW (A) YELLOW   APPearance CLOUDY (A) CLEAR   Specific Gravity, Urine 1.020 1.005 - 1.030   pH 7.0 5.0 - 8.0   Glucose, UA NEGATIVE NEGATIVE mg/dL   Hgb urine dipstick NEGATIVE NEGATIVE   Bilirubin Urine NEGATIVE NEGATIVE   Ketones, ur NEGATIVE NEGATIVE mg/dL   Protein, ur 30 (A) NEGATIVE mg/dL   Nitrite NEGATIVE NEGATIVE   Leukocytes,Ua LARGE (A) NEGATIVE   RBC / HPF 21-50 0 - 5 RBC/hpf   WBC, UA 21-50 0 - 5 WBC/hpf   Bacteria, UA FEW (A) NONE SEEN   Squamous Epithelial / LPF 21-50 0 - 5   Mucus PRESENT   Type and screen [facility]   Collection Time: 06/27/19  1:37 PM  Result Value Ref Range   ABO/RH(D) O POS    Antibody Screen NEG    Sample Expiration      06/30/2019,2359 Performed at [facility], [address] [place], [place] [address]   CBC with Differential/Platelet   Collection Time: 06/27/19  1:43 PM  Result Value Ref Range   WBC 8.8 4.0 - 10.5 K/uL   RBC 4.80 3.87 - 5.11 MIL/uL   Hemoglobin 10.6 (L) 12.0 - 15.0 g/dL   HCT 96.2 (L) 95.2 - 84.1 %   MCV 70.8 (L) 80.0 - 100.0 fL   MCH 22.1  (L) 26.0 - 34.0 pg   MCHC 31.2 30.0 - 36.0 g/dL   RDW 32.4 (H) 40.1 - 02.7 %   Platelets 237 150 - 400 K/uL   nRBC 0.0 0.0 - 0.2 %   Neutrophils Relative % 73 %   Neutro Abs 6.5 1.7 - 7.7 K/uL   Lymphocytes Relative 19 %   Lymphs Abs 1.7 0.7 - 4.0 K/uL   Monocytes Relative 5 %   Monocytes Absolute 0.4 0.1 - 1.0 K/uL   Eosinophils Relative 2 %   Eosinophils Absolute 0.1 0.0 - 0.5 K/uL   Basophils Relative 0 %   Basophils Absolute 0.0 0.0 - 0.1 K/uL   WBC Morphology MORPHOLOGY UNREMARKABLE    RBC Morphology MIXED  RBCS POPULATION    Smear Review Normal platelet morphology    Immature Granulocytes 1 %   Abs Immature Granulocytes 0.09 (H) 0.00 - 0.07 K/uL   Schistocytes PRESENT   Urinalysis, Complete w Microscopic   Collection Time: 06/27/19  6:14 PM  Result Value Ref Range   Color, Urine YELLOW (A) YELLOW   APPearance CLEAR (A) CLEAR   Specific Gravity, Urine 1.012 1.005 - 1.030   pH 7.0 5.0 - 8.0   Glucose, UA NEGATIVE NEGATIVE mg/dL   Hgb urine dipstick NEGATIVE NEGATIVE   Bilirubin Urine NEGATIVE NEGATIVE   Ketones, ur 5 (  A) NEGATIVE mg/dL   Protein, ur NEGATIVE NEGATIVE mg/dL   Nitrite NEGATIVE NEGATIVE   Leukocytes,Ua NEGATIVE NEGATIVE   RBC / HPF 0-5 0 - 5 RBC/hpf   WBC, UA 0-5 0 - 5 WBC/hpf   Bacteria, UA NONE SEEN NONE SEEN   Squamous Epithelial / LPF 0-5 0 - 5   Mucus PRESENT   CBC   Collection Time: 06/28/19  6:08 AM  Result Value Ref Range   WBC 8.4 4.0 - 10.5 K/uL   RBC 4.57 3.87 - 5.11 MIL/uL   Hemoglobin 9.9 (L) 12.0 - 15.0 g/dL   HCT 16.133.4 (L) 09.636.0 - 04.546.0 %   MCV 73.1 (L) 80.0 - 100.0 fL   MCH 21.7 (L) 26.0 - 34.0 pg   MCHC 29.6 (L) 30.0 - 36.0 g/dL   RDW 40.926.6 (H) 81.111.5 - 91.415.5 %   Platelets 220 150 - 400 K/uL   nRBC 0.0 0.0 - 0.2 %  Comprehensive metabolic panel   Collection Time: 06/28/19  6:08 AM  Result Value Ref Range   Sodium 135 135 - 145 mmol/L   Potassium 3.6 3.5 - 5.1 mmol/L   Chloride 106 98 - 111 mmol/L   CO2 20 (L) 22 - 32 mmol/L    Glucose, Bld 118 (H) 70 - 99 mg/dL   BUN <5 (L) 6 - 20 mg/dL   Creatinine, Ser <7.82<0.30 (L) 0.44 - 1.00 mg/dL   Calcium 8.6 (L) 8.9 - 10.3 mg/dL   Total Protein 6.3 (L) 6.5 - 8.1 g/dL   Albumin 2.9 (L) 3.5 - 5.0 g/dL   AST 17 15 - 41 U/L   ALT 15 0 - 44 U/L   Alkaline Phosphatase 136 (H) 38 - 126 U/L   Total Bilirubin 0.5 0.3 - 1.2 mg/dL   GFR calc non Af Amer NOT CALCULATED >60 mL/min   GFR calc Af Amer NOT CALCULATED >60 mL/min   Anion gap 9 5 - 15  Lipase, blood   Collection Time: 06/28/19  6:15 PM  Result Value Ref Range   Lipase 22 11 - 51 U/L  Results for orders placed or performed in visit on 06/27/19 (from the past 168 hour(s))  WET PREP FOR TRICH, YEAST, CLUE   Collection Time: 06/27/19 11:41 AM  Result Value Ref Range   Trichomonas Exam Negative Negative   Yeast Exam MOD Negative   Clue Cell Exam Comment: Negative  Results for orders placed or performed in visit on 06/25/19 (from the past 168 hour(s))  Urine Culture & Sensitivity   Collection Time: 06/25/19  9:45 AM   Specimen: Urine   UR  Result Value Ref Range   Urine Culture, Routine Final report    Organism ID, Bacteria Comment   CBC with Differential/Platelet   Collection Time: 06/25/19 10:30 AM  Result Value Ref Range   WBC 7.9 3.4 - 10.8 x10E3/uL   RBC 4.91 3.77 - 5.28 x10E6/uL   Hemoglobin 10.8 (L) 11.1 - 15.9 g/dL   Hematocrit 95.635.5 21.334.0 - 46.6 %   MCV 72 (L) 79 - 97 fL   MCH 22.0 (L) 26.6 - 33.0 pg   MCHC 30.4 (L) 31.5 - 35.7 g/dL   RDW 08.626.4 (H) 57.811.7 - 46.915.4 %   Platelets 266 150 - 450 x10E3/uL   Neutrophils 75 Not Estab. %   Lymphs 19 Not Estab. %   Monocytes 4 Not Estab. %   Eos 1 Not Estab. %   Basos 0 Not Estab. %   Neutrophils  Absolute 5.9 1.4 - 7.0 x10E3/uL   Lymphocytes Absolute 1.5 0.7 - 3.1 x10E3/uL   Monocytes Absolute 0.3 0.1 - 0.9 x10E3/uL   EOS (ABSOLUTE) 0.1 0.0 - 0.4 x10E3/uL   Basophils Absolute 0.0 0.0 - 0.2 x10E3/uL   Immature Granulocytes 1 Not Estab. %   Immature Grans (Abs)  0.1 0.0 - 0.1 x10E3/uL   Hematology Comments: Note:   Comprehensive metabolic panel   Collection Time: 06/25/19 10:30 AM  Result Value Ref Range   Glucose 73 65 - 99 mg/dL   BUN 4 (L) 6 - 20 mg/dL   Creatinine, Ser 0.38 (L) 0.57 - 1.00 mg/dL   GFR calc non Af Amer 151 >59 mL/min/1.73   GFR calc Af Amer 174 >59 mL/min/1.73   BUN/Creatinine Ratio 11 9 - 23   Sodium 137 134 - 144 mmol/L   Potassium 3.6 3.5 - 5.2 mmol/L   Chloride 100 96 - 106 mmol/L   CO2 20 20 - 29 mmol/L   Calcium 8.9 8.7 - 10.2 mg/dL   Total Protein 6.4 6.0 - 8.5 g/dL   Albumin 3.7 (L) 3.9 - 5.0 g/dL   Globulin, Total 2.7 1.5 - 4.5 g/dL   Albumin/Globulin Ratio 1.4 1.2 - 2.2   Bilirubin Total 0.3 0.0 - 1.2 mg/dL   Alkaline Phosphatase 153 (H) 39 - 117 IU/L   AST 15 0 - 40 IU/L   ALT 15 0 - 32 IU/L  Urinalysis (Urine Dip)   Collection Time: 06/25/19 11:10 AM  Result Value Ref Range   Specific Gravity, UA 1.020 1.005 - 1.030   pH, UA 7.0 5.0 - 7.5   Color, UA Yellow Yellow   Appearance Ur Hazy (A) Clear   Leukocytes,UA 2+ (A) Negative   Protein,UA Negative Negative/Trace   Glucose, UA Negative Negative   Ketones, UA 1+ (A) Negative   RBC, UA Trace (A) Negative   Bilirubin, UA Negative Negative   Urobilinogen, Ur 1.0 0.2 - 1.0 mg/dL   Nitrite, UA Negative Negative    Treatments:   Hospital Course: This is a 23 y.o. [id number] with IUP at [redacted]w[redacted]d admitted for: RUQ/right flank/epigastric pain  Prior to admission: - HD treated her with  Rocephin IM 06/25/19 and 06/26/19 - HD treated for yeast.06/27/19 - Per the HD: she reported back pain, N&V, abdominal tightening, internal vaginal burning.  Contractions: Irritability. Vag. - Bleeding: None.  Movement: Present. Denies leaking of fluid/ROM.  Day 1 (06/27/19) Pt reported RUQ pain radiating to her right flank, and N/V with solid foods (able to keep down fluids).  Last BM yesterday. Pt also reports vaginal itching and urinary urgency. Pt denies dysuria.   No  leaking of fluid and no bleeding.   - Ancef q6 hrs for suspected Pyelo - Renal US  IMPRESSION: Normal sonographic appearance of the kidneys. - Roxicodone, Tylenol, Zofran, D5LR - Betamethasone x2 doses - Normal sonographic appearance of the kidneys - UA (cath) - neg - CBC - WNL - NST is reassuring, no signs of labor  Day 2 (06/28/19) RUQ pain continued and becoming worse. Vomiting with solids including apple sauce this morning. Clear liquids does not trigger this reaction. Pain starts with the vomiting and is significant, lasting approx 5 min.   Patient reported good fetal movement.  No BM today. She reported no uterine contractions, no bleeding and no loss of fluid per vagina.  However, with pain in RUQ she endorses vaginal pressure, suprapubic tenderness. - Gastroenterology consult  Recommendations: ERCP Check serum lipase -  22 (06/28/19) Monitor LFTs daily - AST 17, ALT 15 (06/28/19)  AST 15 ALT 15 (06/25/19) Check H. pylori IgG - pending H. pylori stool antigen - pt unable to provide sample at this time Treat H. pylori if positive  Continue IV fluids, pain management and antiemetics Start Protonix 40 mg IV twice daily Clear liquid diet - Imaging Mr Abdomen Mrcp Wo Contrast IMPRESSION: 1. Mild diffuse hepatic steatosis.  No liver masses. 2. No cholelithiasis.  No evidence of acute cholecystitis. 3. No biliary ductal dilatation. No evidence of choledocholithiasis. 4. Tiny 3 mm gallbladder polyp, as seen on sonogram from earlier today, compatible with benign cholesterol polyp, requiring no follow-up. 5. Mild bilateral pelvicaliectasis, left greater than right, more likely physiologic due to advanced gestational age given absence of perinephric edema. US Abdomen Limited Ruq IMPRESSION: Incidental gallbladder polyps. Negative for gallstones or cholecystitis. No biliary dilatation or obstruction Hepatic steatosis Mild right kidney pelviectasis versus developing hydronephrosis. - Ancef  d/c - Roxicodone, Tylenol, Zofran, D5LR - Protonix  BID IV - Bentyl  supp once - Maalox/Mylanta - CBC/CMP - WNL - WBCs normal - H.pylori antigen - in process - Lipase - normal - NST daily ordered Reactive  Day 3 (06/29/19) Today signification pain has resolved, she only complaining of a burning sensation in the RUQ/epigastric area now.  Nausea is reduced and no vomiting.  New complaint of regular abdominal tightening. -Continued with GI's recommendations - Advanced Diet to regular - ate a sandwich, well tolerated  - Saline locked IV - NST - Reactive, Short episode of regular UCs, slowed and then stopped - SVE: closed, long - Changed Protonix 40 mg IV BID to Protonix  PO BID - Maalox/Mylanta - Colace  BID PO - Continue to monitor for labor  See Dr. Francesca Oman Discharge [place] for course after 06/29/2019  Cyril Mourning 07/10/19 3:39 PM

## 2019-06-27 NOTE — Progress Notes (Signed)
Taking PNV QD and iron tablet TID. Correctly verbalizes how to take medicines. Aware of [place] RV appt 07/02/2019 (arrive 1050) and 07/15/2019 Hematology appt. Rich Number, RN  Wet mount reviewed and treated for yeast per standing order. Rich Number, RN

## 2019-06-28 ENCOUNTER — Inpatient Hospital Stay: Payer: Medicaid Other

## 2019-06-28 DIAGNOSIS — R1011 Right upper quadrant pain: Secondary | ICD-10-CM

## 2019-06-28 LAB — COMPREHENSIVE METABOLIC PANEL
ALT: 15 U/L (ref 0–44)
AST: 17 U/L (ref 15–41)
Albumin: 2.9 g/dL — ABNORMAL LOW (ref 3.5–5.0)
Alkaline Phosphatase: 136 U/L — ABNORMAL HIGH (ref 38–126)
Anion gap: 9 (ref 5–15)
BUN: <5 mg/dL — ABNORMAL LOW (ref 6–20)
CO2: 20 mmol/L — ABNORMAL LOW (ref 22–32)
Calcium: 8.6 mg/dL — ABNORMAL LOW (ref 8.9–10.3)
Chloride: 106 mmol/L (ref 98–111)
Creatinine, Ser: <0.3 mg/dL — ABNORMAL LOW (ref 0.44–1.00)
Glucose, Bld: 118 mg/dL — ABNORMAL HIGH (ref 70–99)
Potassium: 3.6 mmol/L (ref 3.5–5.1)
Sodium: 135 mmol/L (ref 135–145)
Total Bilirubin: 0.5 mg/dL (ref 0.3–1.2)
Total Protein: 6.3 g/dL — ABNORMAL LOW (ref 6.5–8.1)

## 2019-06-28 LAB — CBC
HCT: 33.4 % — ABNORMAL LOW (ref 36.0–46.0)
Hemoglobin: 9.9 g/dL — ABNORMAL LOW (ref 12.0–15.0)
MCH: 21.7 pg — ABNORMAL LOW (ref 26.0–34.0)
MCHC: 29.6 g/dL — ABNORMAL LOW (ref 30.0–36.0)
MCV: 73.1 fL — ABNORMAL LOW (ref 80.0–100.0)
Platelets: 220 10*3/uL (ref 150–400)
RBC: 4.57 MIL/uL (ref 3.87–5.11)
RDW: 26.6 % — ABNORMAL HIGH (ref 11.5–15.5)
WBC: 8.4 10*3/uL (ref 4.0–10.5)
nRBC: 0 % (ref 0.0–0.2)

## 2019-06-28 LAB — LIPASE, BLOOD: Lipase: 22 U/L (ref 11–51)

## 2019-06-28 MED ORDER — PANTOPRAZOLE SODIUM 40 MG IV SOLR
40.0000 mg | Freq: Two times a day (BID) | INTRAVENOUS | Status: DC
  Administered 2019-06-28 – 2019-06-29 (×2): 40 mg via INTRAVENOUS
  Filled 2019-06-28 (×3): qty 40

## 2019-06-28 MED ORDER — ALUM & MAG HYDROXIDE-SIMETH 200-200-20 MG/5ML PO SUSP
30.0000 mL | Freq: Once | ORAL | Status: AC
  Administered 2019-06-28: 30 mL via ORAL
  Filled 2019-06-28: qty 30

## 2019-06-28 MED ORDER — LIDOCAINE VISCOUS HCL 2 % MT SOLN
15.0000 mL | Freq: Once | OROMUCOSAL | Status: DC
  Filled 2019-06-28 (×2): qty 15

## 2019-06-28 MED ORDER — DICYCLOMINE HCL 10 MG/5ML PO SOLN
10.0000 mg | Freq: Once | ORAL | Status: AC
  Administered 2019-06-28: 10 mg via ORAL
  Filled 2019-06-28: qty 5

## 2019-06-28 NOTE — Progress Notes (Addendum)
ANTEPARTUM COMPREHENSIVE PROGRESS NOTE  Pamela Cole is a 23 y.o. G3P2002 at 6620w0d who is admitted for RUQ/right flank and epigastric pain x1 week.  S/p ancef and rocephin for presumed pyleo; however, no leukocytosis or fever at this time. No blood in urine, no dysuria.  Estimated Date of Delivery: 07/26/19  Length of Stay:  1 Days. Admitted 06/27/2019  Subjective: RUQ pain Vomiting with solids including apple sauce this morning. Clear liquids do not trigger this reaction. Pain starts with the vomiting and is significant, lasting approx 5 min.  Patient reports good fetal movement.  She reports no uterine contractions, no bleeding and no loss of fluid per vagina. However, with pain in RUQ she endorses vaginal pressure, suprapubic tenderness.  NST reactive daily  Vitals:  Blood pressure 106/62, pulse 81, temperature 98.8 F (37.1 C), temperature source Oral, resp. rate 18, height 4\' 11"  (1.499 m), weight 70.3 kg, SpO2 98 %, unknown if currently breastfeeding. Physical Examination: CONSTITUTIONAL: Well-developed, well-nourished female in no acute distress.  HENT:  Normocephalic, atraumatic EYES: Conjunctivae and EOM are normal. No scleral icterus.  NECK: Normal range of motion, supple, no masses SKIN: Skin is warm and dry. No rash noted. Not diaphoretic. No erythema. No pallor. NEUROLGIC: Alert and oriented to person, place, and time. PSYCHIATRIC: Normal mood and affect. Normal behavior. Normal judgment and thought content. RESPIRATORY: no problems with respiration noted MUSCULOSKELETAL:  No edema and no tenderness. ABDOMEN: Soft, nontender, nondistended, gravid. No fundal tenderness CERVIX:  deferred  Fetal monitoring: FHR: 145 bpm, Variability: moderate, Accelerations: Present, Decelerations: Absent  Uterine activity: irritability  Results for orders placed or performed during the hospital encounter of 06/27/19 (from the past 48 hour(s))  Urinalysis, Routine w reflex  microscopic     Status: Abnormal   Collection Time: 06/27/19  1:26 PM  Result Value Ref Range   Color, Urine YELLOW (A) YELLOW   APPearance CLOUDY (A) CLEAR   Specific Gravity, Urine 1.020 1.005 - 1.030   pH 7.0 5.0 - 8.0   Glucose, UA NEGATIVE NEGATIVE mg/dL   Hgb urine dipstick NEGATIVE NEGATIVE   Bilirubin Urine NEGATIVE NEGATIVE   Ketones, ur NEGATIVE NEGATIVE mg/dL   Protein, ur 30 (A) NEGATIVE mg/dL   Nitrite NEGATIVE NEGATIVE   Leukocytes,Ua LARGE (A) NEGATIVE   RBC / HPF 21-50 0 - 5 RBC/hpf   WBC, UA 21-50 0 - 5 WBC/hpf   Bacteria, UA FEW (A) NONE SEEN   Squamous Epithelial / LPF 21-50 0 - 5   Mucus PRESENT     Comment: Performed at [facility], [address] [place], [place] [address]  Type and screen [facility]     Status: None   Collection Time: 06/27/19  1:37 PM  Result Value Ref Range   ABO/RH(D) O POS    Antibody Screen NEG    Sample Expiration      06/30/2019,2359 Performed at [facility], [address] [place], [place] [address]   CBC with Differential/Platelet     Status: Abnormal   Collection Time: 06/27/19  1:43 PM  Result Value Ref Range   WBC 8.8 4.0 - 10.5 K/uL   RBC 4.80 3.87 - 5.11 MIL/uL   Hemoglobin 10.6 (L) 12.0 - 15.0 g/dL   HCT 09.834.0 (L) 11.936.0 - 14.746.0 %   MCV 70.8 (L) 80.0 - 100.0 fL   MCH 22.1 (L) 26.0 - 34.0 pg   MCHC 31.2 30.0 - 36.0 g/dL   RDW 82.927.0 (  H) 11.5 - 15.5 %   Platelets 237 150 - 400 K/uL   nRBC 0.0 0.0 - 0.2 %   Neutrophils Relative % 73 %   Neutro Abs 6.5 1.7 - 7.7 K/uL   Lymphocytes Relative 19 %   Lymphs Abs 1.7 0.7 - 4.0 K/uL   Monocytes Relative 5 %   Monocytes Absolute 0.4 0.1 - 1.0 K/uL   Eosinophils Relative 2 %   Eosinophils Absolute 0.1 0.0 - 0.5 K/uL   Basophils Relative 0 %   Basophils Absolute 0.0 0.0 - 0.1 K/uL   WBC Morphology MORPHOLOGY UNREMARKABLE    RBC Morphology MIXED  RBCS POPULATION    Smear Review Normal platelet morphology    Immature Granulocytes 1 %    Abs Immature Granulocytes 0.09 (H) 0.00 - 0.07 K/uL   Schistocytes PRESENT     Comment: Performed at [facility], [address] [place], [place] [address]  Urinalysis, Complete w Microscopic     Status: Abnormal   Collection Time: 06/27/19  6:14 PM  Result Value Ref Range   Color, Urine YELLOW (A) YELLOW   APPearance CLEAR (A) CLEAR   Specific Gravity, Urine 1.012 1.005 - 1.030   pH 7.0 5.0 - 8.0   Glucose, UA NEGATIVE NEGATIVE mg/dL   Hgb urine dipstick NEGATIVE NEGATIVE   Bilirubin Urine NEGATIVE NEGATIVE   Ketones, ur 5 (A) NEGATIVE mg/dL   Protein, ur NEGATIVE NEGATIVE mg/dL   Nitrite NEGATIVE NEGATIVE   Leukocytes,Ua NEGATIVE NEGATIVE   RBC / HPF 0-5 0 - 5 RBC/hpf   WBC, UA 0-5 0 - 5 WBC/hpf   Bacteria, UA NONE SEEN NONE SEEN   Squamous Epithelial / LPF 0-5 0 - 5   Mucus PRESENT     Comment: Performed at [facility], [address] [place], [place] [address]  CBC     Status: Abnormal   Collection Time: 06/28/19  6:08 AM  Result Value Ref Range   WBC 8.4 4.0 - 10.5 K/uL   RBC 4.57 3.87 - 5.11 MIL/uL   Hemoglobin 9.9 (L) 12.0 - 15.0 g/dL   HCT 37.1 (L) 06.2 - 69.4 %   MCV 73.1 (L) 80.0 - 100.0 fL   MCH 21.7 (L) 26.0 - 34.0 pg   MCHC 29.6 (L) 30.0 - 36.0 g/dL   RDW 85.4 (H) 62.7 - 03.5 %   Platelets 220 150 - 400 K/uL   nRBC 0.0 0.0 - 0.2 %    Comment: Performed at [facility], [address] [place], [place] [address]  Comprehensive metabolic panel     Status: Abnormal   Collection Time: 06/28/19  6:08 AM  Result Value Ref Range   Sodium 135 135 - 145 mmol/L   Potassium 3.6 3.5 - 5.1 mmol/L   Chloride 106 98 - 111 mmol/L   CO2 20 (L) 22 - 32 mmol/L   Glucose, Bld 118 (H) 70 - 99 mg/dL   BUN <5 (L) 6 - 20 mg/dL   Creatinine, Ser <1.82 (L) 0.44 - 1.00 mg/dL   Calcium 8.6 (L) 8.9 - 10.3 mg/dL   Total Protein 6.3 (L) 6.5 - 8.1 g/dL   Albumin 2.9 (L) 3.5 - 5.0 g/dL   AST 17 15 - 41 U/L   ALT 15 0 - 44 U/L   Alkaline Phosphatase  136 (H) 38 - 126 U/L   Total Bilirubin 0.5 0.3 - 1.2 mg/dL   GFR calc non Af Amer NOT CALCULATED >60 mL/min  GFR calc Af Amer NOT CALCULATED >60 mL/min   Anion gap 9 5 - 15    Comment: Performed at [facility], [address] [place], [place] [address]    US Renal  Result Date: 06/27/2019 CLINICAL DATA:  Patient is approximately [redacted] weeks pregnant. Right upper quadrant and right flank pain. Suspect pyelonephritis versus kidney stones. EXAM: RENAL / URINARY TRACT ULTRASOUND COMPLETE COMPARISON:  None. FINDINGS: Right Kidney: Renal measurements: 11.3 x 5.6 x 5.6 cm = volume: 184 mL . Echogenicity within normal limits. No mass or hydronephrosis visualized. Left Kidney: Renal measurements: 12.6 x 6.1 x 7.3 cm = volume: 291 mL. Echogenicity within normal limits. No mass or hydronephrosis visualized. Bladder: Decompressed and not well evaluated. Other: None. IMPRESSION: 1. Normal sonographic appearance of the kidneys. Electronically Signed   By: Lajean Manes M.D.   On: 06/27/2019 14:36   US Abdomen Limited Ruq  Result Date: 06/28/2019 CLINICAL DATA:  Right upper quadrant abdominal pain EXAM: ULTRASOUND ABDOMEN LIMITED RIGHT UPPER QUADRANT COMPARISON:  06/11/2019 FINDINGS: Gallbladder: Small subcentimeter echogenic nonshadowing foci along the gallbladder wall compatible with polyps. Normal wall thickness measures 1.7 mm. No definite gallstones. No Murphy's sign or pericholecystic fluid. No signs of cholecystitis. Common bile duct: Diameter: 3.9 mm Liver: Increased hepatic echogenicity compatible with background hepatic steatosis. Small area of fatty sparing along the gallbladder noted. No intrahepatic biliary dilatation or definite focal hepatic abnormality. Portal vein is patent on color Doppler imaging with normal direction of blood flow towards the liver. Other: Included imaging of the right kidney upper pole demonstrates distension of the collecting system compatible with pelviectasis  versus mild hydronephrosis. This appears new compared to 06/11/2019. IMPRESSION: Incidental gallbladder polyps. Negative for gallstones or cholecystitis. No biliary dilatation or obstruction Hepatic steatosis Mild right kidney pelviectasis versus developing hydronephrosis. Electronically Signed   By: Jerilynn Mages.  Shick M.D.   On: 06/28/2019 10:14    Current scheduled medications . alum & mag hydroxide-simeth  30 mL Oral Once   And  . lidocaine  15 mL Oral Once  . betamethasone acetate-betamethasone sodium phosphate  12 mg Intramuscular Q24H  . dicyclomine  10 mg Oral Once  . docusate sodium  100 mg Oral Daily  . pantoprazole (PROTONIX) IV  40 mg Intravenous Q12H  . prenatal multivitamin  1 tablet Oral Q1200    I have reviewed the patient's current medications.  ASSESSMENT: Patient Active Problem List   Diagnosis Date Noted  . Abdominal pain affecting pregnancy, antepartum 06/27/2019  . Right upper quadrant abdominal pain 06/27/2019  . (Suspected) Pyelonephritis affecting pregnancy in third trimester 06/25/2019  . History of abnormal cervical Pap smear 06/25/2019  . Indication for care in labor and delivery, antepartum 06/15/2019  . Abdominal cramping affecting pregnancy 05/27/2019  . At risk for abnormal blood glucose level 05/19/2019  . Vaginal bleeding in pregnancy, second trimester 04/24/2019  . IUGR (intrauterine growth retardation) in prior pregnancy, pregnant 02/18/2019  . Abnormal cervical cytology 01/30/2019  . Supervision of other normal pregnancy, antepartum 01/30/2019  . Supervision of high risk pregnancy in third trimester 06/01/2016  . Anemia affecting pregnancy in third trimester 04/17/2016   ASSESSMENT:   DDx includes biliary colic, peptic ulcer. Less likely pyleo or kidney stones, placenta abruption, intraamniotic infection, preterm labor.  No evidence of pancreatitis, cholelithiasis, cholecystis on imaging or labs. No evidence of pyleo with normal WBC,  afebrile.  Gallblader scan unremarkable Renal u/s without evidence of obstruction,+mild right hydronephrosis  PLAN:  1. RUQ Pain -  continue IVF - advance to clear diet from NPO - GI cocktail now - start protonix  iv BID - consult to GI  2. Fetal status: - s/p BMZ - continue daily NSTs  Activity encouraged as tolerated

## 2019-06-28 NOTE — H&P (Signed)
Pamela Cole is a 23 y.o. female presenting for RUQ pain.  OB History    Gravida  3   Para  2   Term  2   Preterm      AB      Living  2     SAB      TAB      Ectopic      Multiple  0   Live Births  2          Past Medical History:  Diagnosis Date  . Anemia   . Cholelithiasis   . UTI (urinary tract infection)    Hx of UTI x 1   Past Surgical History:  Procedure Laterality Date  . NO PAST SURGERIES    . TOOTH EXTRACTION Left 01/07/2019   Family History: family history includes Asthma in her brother; Breast cancer in her maternal grandmother; Hypertension in her mother. Social History:  reports that she has never smoked. She has never used smokeless tobacco. She reports that she does not drink alcohol or use drugs.     Maternal Diabetes: No Genetic Screening: Normal Maternal Ultrasounds/Referrals: Normal Fetal Ultrasounds or other Referrals:  None Maternal Substance Abuse:  No Significant Maternal Medications:  None Significant Maternal Lab Results:  Group B Strep negative Other Comments:  None  Review of Systems  All other systems reviewed and are negative.  History   Blood pressure 105/68, pulse 78, temperature 98.4 F (36.9 C), temperature source Oral, resp. rate 18, height 4\' 11"  (1.499 m), weight 70.3 kg, SpO2 98 %, unknown if currently breastfeeding. Maternal Exam:  Abdomen: Patient reports the following abdominal tenderness: RUQ.  Introitus: not evaluated.   Cervix: not evaluated.   Physical Exam  Constitutional: She is oriented to person, place, and time. She appears well-developed. She appears distressed.  HENT:  Head: Normocephalic.  Neck: Normal range of motion.  Cardiovascular: Normal rate and regular rhythm.  Respiratory: Effort normal.  GI: Soft. There is abdominal tenderness in the right upper quadrant. There is guarding.  Musculoskeletal: Normal range of motion.  Neurological: She is alert and oriented to  person, place, and time.  Skin: Skin is warm and dry.  Psychiatric: She has a normal mood and affect. Her behavior is normal. Judgment and thought content normal.    Prenatal labs: ABO, Rh: --/--/O POS (11/20 1337) Antibody: NEG (11/20 1337) Rubella: Immune (06/04 0000) RPR: Non Reactive (10/09 1013)  HBsAg: Negative (06/04 0000)  HIV: Non reactive (10/09 0000)  GBS:   neg  Assessment/Plan: Right upper quadrant pain - Renal US - Normal sonographic appearance of the kidneys - UA neg - No signs of labor  Right CVAT with urinary frequency - Renal US - Normal sonographic appearance of the kidneys - UA neg - Recent treatment of Rocephin 06/25/19 and 06/26/19 IM  Vaginal itching  - HD treated her for a yeast infection today  Fetal Wellbeing - NST is Reassuring Cat 1 tracing.  Labor - not present   [place] 06/28/2019, 7:06 PM

## 2019-06-28 NOTE — Consult Note (Addendum)
Cephas Darby, MD [address]  [place]  [place], [place] [address]  Main: [phone]  Fax: [phone] Pager: [phone]   Consultation  Referring Provider:     No ref. provider found Primary Care Physician:  Department, [facility] Primary Gastroenterologist: Althia Forts         Reason for Consultation: Right upper quadrant, nausea and vomiting  Date of Admission:  06/27/2019 Date of Consultation:  06/28/2019         HPI:   Pamela Cole is a 23 y.o. female at 75 weeks of gestation admitted with 1 week history of right upper quadrant pain, describes as sharp in nature, that radiates to epigastric region associated with nausea and vomiting.  She reports her pain is worse after eating.  She reports that she has been throwing up every time she tries to eat.  Intensity of pain at rest is 2/10 and worse when she lays on the right side.  She denies having similar episodes in the past or in her previous pregnancies.  Since admission, she was found to have elevated alkaline phosphatase 153 on 11/18, repeat 136 today.  Normal total bilirubin.  She does have chronic severe iron deficiency anemia, no leukocytosis.  She denies fever, chills.  She is on Zofran, Tylenol and IV fluids.  She underwent right upper quadrant ultrasound today which revealed fatty liver, gallbladder polyps, no signs of cholecystitis, CBD 3.9 mm in diameter.  GI is consulted for further evaluation  She does not smoke tobacco or drink ETOH  NSAIDs: none  Antiplts/Anticoagulants/Anti thrombotics: none  GI Procedures: none  Past Medical History:  Diagnosis Date   Anemia    Cholelithiasis    UTI (urinary tract infection)    Hx of UTI x 1    Past Surgical History:  Procedure Laterality Date   NO PAST SURGERIES     TOOTH EXTRACTION Left 01/07/2019    Prior to Admission medications   Medication Sig Start Date End Date Taking? Authorizing Provider  acetaminophen  (TYLENOL) 500 MG tablet Take 1,000 mg by mouth every 6 (six) hours as needed.    [provider]  clotrimazole (CLOTRIMAZOLE-7) 1 % vaginal cream Place 1 Applicatorful vaginally at bedtime for 7 days. 06/27/19 07/04/19  Caren Macadam, MD  ferrous sulfate 325 (65 FE) MG tablet Take 325 mg by mouth 3 (three) times daily.    [provider]  Prenatal Vit-Fe Fumarate-FA (MULTIVITAMIN-PRENATAL) 27-0.8 MG TABS tablet Take 1 tablet by mouth daily at 12 noon.    [provider]    Current Facility-Administered Medications:    acetaminophen (TYLENOL) tablet 650 mg, 650 mg, Oral, Q4H PRN, Linda Hedges, CNM, 650 mg at 06/28/19 0113   alum & mag hydroxide-simeth (MAALOX/MYLANTA) 200-200-20 MG/5ML suspension 30 mL, 30 mL, Oral, Once **AND** lidocaine (XYLOCAINE) 2 % viscous mouth solution 15 mL, 15 mL, Oral, Once, Benjaman Kindler, MD   betamethasone acetate-betamethasone sodium phosphate (CELESTONE) injection 12 mg, 12 mg, Intramuscular, Q24H, Oxley, Jennifer, CNM, 12 mg at 06/27/19 2012   dextrose 5 % in lactated ringers infusion, , Intravenous, Continuous, Linda Hedges, CNM, Last Rate: 125 mL/hr at 06/28/19 0500   dicyclomine (BENTYL) 10 MG/5ML solution 10 mg, 10 mg, Oral, Once, Benjaman Kindler, MD   docusate sodium (COLACE) capsule 100 mg, 100 mg, Oral, Daily, Oxley, Jennifer, CNM   ondansetron (ZOFRAN-ODT) disintegrating tablet 4 mg, 4 mg, Oral, Q6H PRN **OR** ondansetron (ZOFRAN) injection 4 mg, 4 mg,  Intravenous, Q6H PRN, Linda Hedges, CNM, 4 mg at 06/28/19 3382   oxyCODONE (Oxy IR/ROXICODONE) immediate release tablet 5 mg, 5 mg, Oral, Q6H PRN, Linda Hedges, CNM, 5 mg at 06/28/19 1201   pantoprazole (PROTONIX) injection 40 mg, 40 mg, Intravenous, Q12H, Benjaman Kindler, MD   prenatal multivitamin tablet 1 tablet, 1 tablet, Oral, Q1200, Linda Hedges, CNM   Family History  Problem Relation Age of Onset   Hypertension Mother    Asthma  Brother    Breast cancer Maternal Grandmother      Social History   Tobacco Use   Smoking status: Never Smoker   Smokeless tobacco: Never Used  Substance Use Topics   Alcohol use: No   Drug use: No    Allergies as of 06/27/2019   (No Known Allergies)    Review of Systems:    All systems reviewed and negative except where noted in HPI.   Physical Exam:  Vital signs in last 24 hours: Temp:  [98 F (36.7 C)-98.8 F (37.1 C)] 98.6 F (37 C) (11/21 1603) Pulse Rate:  [73-82] 78 (11/21 1603) Resp:  [17-19] 18 (11/21 1603) BP: (97-111)/(62-73) 97/63 (11/21 1603) SpO2:  [98 %-100 %] 98 % (11/21 0806)   General:   Pleasant, cooperative in NAD Head:  Normocephalic and atraumatic. Eyes:   No icterus.   Conjunctiva pink. PERRLA. Ears:  Normal auditory acuity. Neck:  Supple; no masses or thyroidomegaly Lungs: Respirations even and unlabored. Lungs clear to auscultation bilaterally.   No wheezes, crackles, or rhonchi.  Heart:  Regular rate and rhythm;  Without murmur, clicks, rubs or gallops Abdomen:  Soft, right upper quadrant tenderness, distended from pregnancy. Normal bowel sounds. No appreciable masses or hepatomegaly.  No rebound or guarding.  Rectal:  Not performed. Msk:  Symmetrical without gross deformities.  Strength normal Extremities:  Without edema, cyanosis or clubbing. Neurologic:  Alert and oriented x3;  grossly normal neurologically. Skin:  Intact without significant lesions or rashes. Psych:  Alert and cooperative. Normal affect.  LAB RESULTS: CBC Latest Ref Rng & Units 06/28/2019 06/27/2019 06/25/2019  WBC 4.0 - 10.5 K/uL 8.4 8.8 7.9  Hemoglobin 12.0 - 15.0 g/dL 9.9(L) 10.6(L) 10.8(L)  Hematocrit 36.0 - 46.0 % 33.4(L) 34.0(L) 35.5  Platelets 150 - 400 K/uL 220 237 266    BMET BMP Latest Ref Rng & Units 06/28/2019 06/25/2019 06/11/2019  Glucose 70 - 99 mg/dL 118(H) 73 103(H)  BUN 6 - 20 mg/dL <5(L) 4(L) <5(L)  Creatinine 0.44 - 1.00 mg/dL <0.30(L)  0.38(L) 0.38(L)  BUN/Creat Ratio 9 - 23 - 11 -  Sodium 135 - 145 mmol/L 135 137 135  Potassium 3.5 - 5.1 mmol/L 3.6 3.6 3.3(L)  Chloride 98 - 111 mmol/L 106 100 104  CO2 22 - 32 mmol/L 20(L) 20 19(L)  Calcium 8.9 - 10.3 mg/dL 8.6(L) 8.9 8.5(L)    LFT Hepatic Function Latest Ref Rng & Units 06/28/2019 06/25/2019 06/11/2019  Total Protein 6.5 - 8.1 g/dL 6.3(L) 6.4 6.7  Albumin 3.5 - 5.0 g/dL 2.9(L) 3.7(L) 3.2(L)  AST 15 - 41 U/L 17 15 36  ALT 0 - 44 U/L '15 15 15  ' Alk Phosphatase 38 - 126 U/L 136(H) 153(H) 105  Total Bilirubin 0.3 - 1.2 mg/dL 0.5 0.3 0.6  Bilirubin, Direct 0.0 - 0.2 mg/dL - - <0.1     STUDIES: US Renal  Result Date: 06/27/2019 CLINICAL DATA:  Patient is approximately [redacted] weeks pregnant. Right upper quadrant and right flank pain. Suspect pyelonephritis versus  kidney stones. EXAM: RENAL / URINARY TRACT ULTRASOUND COMPLETE COMPARISON:  None. FINDINGS: Right Kidney: Renal measurements: 11.3 x 5.6 x 5.6 cm = volume: 184 mL . Echogenicity within normal limits. No mass or hydronephrosis visualized. Left Kidney: Renal measurements: 12.6 x 6.1 x 7.3 cm = volume: 291 mL. Echogenicity within normal limits. No mass or hydronephrosis visualized. Bladder: Decompressed and not well evaluated. Other: None. IMPRESSION: 1. Normal sonographic appearance of the kidneys. Electronically Signed   By: Lajean Manes M.D.   On: 06/27/2019 14:36   US Abdomen Limited Ruq  Result Date: 06/28/2019 CLINICAL DATA:  Right upper quadrant abdominal pain EXAM: ULTRASOUND ABDOMEN LIMITED RIGHT UPPER QUADRANT COMPARISON:  06/11/2019 FINDINGS: Gallbladder: Small subcentimeter echogenic nonshadowing foci along the gallbladder wall compatible with polyps. Normal wall thickness measures 1.7 mm. No definite gallstones. No Murphy's sign or pericholecystic fluid. No signs of cholecystitis. Common bile duct: Diameter: 3.9 mm Liver: Increased hepatic echogenicity compatible with background hepatic steatosis. Small area  of fatty sparing along the gallbladder noted. No intrahepatic biliary dilatation or definite focal hepatic abnormality. Portal vein is patent on color Doppler imaging with normal direction of blood flow towards the liver. Other: Included imaging of the right kidney upper pole demonstrates distension of the collecting system compatible with pelviectasis versus mild hydronephrosis. This appears new compared to 06/11/2019. IMPRESSION: Incidental gallbladder polyps. Negative for gallstones or cholecystitis. No biliary dilatation or obstruction Hepatic steatosis Mild right kidney pelviectasis versus developing hydronephrosis. Electronically Signed   By: Jerilynn Mages.  Shick M.D.   On: 06/28/2019 10:14      Impression / Plan:   Pamela Cole is a 23 y.o. female with no significant past medical history G3P2, 36 weeks of gestation admitted with 1 week history of right upper quadrant pain radiating to the epigastric region associated with nausea and vomiting.  She does have isolated elevated alkaline phosphatase on 2 separate occasions.  Right upper quadrant ultrasound revealed normal CBD, no evidence of cholelithiasis.  Differentials include biliary colic or gastritis or choledocholithiasis  Recommend MRCP to evaluate for any choledocholithiasis, if positive, she will need ERCP Check serum lipase Check H. pylori IgG and H. pylori stool antigen and treat if positive for respiratory infection Continue IV fluids, pain management and antiemetics Start Protonix 40 mg IV twice daily Monitor LFTs daily N.p.o. except meds and ice chips  Thank you for involving me in the care of this patient.  We will follow along with you    LOS: 1 day   Sherri Sear, MD  06/28/2019, 5:51 PM   Note: This dictation was prepared with Dragon dictation along with smaller phrase technology. Any transcriptional errors that result from this process are unintentional.

## 2019-06-29 MED ORDER — POLYETHYLENE GLYCOL 3350 17 G PO PACK
17.0000 g | PACK | Freq: Two times a day (BID) | ORAL | Status: DC
  Administered 2019-06-29 – 2019-06-30 (×2): 17 g via ORAL
  Filled 2019-06-29 (×3): qty 1

## 2019-06-29 MED ORDER — PANTOPRAZOLE SODIUM 40 MG PO TBEC
40.0000 mg | DELAYED_RELEASE_TABLET | Freq: Two times a day (BID) | ORAL | Status: DC
  Administered 2019-06-29 – 2019-06-30 (×2): 40 mg via ORAL
  Filled 2019-06-29 (×2): qty 1

## 2019-06-29 MED ORDER — DOCUSATE SODIUM 100 MG PO CAPS
100.0000 mg | ORAL_CAPSULE | Freq: Two times a day (BID) | ORAL | Status: DC
  Administered 2019-06-29 – 2019-06-30 (×2): 100 mg via ORAL
  Filled 2019-06-29 (×2): qty 1

## 2019-06-29 MED ORDER — DEXTROSE IN LACTATED RINGERS 5 % IV SOLN
Freq: Once | INTRAVENOUS | Status: AC
  Administered 2019-06-29: 500 mL via INTRAVENOUS

## 2019-06-29 NOTE — OB Triage Note (Signed)
Daily NST 

## 2019-06-29 NOTE — Progress Notes (Signed)
ANTEPARTUM PROGRESS NOTE  Pamela Cole is a 23 y.o. G3P2002 at [redacted]w[redacted]d who is admitted for who is admitted for RUQ/right flank and epigastric pain x1 week.  S/p ancef and rocephin for presumed pyleo; however, no leukocytosis or fever at this time. No blood in urine, no dysuria.    Estimated Date of Delivery: 07/26/19  Length of Stay:  2 Days. Admitted 06/27/2019  Subjective: RUQ pain Today she reports nausea when she eats but is able to keep the clear liquid diet down.  Significant RUQ/flank/epigastric pain has resolved but she still reports sensations of burning in the RUQ and epigastric areas. Pt seems in better spirits today, asking for a [place] sandwich.  Patient reports good fetal movement.  She reports no uterine contractions, no bleeding and no loss of fluid per vagina.  However, she continues to endorses vaginal pressure, suprapubic tenderness.  NST reactive daily   Vitals:  BP 101/64 (BP Location: Right Arm)   Pulse 67   Temp 98.6 F (37 C) (Oral)   Resp 18   Ht 4\' 11"  (1.499 m)   Wt 70.3 kg   LMP  (LMP Unknown)   SpO2 100% Comment: Room Air  BMI 31.31 kg/m   Physical Examination: General:   alert, cooperative and less distress  Skin:  normal  Neurologic:    Alert & oriented x 3  Lungs:   normal effort  Heart:   regular rate and rhythm  Abdomen:  gavid, soft, mild tenderness to palpation in RUQ  Pelvis:  Exam deferred.  Extremities: : non-tender, no edema bilaterally.     NST preformed 06/28/2019 at 1758 reported: Baseline: 135 Moderate Variability 15x15 accelerations noted No decelerations Occasional UCs  Results for orders placed or performed during the hospital encounter of 06/27/19 (from the past 48 hour(s))  Urinalysis, Complete w Microscopic     Status: Abnormal   Collection Time: 06/27/19  6:14 PM  Result Value Ref Range   Color, Urine YELLOW (A) YELLOW   APPearance CLEAR (A) CLEAR   Specific Gravity, Urine 1.012 1.005 - 1.030   pH  7.0 5.0 - 8.0   Glucose, UA NEGATIVE NEGATIVE mg/dL   Hgb urine dipstick NEGATIVE NEGATIVE   Bilirubin Urine NEGATIVE NEGATIVE   Ketones, ur 5 (A) NEGATIVE mg/dL   Protein, ur NEGATIVE NEGATIVE mg/dL   Nitrite NEGATIVE NEGATIVE   Leukocytes,Ua NEGATIVE NEGATIVE   RBC / HPF 0-5 0 - 5 RBC/hpf   WBC, UA 0-5 0 - 5 WBC/hpf   Bacteria, UA NONE SEEN NONE SEEN   Squamous Epithelial / LPF 0-5 0 - 5   Mucus PRESENT     Comment: Performed at [facility], [address] [place], [place]  CBC     Status: Abnormal   Collection Time: 06/28/19  6:08 AM  Result Value Ref Range   WBC 8.4 4.0 - 10.5 K/uL   RBC 4.57 3.87 - 5.11 MIL/uL   Hemoglobin 9.9 (L) 12.0 - 15.0 g/dL   HCT 06/30/19 (L) 02.5 - 85.2 %   MCV 73.1 (L) 80.0 - 100.0 fL   MCH 21.7 (L) 26.0 - 34.0 pg   MCHC 29.6 (L) 30.0 - 36.0 g/dL   RDW 77.8 (H) 24.2 - 35.3 %   Platelets 220 150 - 400 K/uL   nRBC 0.0 0.0 - 0.2 %    Comment: Performed at [facility], [address], [place], [place]  Comprehensive metabolic panel  Status: Abnormal   Collection Time: 06/28/19  6:08 AM  Result Value Ref Range   Sodium 135 135 - 145 mmol/L   Potassium 3.6 3.5 - 5.1 mmol/L   Chloride 106 98 - 111 mmol/L   CO2 20 (L) 22 - 32 mmol/L   Glucose, Bld 118 (H) 70 - 99 mg/dL   BUN <5 (L) 6 - 20 mg/dL   Creatinine, Ser <0.30 (L) 0.44 - 1.00 mg/dL   Calcium 8.6 (L) 8.9 - 10.3 mg/dL   Total Protein 6.3 (L) 6.5 - 8.1 g/dL   Albumin 2.9 (L) 3.5 - 5.0 g/dL   AST 17 15 - 41 U/L   ALT 15 0 - 44 U/L   Alkaline Phosphatase 136 (H) 38 - 126 U/L   Total Bilirubin 0.5 0.3 - 1.2 mg/dL   GFR calc non Af Amer NOT CALCULATED >60 mL/min   GFR calc Af Amer NOT CALCULATED >60 mL/min   Anion gap 9 5 - 15    Comment: Performed at [facility], [address] [place], [place] [address]  Lipase, blood     Status: None   Collection Time: 06/28/19  6:15 PM  Result Value Ref Range   Lipase 22 11 - 51 U/L    Comment:  Performed at [facility], [place], [place] [address]    Mr Abdomen Mrcp Wo Contrast  Result Date: 06/28/2019 CLINICAL DATA:  23 year old pregnant female inpatient at [redacted] weeks gestational age admitted with 1 week of right upper quadrant abdominal pain, nausea and vomiting. Abnormal liver function tests. EXAM: MRI ABDOMEN WITHOUT CONTRAST  (INCLUDING MRCP) TECHNIQUE: Multiplanar multisequence MR imaging of the abdomen was performed. Heavily T2-weighted images of the biliary and pancreatic ducts were obtained, and three-dimensional MRCP images were rendered by post processing. COMPARISON:  Right upper quadrant abdominal sonogram from earlier today. 03/31/2017 CT abdomen/pelvis. FINDINGS: Lower chest: No acute abnormality at the lung bases. Hepatobiliary: Normal liver size and configuration. Mild diffuse hepatic steatosis. No liver mass. No cholelithiasis. No gallbladder wall thickening. No pericholecystic fluid. Tiny 3 mm inferior gallbladder wall polyp as seen on sonogram from earlier today. No biliary ductal dilatation. Common bile duct diameter 3 mm. No evidence of choledocholithiasis. No biliary strictures or beading. Pancreas: No pancreatic mass or duct dilation.  No pancreas divisum. Spleen: Normal size. No mass. Adrenals/Urinary Tract: Normal adrenals. Mild bilateral pelvocaliectasis, left greater than right. Normal size kidneys. No renal masses. No perinephric edema. Stomach/Bowel: Normal non-distended stomach. Visualized small and large bowel is normal caliber, with no bowel wall thickening. Vascular/Lymphatic: Normal caliber abdominal aorta. No pathologically enlarged lymph nodes in the abdomen. Other: No abdominal ascites or focal fluid collection. Partially visualized enlarged gravid uterus with single visualized intrauterine gestation. This MRI study not tailored for fetal evaluation. Musculoskeletal: No aggressive appearing focal osseous lesions. IMPRESSION: 1. Mild  diffuse hepatic steatosis.  No liver masses. 2. No cholelithiasis.  No evidence of acute cholecystitis. 3. No biliary ductal dilatation. No evidence of choledocholithiasis. 4. Tiny 3 mm gallbladder polyp, as seen on sonogram from earlier today, compatible with benign cholesterol polyp, requiring no follow-up. 5. Mild bilateral pelvicaliectasis, left greater than right, more likely physiologic due to advanced gestational age given absence of perinephric edema. Electronically Signed   By: Ilona Sorrel M.D.   On: 06/28/2019 20:17   US Abdomen Limited Ruq  Result Date: 06/28/2019 CLINICAL DATA:  Right upper quadrant abdominal pain EXAM: ULTRASOUND ABDOMEN LIMITED RIGHT UPPER QUADRANT COMPARISON:  06/11/2019 FINDINGS: Gallbladder: Small subcentimeter echogenic nonshadowing foci along the gallbladder wall compatible with polyps. Normal wall thickness measures 1.7 mm. No definite gallstones. No Murphy's sign or pericholecystic fluid. No signs of cholecystitis. Common bile duct: Diameter: 3.9 mm Liver: Increased hepatic echogenicity compatible with background hepatic steatosis. Small area of fatty sparing along the gallbladder noted. No intrahepatic biliary dilatation or definite focal hepatic abnormality. Portal vein is patent on color Doppler imaging with normal direction of blood flow towards the liver. Other: Included imaging of the right kidney upper pole demonstrates distension of the collecting system compatible with pelviectasis versus mild hydronephrosis. This appears new compared to 06/11/2019. IMPRESSION: Incidental gallbladder polyps. Negative for gallstones or cholecystitis. No biliary dilatation or obstruction Hepatic steatosis Mild right kidney pelviectasis versus developing hydronephrosis. Electronically Signed   By: Judie PetitM.  Shick M.D.   On: 06/28/2019 10:14   Gastroenterology Consult Recommendations: Recommend MRCP to evaluate for any choledocholithiasis, if positive, she will need ERCP Check serum  lipase Check H. pylori IgG and H. pylori stool antigen and treat if positive for respiratory infection Continue IV fluids, pain management and antiemetics Start Protonix 40 mg IV twice daily Monitor LFTs daily N.p.o. except meds and ice chips  Current scheduled medications . docusate sodium  100 mg Oral Daily  . lidocaine  15 mL Oral Once  . pantoprazole (PROTONIX) IV  40 mg Intravenous Q12H  . prenatal multivitamin  1 tablet Oral Q1200    I have reviewed the patient's current medications.  ASSESSMENT: Patient Active Problem List   Diagnosis Date Noted  . Abdominal pain affecting pregnancy, antepartum 06/27/2019  . Right upper quadrant abdominal pain 06/27/2019  . (Suspected) Pyelonephritis affecting pregnancy in third trimester 06/25/2019  . History of abnormal cervical Pap smear 06/25/2019  . Indication for care in labor and delivery, antepartum 06/15/2019  . Abdominal cramping affecting pregnancy 05/27/2019  . At risk for abnormal blood glucose level 05/19/2019  . Vaginal bleeding in pregnancy, second trimester 04/24/2019  . IUGR (intrauterine growth retardation) in prior pregnancy, pregnant 02/18/2019  . Abnormal cervical cytology 01/30/2019  . Supervision of other normal pregnancy, antepartum 01/30/2019  . Supervision of high risk pregnancy in third trimester 06/01/2016  . Anemia affecting pregnancy in third trimester 04/17/2016    PLAN: 1. RUQ Pain - continue with GI consult's recommendations  ERCP Check serum lipase - 22 (06/28/19) Monitor LFTs daily - AST 17, ALT 15 (06/28/19)  AST 15 ALT 15 (06/25/19) Check H. pylori IgG - pending H. pylori stool antigen - pt unable to provide sample at this time Treat H. pylori if positive  Continue IV fluids, pain management and antiemetics Start Protonix 40 mg IV twice daily Clear liquid diet  2. Fetal status: - s/p BMZ - continue daily NSTs  Activity encouraged as tolerated Continue routine antenatal care.  Cyril MourningJenifer  E Lyncoln Maskell, CNM 06/29/2019 2:50 PM

## 2019-06-29 NOTE — Progress Notes (Signed)
Requested sandwich tray at 2000. Called Dr. Marius Ditch and she ordered clear liquid diet. Gave pt all clear liquid options available on floor and pt tolerated well.   No nausea/vomitting throughout shift.  Taking PO Tylenol. Refuses any stronger pain meds.   Pt sleeping from 0020-0400.   Woke up from sleeping at 0400 and called out to nurses station. Stated she woke up with what felt like fluid between her legs. Checked pt and found a quarter-sized spot of yellow discharge. Pt stated abdominal cramping 5/10. Nurse asked pt if felt like contraction pain, pt stated absolutely not. Nurse encouraged pt void at this time and she voided yellow, clear urine 232mL with same foul smell.   Pt still unable to stool. Explained to pt to save a sample in the hat.  Nurse explained to call out for any more discharge or worsening pain or different symptoms.   Ancef d/c by Dr. Ouida Sills yesterday evening. Pt still getting D5LR at 121mL/hr.   Pt refuses to take the prenetal vitamin. States the pills make her vomit. She takes the gummies at home. She does not have those with her at the hospital.   VSS. Will continue to monitor.

## 2019-06-29 NOTE — Progress Notes (Signed)
ANTEPARTUM PROGRESS NOTE  Pamela Cole is a 23 y.o. G3P2002 at [redacted]w[redacted]d who is admitted for RUQ pain.   Estimated Date of Delivery: 07/26/19  Length of Stay:  2 Days. Admitted 06/27/2019  Subjective: Today signification pain has resolved, she is only complaining of a burning sensation in the RUQ/epigastric area now.  Nausea is reduced and no vomiting.  New complaint of regular abdominal tightening.  She reports:  -active fetal movement -no leakage of fluid -no vaginal bleeding -no contractions  Vitals:  BP 106/60 (BP Location: Right Arm)   Pulse 81   Temp 98 F (36.7 C) (Oral)   Resp 12   Ht 4\' 11"  (1.499 m)   Wt 70.3 kg   LMP  (LMP Unknown)   SpO2 100% Comment: Room Air  BMI 31.31 kg/m  Physical Examination: General:   alert, cooperative and no distress  Skin:  normal  Neurologic:    Alert & oriented x 3  Abdomen:  Gravid  Cervix:    Dilation: Closed   Effacement: Long   Station:  -3   Consistency: firm   Position: posterior  Extremities: : non-tender, symmetric, no edema bilaterally.      Fetal monitoring: FHR: 140 bpm, Variability: moderate, Accelerations: Present, Decelerations: Absent, Presentation: Vtx Uterine activity: a period of regular UCs around q60min eventually spaced out and stopped.  Current scheduled medications . docusate sodium  100 mg Oral BID  . pantoprazole  40 mg Oral BID  . polyethylene glycol  17 g Oral BID  . prenatal multivitamin  1 tablet Oral Q1200   I have reviewed the patient's current medications.  ASSESSMENT: Patient Active Problem List   Diagnosis Date Noted  . Abdominal pain affecting pregnancy, antepartum 06/27/2019  . Right upper quadrant abdominal pain 06/27/2019  . (Suspected) Pyelonephritis affecting pregnancy in third trimester 06/25/2019  . History of abnormal cervical Pap smear 06/25/2019  . Indication for care in labor and delivery, antepartum 06/15/2019  . Abdominal cramping affecting pregnancy 05/27/2019   . At risk for abnormal blood glucose level 05/19/2019  . Vaginal bleeding in pregnancy, second trimester 04/24/2019  . IUGR (intrauterine growth retardation) in prior pregnancy, pregnant 02/18/2019  . Abnormal cervical cytology 01/30/2019  . Supervision of other normal pregnancy, antepartum 01/30/2019  . Supervision of high risk pregnancy in third trimester 06/01/2016  . Anemia affecting pregnancy in third trimester 04/17/2016    PLAN: 1. RUQ Pain - Continue with GI consult's recommendations  - Changed Protonix 40 mg IV BID to Protonix 40mg  PO BID - Advanced Diet to regular - ate a sandwich, well tolerated  - Saline locked IV ERCP Check serum lipase - 22 (06/28/19) Monitor LFTs daily - AST 17, ALT 15 (06/28/19)  AST 15 ALT 15 (06/25/19) Check H. pylori IgG - pending H. pylori stool antigen - pt unable to provide sample at this time Treat H. pylori if positive  Continue pain management and antiemetics  2. Labor - Short episodes of regular UCs - SVE: closed, long - Continue to monitor for labor  3. Fetal status: - s/p BMZ - continue daily NSTs  Activity encouraged as tolerated Continue routine antenatal care.  Clydene Laming, CNM 06/29/2019 7:54 PM

## 2019-06-30 LAB — H PYLORI, IGM, IGG, IGA AB
H Pylori IgG: 3.63 Index Value — ABNORMAL HIGH (ref 0.00–0.79)
H. Pylogi, Iga Abs: 27 units — ABNORMAL HIGH (ref 0.0–8.9)
H. Pylogi, Igm Abs: <9 units (ref 0.0–8.9)

## 2019-06-30 MED ORDER — DOCUSATE SODIUM 100 MG PO CAPS: 100.0000 mg | ORAL_CAPSULE | Freq: Two times a day (BID) | ORAL | 0 refills | Status: DC

## 2019-06-30 MED ORDER — PANTOPRAZOLE SODIUM 40 MG PO TBEC: 40.0000 mg | DELAYED_RELEASE_TABLET | Freq: Two times a day (BID) | ORAL | 1 refills | Status: DC

## 2019-06-30 MED ORDER — HYDROCODONE-ACETAMINOPHEN 5-325 MG PO TABS: ORAL_TABLET | ORAL | 0 refills | Status: DC

## 2019-06-30 MED ORDER — ONDANSETRON 4 MG PO TBDP: 4.0000 mg | ORAL_TABLET | Freq: Four times a day (QID) | ORAL | 0 refills | Status: DC | PRN

## 2019-06-30 NOTE — Progress Notes (Signed)
Subjective: Patient reports pt is here for abdominal pain RUQ / flank  HD #4 .  Work up has not yielde etiology . MRCP done per GI yesterday . H pyleori pending  On protonix .  Pain  has improved . Some CTX , not appreciated on NST this am . cx check last pm closed . Marland Kitchen    Objective: I have reviewed patient's vital signs.  General: alert and cooperative Cardio: regular rate and rhythm, S1, S2 normal, no murmur, click, rub or gallop GI: soft, non-tender; bowel sounds normal; no masses,  no organomegaly   Assessment/Plan: Upper adb pain improved . Etiology uncertain . Reassuring fetal monitoring  D/c home with [facility]D follow up .   LOS: 3 days    Gwen Her Vicky Schleich 06/30/2019, 10:40 AM

## 2019-06-30 NOTE — Progress Notes (Signed)
Arlyss Repressohini R Anastasios Melander, MD [address]  [address]  [place], [place] [address]  Main: [phone]  Fax: [phone] Pager: [phone]   Subjective: Patient was doing significantly better today.  She is no longer experiencing nausea or vomiting or abdominal pain.  She is tolerating clear liquids well.   Objective: Vital signs in last 24 hours: Vitals:   06/29/19 1748 06/29/19 2328 06/29/19 2328 06/30/19 0759  BP: 106/60 106/62 106/62 96/64  Pulse: 81 86 86 66  Resp: 12 20 20 18   Temp: 98 F (36.7 C) 98.4 F (36.9 C) 98.4 F (36.9 C) 98.8 F (37.1 C)  TempSrc: Oral Oral Oral Oral  SpO2:  98% 98% 98%  Weight:      Height:       Weight change:   Intake/Output Summary (Last 24 hours) at 06/30/2019 1145 Last data filed at 06/30/2019 0600 Gross per 24 hour  Intake 1271.07 ml  Output 700 ml  Net 571.07 ml     Exam: Heart:: Regular rate and rhythm, S1S2 present or without murmur or extra heart sounds Lungs: normal and clear to auscultation Abdomen: soft, nontender, normal bowel sounds   Lab Results: CBC Latest Ref Rng & Units 06/28/2019 06/27/2019 06/25/2019  WBC 4.0 - 10.5 K/uL 8.4 8.8 7.9  Hemoglobin 12.0 - 15.0 g/dL 6.4(P9.9(L) 10.6(L) 10.8(L)  Hematocrit 36.0 - 46.0 % 33.4(L) 34.0(L) 35.5  Platelets 150 - 400 K/uL 220 237 266   CMP Latest Ref Rng & Units 06/28/2019 06/25/2019 06/11/2019  Glucose 70 - 99 mg/dL 329(J118(H) 73 188(C103(H)  BUN 6 - 20 mg/dL <1(Y<5(L) 4(L) <6(A<5(L)  Creatinine 0.44 - 1.00 mg/dL <6.30(Z<0.30(L) 6.01(U0.38(L) 9.32(T0.38(L)  Sodium 135 - 145 mmol/L 135 137 135  Potassium 3.5 - 5.1 mmol/L 3.6 3.6 3.3(L)  Chloride 98 - 111 mmol/L 106 100 104  CO2 22 - 32 mmol/L 20(L) 20 19(L)  Calcium 8.9 - 10.3 mg/dL 5.5(D8.6(L) 8.9 3.2(K8.5(L)  Total Protein 6.5 - 8.1 g/dL 6.3(L) 6.4 6.7  Total Bilirubin 0.3 - 1.2 mg/dL 0.5 0.3 0.6  Alkaline Phos 38 - 126 U/L 136(H) 153(H) 105  AST 15 - 41 U/L 17 15 36  ALT 0 - 44 U/L 15 15 15     Micro Results: Recent Results (from the past 240 hour(s))    Urine Culture & Sensitivity     Status: None   Collection Time: 06/25/19  9:45 AM   Specimen: Urine   UR  Result Value Ref Range Status   Urine Culture, Routine Final report  Final   Organism ID, Bacteria Comment  Final    Comment: Mixed urogenital flora 25,000-50,000 colony forming units per mL   WET PREP FOR TRICH, YEAST, CLUE     Status: None   Collection Time: 06/27/19 11:41 AM  Result Value Ref Range Status   Trichomonas Exam Negative Negative Final   Yeast Exam MOD Negative Final   Clue Cell Exam Comment: Negative Final    Comment: NEG;AMINE NEG   Studies/Results: Mr Abdomen Mrcp Wo Contrast  Result Date: 06/28/2019 CLINICAL DATA:  23 year old pregnant female inpatient at 5636 weeks gestational age admitted with 1 week of right upper quadrant abdominal pain, nausea and vomiting. Abnormal liver function tests. EXAM: MRI ABDOMEN WITHOUT CONTRAST  (INCLUDING MRCP) TECHNIQUE: Multiplanar multisequence MR imaging of the abdomen was performed. Heavily T2-weighted images of the biliary and pancreatic ducts were obtained, and three-dimensional MRCP images were rendered by post processing. COMPARISON:  Right upper quadrant abdominal sonogram from earlier today.  03/31/2017 CT abdomen/pelvis. FINDINGS: Lower chest: No acute abnormality at the lung bases. Hepatobiliary: Normal liver size and configuration. Mild diffuse hepatic steatosis. No liver mass. No cholelithiasis. No gallbladder wall thickening. No pericholecystic fluid. Tiny 3 mm inferior gallbladder wall polyp as seen on sonogram from earlier today. No biliary ductal dilatation. Common bile duct diameter 3 mm. No evidence of choledocholithiasis. No biliary strictures or beading. Pancreas: No pancreatic mass or duct dilation.  No pancreas divisum. Spleen: Normal size. No mass. Adrenals/Urinary Tract: Normal adrenals. Mild bilateral pelvocaliectasis, left greater than right. Normal size kidneys. No renal masses. No perinephric edema.  Stomach/Bowel: Normal non-distended stomach. Visualized small and large bowel is normal caliber, with no bowel wall thickening. Vascular/Lymphatic: Normal caliber abdominal aorta. No pathologically enlarged lymph nodes in the abdomen. Other: No abdominal ascites or focal fluid collection. Partially visualized enlarged gravid uterus with single visualized intrauterine gestation. This MRI study not tailored for fetal evaluation. Musculoskeletal: No aggressive appearing focal osseous lesions. IMPRESSION: 1. Mild diffuse hepatic steatosis.  No liver masses. 2. No cholelithiasis.  No evidence of acute cholecystitis. 3. No biliary ductal dilatation. No evidence of choledocholithiasis. 4. Tiny 3 mm gallbladder polyp, as seen on sonogram from earlier today, compatible with benign cholesterol polyp, requiring no follow-up. 5. Mild bilateral pelvicaliectasis, left greater than right, more likely physiologic due to advanced gestational age given absence of perinephric edema. Electronically Signed   By: Delbert Phenix M.D.   On: 06/28/2019 20:17   Mr 3d Recon At Scanner  Result Date: 06/28/2019 CLINICAL DATA:  23 year old pregnant female inpatient at [redacted] weeks gestational age admitted with 1 week of right upper quadrant abdominal pain, nausea and vomiting. Abnormal liver function tests. EXAM: MRI ABDOMEN WITHOUT CONTRAST  (INCLUDING MRCP) TECHNIQUE: Multiplanar multisequence MR imaging of the abdomen was performed. Heavily T2-weighted images of the biliary and pancreatic ducts were obtained, and three-dimensional MRCP images were rendered by post processing. COMPARISON:  Right upper quadrant abdominal sonogram from earlier today. 03/31/2017 CT abdomen/pelvis. FINDINGS: Lower chest: No acute abnormality at the lung bases. Hepatobiliary: Normal liver size and configuration. Mild diffuse hepatic steatosis. No liver mass. No cholelithiasis. No gallbladder wall thickening. No pericholecystic fluid. Tiny 3 mm inferior gallbladder  wall polyp as seen on sonogram from earlier today. No biliary ductal dilatation. Common bile duct diameter 3 mm. No evidence of choledocholithiasis. No biliary strictures or beading. Pancreas: No pancreatic mass or duct dilation.  No pancreas divisum. Spleen: Normal size. No mass. Adrenals/Urinary Tract: Normal adrenals. Mild bilateral pelvocaliectasis, left greater than right. Normal size kidneys. No renal masses. No perinephric edema. Stomach/Bowel: Normal non-distended stomach. Visualized small and large bowel is normal caliber, with no bowel wall thickening. Vascular/Lymphatic: Normal caliber abdominal aorta. No pathologically enlarged lymph nodes in the abdomen. Other: No abdominal ascites or focal fluid collection. Partially visualized enlarged gravid uterus with single visualized intrauterine gestation. This MRI study not tailored for fetal evaluation. Musculoskeletal: No aggressive appearing focal osseous lesions. IMPRESSION: 1. Mild diffuse hepatic steatosis.  No liver masses. 2. No cholelithiasis.  No evidence of acute cholecystitis. 3. No biliary ductal dilatation. No evidence of choledocholithiasis. 4. Tiny 3 mm gallbladder polyp, as seen on sonogram from earlier today, compatible with benign cholesterol polyp, requiring no follow-up. 5. Mild bilateral pelvicaliectasis, left greater than right, more likely physiologic due to advanced gestational age given absence of perinephric edema. Electronically Signed   By: Delbert Phenix M.D.   On: 06/28/2019 20:17   Medications:  I have reviewed the  patient's current medications. Prior to Admission:  Medications Prior to Admission  Medication Sig Dispense Refill Last Dose   acetaminophen (TYLENOL) 500 MG tablet Take 1,000 mg by mouth every 6 (six) hours as needed.      clotrimazole (CLOTRIMAZOLE-7) 1 % vaginal cream Place 1 Applicatorful vaginally at bedtime for 7 days. 45 g 0    ferrous sulfate 325 (65 FE) MG tablet Take 325 mg by mouth 3 (three) times  daily.      Prenatal Vit-Fe Fumarate-FA (MULTIVITAMIN-PRENATAL) 27-0.8 MG TABS tablet Take 1 tablet by mouth daily at 12 noon.      Scheduled:  docusate sodium  100 mg Oral BID   pantoprazole  40 mg Oral BID   polyethylene glycol  17 g Oral BID   prenatal multivitamin  1 tablet Oral Q1200   Continuous:  dextrose 5% lactated ringers Stopped (06/29/19 1900)   ZOX:[id number], ondansetron **OR** ondansetron (ZOFRAN) IV, oxyCODONE Anti-infectives (From admission, onward)   Start     Dose/Rate Route Frequency Ordered Stop   06/28/19 0200  ceFAZolin (ANCEF) IVPB 1 g/50 mL premix  Status:  Discontinued     1 g 100 mL/hr over 30 Minutes Intravenous Every 6 hours 06/27/19 2058 06/28/19 1536   06/27/19 2030  ceFAZolin (ANCEF) IVPB 1 g/50 mL premix  Status:  Discontinued     1 g 100 mL/hr over 30 Minutes Intravenous Every 6 hours 06/27/19 2015 06/27/19 2058   06/27/19 2016  ceFAZolin (ANCEF) 1-4 GM/50ML-% IVPB    Note to Pharmacy: Marisa Sprinkles   : cabinet override      06/27/19 2016 06/28/19 0829     Scheduled Meds:  docusate sodium  100 mg Oral BID   pantoprazole  40 mg Oral BID   polyethylene glycol  17 g Oral BID   prenatal multivitamin  1 tablet Oral Q1200   Continuous Infusions:  dextrose 5% lactated ringers Stopped (06/29/19 1900)   PRN Meds:.acetaminophen, ondansetron **OR** ondansetron (ZOFRAN) IV, oxyCODONE   Assessment: Active Problems:   Abdominal pain affecting pregnancy, antepartum   Right upper quadrant abdominal pain  Right upper quadrant pain, nausea and vomiting resolved MRCP did not reveal any evidence of choledocholithiasis  Plan: Advance diet as tolerated No further work-up from GI standpoint Continue Protonix 40 mg 1-2 times daily before meals upon discharge Follow-up with GI after delivery GI will sign off at this time, please call us back with questions or concerns   LOS: 3 days   Zarinah Oviatt 06/30/2019, 11:45 AM

## 2019-06-30 NOTE — Progress Notes (Signed)
Pt c/o generalized abdominal tightness wrapping around her back- states it feels more intense than last night. Pt states she has been drinking water to stay hydrated but has not helped. Denied pain medication. Apple juice w/ scheduled miralax given with graham crackers and BP called to come get pt for daily NST. Pt updated on current plan.

## 2019-06-30 NOTE — Progress Notes (Signed)
Patient discharged home with mother. Discharge instructions and prescriptions given and reviewed with patient. Patient verbalized understanding. Escorted out by staff. 

## 2019-06-30 NOTE — Progress Notes (Signed)
Patient ID: Pamela Cole, female   DOB: June 13, 1996, 23 y.o.   MRN: [id number] Reactive nst on 06/30/2019 No decels , rare CTX

## 2019-06-30 NOTE — Discharge Summary (Signed)
Physician Discharge Summary  Patient ID: Pamela Cole MRN: [id number] DOB/AGE: 04/30/1996 23 y.o.  Admit date: 06/27/2019 Discharge date: 06/30/2019  Admission Diagnoses:35 +[redacted] weeks EGA with RUQ and right flank pain   Discharge Diagnoses:  Active Problems:   Abdominal pain affecting pregnancy, antepartum   Right upper quadrant abdominal pain   Discharged Condition: good  Hospital Course:pt was admitted and IVF started . Abd u/s + renal u/s + MRCP performed wihtout any significant findings . GI consultation done . Pt was started on  Protonix . On d/cpain was improving and she tolerated regular food without n/v .  She remained afebrile and her labs were unremarkable ( hpylori pending ) .  Betamethasone injections x2 given . NST s all reactive including day of discharge   Consults: GI  Significant Diagnostic Studies: labs:   Results for orders placed or performed during the hospital encounter of 06/27/19 (from the past 72 hour(s))  Urinalysis, Routine w reflex microscopic     Status: Abnormal   Collection Time: 06/27/19  1:26 PM  Result Value Ref Range   Color, Urine YELLOW (A) YELLOW   APPearance CLOUDY (A) CLEAR   Specific Gravity, Urine 1.020 1.005 - 1.030   pH 7.0 5.0 - 8.0   Glucose, UA NEGATIVE NEGATIVE mg/dL   Hgb urine dipstick NEGATIVE NEGATIVE   Bilirubin Urine NEGATIVE NEGATIVE   Ketones, ur NEGATIVE NEGATIVE mg/dL   Protein, ur 30 (A) NEGATIVE mg/dL   Nitrite NEGATIVE NEGATIVE   Leukocytes,Ua LARGE (A) NEGATIVE   RBC / HPF 21-50 0 - 5 RBC/hpf   WBC, UA 21-50 0 - 5 WBC/hpf   Bacteria, UA FEW (A) NONE SEEN   Squamous Epithelial / LPF 21-50 0 - 5   Mucus PRESENT     Comment: Performed at [facility], [address] [place], [place] [address]  Type and screen [place]     Status: None   Collection Time: 06/27/19  1:37 PM  Result Value Ref Range   ABO/RH(D) O POS    Antibody Screen NEG    Sample Expiration       06/30/2019,2359 Performed at [facility], [address] [place],  [address]   CBC with Differential/Platelet     Status: Abnormal   Collection Time: 06/27/19  1:43 PM  Result Value Ref Range   WBC 8.8 4.0 - 10.5 K/uL   RBC 4.80 3.87 - 5.11 MIL/uL   Hemoglobin 10.6 (L) 12.0 - 15.0 g/dL   HCT 34.0 (L) 36.0 - 46.0 %   MCV 70.8 (L) 80.0 - 100.0 fL   MCH 22.1 (L) 26.0 - 34.0 pg   MCHC 31.2 30.0 - 36.0 g/dL   RDW 27.0 (H) 11.5 - 15.5 %   Platelets 237 150 - 400 K/uL   nRBC 0.0 0.0 - 0.2 %   Neutrophils Relative % 73 %   Neutro Abs 6.5 1.7 - 7.7 K/uL   Lymphocytes Relative 19 %   Lymphs Abs 1.7 0.7 - 4.0 K/uL   Monocytes Relative 5 %   Monocytes Absolute 0.4 0.1 - 1.0 K/uL   Eosinophils Relative 2 %   Eosinophils Absolute 0.1 0.0 - 0.5 K/uL   Basophils Relative 0 %   Basophils Absolute 0.0 0.0 - 0.1 K/uL   WBC Morphology MORPHOLOGY UNREMARKABLE    RBC Morphology MIXED  RBCS POPULATION    Smear Review Normal platelet morphology    Immature Granulocytes 1 %   Abs  Immature Granulocytes 0.09 (H) 0.00 - 0.07 K/uL   Schistocytes PRESENT     Comment: Performed at [facility], [address] [place], [place] [address]  Urinalysis, Complete w Microscopic     Status: Abnormal   Collection Time: 06/27/19  6:14 PM  Result Value Ref Range   Color, Urine YELLOW (A) YELLOW   APPearance CLEAR (A) CLEAR   Specific Gravity, Urine 1.012 1.005 - 1.030   pH 7.0 5.0 - 8.0   Glucose, UA NEGATIVE NEGATIVE mg/dL   Hgb urine dipstick NEGATIVE NEGATIVE   Bilirubin Urine NEGATIVE NEGATIVE   Ketones, ur 5 (A) NEGATIVE mg/dL   Protein, ur NEGATIVE NEGATIVE mg/dL   Nitrite NEGATIVE NEGATIVE   Leukocytes,Ua NEGATIVE NEGATIVE   RBC / HPF 0-5 0 - 5 RBC/hpf   WBC, UA 0-5 0 - 5 WBC/hpf   Bacteria, UA NONE SEEN NONE SEEN   Squamous Epithelial / LPF 0-5 0 - 5   Mucus PRESENT     Comment: Performed at [facility], [address] [place], [place] [address]  CBC      Status: Abnormal   Collection Time: 06/28/19  6:08 AM  Result Value Ref Range   WBC 8.4 4.0 - 10.5 K/uL   RBC 4.57 3.87 - 5.11 MIL/uL   Hemoglobin 9.9 (L) 12.0 - 15.0 g/dL   HCT 14.9 (L) 70.2 - 63.7 %   MCV 73.1 (L) 80.0 - 100.0 fL   MCH 21.7 (L) 26.0 - 34.0 pg   MCHC 29.6 (L) 30.0 - 36.0 g/dL   RDW 85.8 (H) 85.0 - 27.7 %   Platelets 220 150 - 400 K/uL   nRBC 0.0 0.0 - 0.2 %    Comment: Performed at [facility], [address] [place], [place] [address]  Comprehensive metabolic panel     Status: Abnormal   Collection Time: 06/28/19  6:08 AM  Result Value Ref Range   Sodium 135 135 - 145 mmol/L   Potassium 3.6 3.5 - 5.1 mmol/L   Chloride 106 98 - 111 mmol/L   CO2 20 (L) 22 - 32 mmol/L   Glucose, Bld 118 (H) 70 - 99 mg/dL   BUN <5 (L) 6 - 20 mg/dL   Creatinine, Ser <8.67 (L) 0.44 - 1.00 mg/dL   Calcium 8.6 (L) 8.9 - 10.3 mg/dL   Total Protein 6.3 (L) 6.5 - 8.1 g/dL   Albumin 2.9 (L) 3.5 - 5.0 g/dL   AST 17 15 - 41 U/L   ALT 15 0 - 44 U/L   Alkaline Phosphatase 136 (H) 38 - 126 U/L   Total Bilirubin 0.5 0.3 - 1.2 mg/dL   GFR calc non Af Amer NOT CALCULATED >60 mL/min   GFR calc Af Amer NOT CALCULATED >60 mL/min   Anion gap 9 5 - 15    Comment: Performed at [facility], [address] [place], [place] [address]  Lipase, blood     Status: None   Collection Time: 06/28/19  6:15 PM  Result Value Ref Range   Lipase 22 11 - 51 U/L    Comment: Performed at [facility], [address] [place], [place] [address]      Treatments:   Discharge Exam: Blood pressure 96/64, pulse 66, temperature 98.8 F (37.1 C), temperature source Oral, resp. rate 18, height 4\' 11"  (1.499 m), weight 70.3 kg, SpO2 98 %, unknown if currently breastfeeding. General appearance: alert and cooperative Resp: clear to auscultation bilaterally Cardio:  regular rate and rhythm, S1, S2 normal, no murmur, click, rub or gallop GI: soft, non-tender; bowel sounds normal; no  masses,  no organomegaly  Disposition: Discharge disposition: 01-Home or Self Care      D/c home  Discharge Instructions    Call MD for:   Complete by: As directed    Increasing upper abdominal pain / flank pain   Call MD for:  difficulty breathing, headache or visual disturbances   Complete by: As directed    Call MD for:  extreme fatigue   Complete by: As directed    Call MD for:  hives   Complete by: As directed    Call MD for:  persistant dizziness or light-headedness   Complete by: As directed    Call MD for:  persistant nausea and vomiting   Complete by: As directed    Call MD for:  redness, tenderness, or signs of infection (pain, swelling, redness, odor or green/yellow discharge around incision site)   Complete by: As directed    Call MD for:  severe uncontrolled pain   Complete by: As directed    Call MD for:  temperature >100.4   Complete by: As directed    Diet - low sodium heart healthy   Complete by: As directed    Increase activity slowly   Complete by: As directed      Allergies as of 06/30/2019   No Known Allergies     Medication List    STOP taking these medications   acetaminophen 500 MG tablet Commonly known as: TYLENOL   clotrimazole 1 % vaginal cream Commonly known as: Clotrimazole-7   multivitamin-prenatal 27-0.8 MG Tabs tablet     TAKE these medications   docusate sodium 100 MG capsule Commonly known as: COLACE Take 1 capsule (100 mg total) by mouth 2 (two) times daily.   ferrous sulfate 325 (65 FE) MG tablet Take 325 mg by mouth 3 (three) times daily.   HYDROcodone-acetaminophen 5-325 MG tablet Commonly known as: NORCO/VICODIN t1 po q 8 hrs prn pain   ondansetron 4 MG disintegrating tablet Commonly known as: ZOFRAN-ODT Take 1 tablet (4 mg total) by mouth every 6 (six) hours as needed for nausea.   pantoprazole 40 MG tablet Commonly known as: PROTONIX Take 1 tablet (40 mg total) by mouth 2 (two) times daily.       Follow-up Information    Department, [facility]. Schedule an appointment as soon as possible for a visit.   Contact information: [address] [place] [address] [place] [phone]           Signed: Ihor Austinhomas J  06/30/2019, 11:40 AM

## 2019-06-30 NOTE — OB Triage Note (Signed)
Daily NST- M/B nurse reports pt has been having abd tightness this AM. Pt reports this tightness woke her up around 7am this morning. Reports tightening/pressure in her lower abd that radiates to her back. Pt reports this pain comes and goes a few times and hour and rates 5/10. Reports she has been drinking plenty of fluids. Some naudea but no vomiting. Will continue to monitor pt

## 2019-07-01 ENCOUNTER — Telehealth: Payer: Self-pay

## 2019-07-01 NOTE — Telephone Encounter (Signed)
-----   Message from Lin Landsman, MD sent at 07/01/2019  2:13 PM EST ----- Please fax a copy of these results to [facility]. She is currently pregnant.  After delivery, she should be treated for Helicobacter pylori infection  [name]

## 2019-07-01 NOTE — Telephone Encounter (Signed)
Faxed results to the health department

## 2019-07-02 ENCOUNTER — Ambulatory Visit: Payer: Self-pay | Admitting: Family Medicine

## 2019-07-02 ENCOUNTER — Other Ambulatory Visit: Payer: Self-pay

## 2019-07-02 ENCOUNTER — Encounter: Payer: Self-pay | Admitting: Family Medicine

## 2019-07-02 ENCOUNTER — Telehealth: Payer: Self-pay | Admitting: General Practice

## 2019-07-02 VITALS — BP 104/67 | Temp 97.7°F | Wt 155.6 lb

## 2019-07-02 DIAGNOSIS — O09299 Supervision of pregnancy with other poor reproductive or obstetric history, unspecified trimester: Secondary | ICD-10-CM

## 2019-07-02 DIAGNOSIS — B379 Candidiasis, unspecified: Secondary | ICD-10-CM

## 2019-07-02 DIAGNOSIS — A048 Other specified bacterial intestinal infections: Secondary | ICD-10-CM

## 2019-07-02 DIAGNOSIS — Z3480 Encounter for supervision of other normal pregnancy, unspecified trimester: Secondary | ICD-10-CM

## 2019-07-02 LAB — WET PREP FOR TRICH, YEAST, CLUE: Trichomonas Exam: NEGATIVE

## 2019-07-02 LAB — OB RESULTS CONSOLE GBS: GBS: NEGATIVE

## 2019-07-02 MED ORDER — CLOTRIMAZOLE 1 % VA CREA: 1.0000 | TOPICAL_CREAM | Freq: Every day | VAGINAL | 0 refills | Status: DC

## 2019-07-02 MED ORDER — AMOXICILLIN 500 MG PO CAPS: 1000.0000 mg | ORAL_CAPSULE | Freq: Two times a day (BID) | ORAL | 0 refills | Status: AC

## 2019-07-02 MED ORDER — METRONIDAZOLE 500 MG PO TABS: 500.0000 mg | ORAL_TABLET | Freq: Two times a day (BID) | ORAL | 0 refills | Status: DC

## 2019-07-02 NOTE — Progress Notes (Addendum)
Here today for 36.4 week MH RV. Taking PNV QD as well as Iron TID. Was discharged from [facility] 06/30/2019. States "I just feel so weak." Complains of vaginal itching. 36 week labs today. Hal Morales, RN

## 2019-07-02 NOTE — Progress Notes (Signed)
PRENATAL VISIT NOTE  Subjective:  Pamela Cole is a 23 y.o. G3P2002 at [redacted]w[redacted]d being seen today for ongoing prenatal care.  She is currently monitored for the following issues for this high-risk pregnancy and has Anemia affecting pregnancy in third trimester; Supervision of high risk pregnancy in third trimester; Abnormal cervical cytology; Supervision of other normal pregnancy, antepartum; IUGR (intrauterine growth retardation) in prior pregnancy, pregnant; Vaginal bleeding in pregnancy, second trimester; At risk for abnormal blood glucose level; Abdominal cramping affecting pregnancy; Indication for care in labor and delivery, antepartum; (Suspected) Pyelonephritis affecting pregnancy in third trimester; History of abnormal cervical Pap smear; Abdominal pain affecting pregnancy, antepartum--BMTZ x2 06/27/19, 06/28/19--fatty liver, gallbladder polyps, GI consult; and Right upper quadrant abdominal pain on their problem list.   Contractions: Irregular.  .  Movement: Present. Denies leaking of fluid/ROM.   Pt is s/p hospital d/c 2 days ago where per discharge summary: "Abd u/s + renal u/s + MRCP performed wihtout any significant findings . GI consultation done . Pt was started on  Protonix . On d/cpain was improving and she tolerated regular food without n/v . She remained afebrile and her labs were unremarkable (hpylori pending) . Betamethasone injections x2 given . NST s all reactive including day of discharge."   Since that time H Pylori lab resulted positive, with note from Dr. Ouida Sills recommending treatment. She has not yet received any treatment for this.   She is taking protonix which is helping with epigastric pain though endorses a new RUQ pain, "feels like burning inside." This comes after eating and is 10/10 pain when it comes. Otherwise she remains in 5/6 out of 10 pain while taking Norco/vicodin every 8 hours which she received on hospital discharge. She is able to eat and  drink but is nauseous after eating.   States she has been having irregular contractions, which were present in the hospital and returned after discharge.  They sometimes start during activity, last 30 seconds, 30-40 minutes between, but then stop, restart several hours later. Not present currently.  She also continues to have vaginal itching. Stopped clotrimazole treatment for yeast infection after a few days.    The following portions of the patient's history were reviewed and updated as appropriate: allergies, current medications, past family history, past medical history, past social history, past surgical history and problem list. Problem list updated.  Objective:   Vitals:   07/02/19 1055  BP: 104/67  Temp: 97.7 F (36.5 C)  Weight: 155 lb 9.6 oz (70.6 kg)    Fetal Status: Fetal Heart Rate (bpm): 150 Fundal Height: 36 cm Movement: Present  Presentation: Vertex  General:  Alert, oriented and cooperative. Patient is in no acute distress though prefers to lay on exam table.   Skin: Skin is warm and dry. No rash noted.   Cardiovascular: Normal heart rate noted  Respiratory: Normal respiratory effort, no problems with respiration noted  Abdomen: Soft, gravid, appropriate for gestational age. Subective  Pain/Pressure: Present   (not objectively with palpation). No contractions at present.  Pelvic: Cervix normal, white discharge in vaginal vault.  Extremities: Normal range of motion.  Edema: None  Mental Status: Normal mood and affect. Normal behavior. Normal judgment and thought content.   Assessment and Plan:  Pregnancy: G3P2002 at [redacted]w[redacted]d   1. H. pylori infection -Confirmed with Dr. Ouida Sills pt should receive treatment during pregnancy and discussed plan with Dr. Ernestina Patches, will treat as below.  - As she endorses high levels of pain (  fairly controlled with vicodin) and has a history of vomiting, strict instructions given to return to hospital if pain worsens/uncontrolled, she feels  weak, unable to eat/drink appropriately, contractions persist/increase or she is unable to keep antibiotics down. She states understanding and is comfortable with this plan. Otherwise, since we are closed Thurs-Sun this week, RTC on Monday for f/u.  - metroNIDAZOLE (FLAGYL) 500 MG tablet; Take 1 tablet (500 mg total) by mouth 2 (two) times daily for 14 days.  Dispense: 28 tablet; Refill: 0 - amoxicillin 1000 mg BID x 14 days - continue protonix 40mg  BID x 14 days  2. Supervision of other normal pregnancy, antepartum Labs today as below. - Chlamydia/GC NAA, Confirmation - GBS Culture - WET PREP FOR TRICH, YEAST, CLUE  3. Yeast infection Restart treatment with clotrimazole cream, prescribed today.   4. IUGR (intrauterine growth retardation) in prior pregnancy, pregnant      Preterm labor symptoms and general obstetric precautions including but not limited to vaginal bleeding, contractions, leaking of fluid and fetal movement were reviewed in detail with the patient. Please refer to After Visit Summary for other counseling recommendations.  Return in 5 days (on 07/07/2019) for prenatal follow up.  Future Appointments  Date Time Provider Department Center  07/07/2019  1:40 PM [facility] PROVIDER [facility] None  07/15/2019  1:30 PM [facility] None  07/15/2019  1:45 PM 14/03/2019, Orlie Dakin, MD [facility] None  07/15/2019  2:00 PM [facility] INFUSION CHAIR 4 [facility] None    Japheth Diekman L Sienna Stonehocker, PA-C

## 2019-07-02 NOTE — Telephone Encounter (Signed)
QUESTION FOR DOCTOR

## 2019-07-02 NOTE — Telephone Encounter (Signed)
Returned phone call to patient. Patient states she took the first antibiotic tablet "that we gave her today and vomited that back up." RN counseled to make sure patient is taking with food and if continues to not tolerate medication to notify clinic on Monday 07/07/2019 due to agency closed 11/26-11/30. Patient also requesting a different antibiotic than we prescribed and patient picked up at pharmacy stating "they are really big pills and I have problems swallowing pills." RN counseled patient that she could break tablets in half and swallow on half at a time and to take with apple sauce, pudding or something soft to assist with swallowing. Patient agreeable to do so. No further questions or concerns voiced. Hal Morales, RN

## 2019-07-04 ENCOUNTER — Encounter: Payer: Self-pay | Admitting: Family Medicine

## 2019-07-04 LAB — CHLAMYDIA/GC NAA, CONFIRMATION
Chlamydia trachomatis, NAA: NEGATIVE
Neisseria gonorrhoeae, NAA: NEGATIVE

## 2019-07-06 LAB — CULTURE, BETA STREP (GROUP B ONLY): Strep Gp B Culture: NEGATIVE

## 2019-07-07 ENCOUNTER — Other Ambulatory Visit: Payer: Self-pay

## 2019-07-07 ENCOUNTER — Ambulatory Visit: Payer: Medicaid Other | Admitting: Advanced Practice Midwife

## 2019-07-07 VITALS — BP 105/69 | Temp 98.1°F | Wt 154.0 lb

## 2019-07-07 DIAGNOSIS — A048 Other specified bacterial intestinal infections: Secondary | ICD-10-CM

## 2019-07-07 DIAGNOSIS — O99013 Anemia complicating pregnancy, third trimester: Secondary | ICD-10-CM

## 2019-07-07 DIAGNOSIS — O26899 Other specified pregnancy related conditions, unspecified trimester: Secondary | ICD-10-CM

## 2019-07-07 DIAGNOSIS — O0993 Supervision of high risk pregnancy, unspecified, third trimester: Secondary | ICD-10-CM

## 2019-07-07 DIAGNOSIS — R109 Unspecified abdominal pain: Secondary | ICD-10-CM

## 2019-07-07 NOTE — Progress Notes (Signed)
   PRENATAL VISIT NOTE  Subjective:  Pamela Cole is a 23 y.o. G3P2002 at [redacted]w[redacted]d being seen today for ongoing prenatal care.  She is currently monitored for the following issues for this high-risk pregnancy and has Anemia affecting pregnancy in third trimester; Supervision of high risk pregnancy in third trimester; Abnormal cervical cytology; Supervision of other normal pregnancy, antepartum; IUGR (intrauterine growth retardation) in prior pregnancy, pregnant; Vaginal bleeding in pregnancy, second trimester; At risk for abnormal blood glucose level; Abdominal cramping affecting pregnancy; Indication for care in labor and delivery, antepartum; H. pylori infection; History of abnormal cervical Pap smear; Abdominal pain affecting pregnancy, antepartum--BMTZ x2 06/27/19, 06/28/19--fatty liver, gallbladder polyps, GI consult; and Right upper quadrant abdominal pain on their problem list.  Patient reports right upper abdominal pain continues, diarrhea qoday, clear vaginal fluid since 07/04/19.  Contractions: Irritability. Vag. Bleeding: None.  Movement: Present.   The following portions of the patient's history were reviewed and updated as appropriate: allergies, current medications, past family history, past medical history, past social history, past surgical history and problem list. Problem list updated.  Objective:   Vitals:   07/07/19 1339  BP: 105/69  Temp: 98.1 F (36.7 C)  Weight: 154 lb (69.9 kg)    Fetal Status: Fetal Heart Rate (bpm): 160 Fundal Height: 36 cm Movement: Present  Presentation: Vertex  General:  Alert, oriented and cooperative. Patient is in no acute distress.  Skin: Skin is warm and dry. No rash noted.   Cardiovascular: Normal heart rate noted  Respiratory: Normal respiratory effort, no problems with respiration noted  Abdomen: Soft, gravid, appropriate for gestational age.  Pain/Pressure: Present     Pelvic: Cervical exam performed        Extremities:  Normal range of motion.  Edema: None  Mental Status: Normal mood and affect. Normal behavior. Normal judgment and thought content.   Assessment and Plan:  Pregnancy: G3P2002 at [redacted]w[redacted]d  1. H. pylori infection Taking Flagyl 500 mg BID x 14 days, Amoxicillin 1000 mg BID x 14 days, Protonix 40 mg BID x 14 days, Norco/vicodin q 8 hrs for pain that is relieved somewhat from 10/10 to 5/10   3. Anemia affecting pregnancy in third trimester Taking FeSo4 TID with oj  4. Abdominal pain affecting pregnancy, antepartum--BMTZ x2 06/27/19, 06/28/19--fatty liver, gallbladder polyps, GI consult Pt states she is same as last week.  Afebrile.  "Burning pain worse after I eat 10/10".  Diarrhea qoday.  Getting "very tired of being in pain all day".  Irritable uterus with occassional back pain.  Clear vaginal fluid leaking since 07/04/19 afternoon--sterile speculum exam with no pooling, nitrazine negative, ferning neg.  SVE posterior, soft, 50%, 1-2 cm.  Pt counseled to go to L&D if symptoms worsen or labor   Preterm labor symptoms and general obstetric precautions including but not limited to vaginal bleeding, contractions, leaking of fluid and fetal movement were reviewed in detail with the patient. Please refer to After Visit Summary for other counseling recommendations.  Return in about 1 week (around 07/14/2019) for routine PNC.  Future Appointments  Date Time Provider [place]  07/14/2019  1:40 PM [facility] PROVIDER [facility] None  07/15/2019  1:30 PM [facility] None  07/15/2019  1:45 PM Grayland Ormond, Kathlene November, MD [facility] None  07/15/2019  2:00 PM [place] INFUSION CHAIR 4 [facility] None    Herbie Saxon, CNM

## 2019-07-07 NOTE — Progress Notes (Signed)
Patient here for MH RV at 26 2/7.Marland KitchenMarland KitchenJenetta Downer, RN

## 2019-07-09 ENCOUNTER — Inpatient Hospital Stay
Admission: EM | Admit: 2019-07-09 | Discharge: 2019-07-11 | DRG: 807 | Disposition: A | Discharge status: Home / Self Care | Payer: Medicaid Other | Attending: Obstetrics and Gynecology | Admitting: Obstetrics and Gynecology

## 2019-07-09 ENCOUNTER — Other Ambulatory Visit: Payer: Self-pay

## 2019-07-09 ENCOUNTER — Telehealth: Payer: Self-pay | Admitting: Family Medicine

## 2019-07-09 DIAGNOSIS — O09299 Supervision of pregnancy with other poor reproductive or obstetric history, unspecified trimester: Secondary | ICD-10-CM

## 2019-07-09 DIAGNOSIS — R109 Unspecified abdominal pain: Secondary | ICD-10-CM | POA: Diagnosis present

## 2019-07-09 DIAGNOSIS — O26899 Other specified pregnancy related conditions, unspecified trimester: Secondary | ICD-10-CM | POA: Diagnosis present

## 2019-07-09 DIAGNOSIS — Z3A37 37 weeks gestation of pregnancy: Secondary | ICD-10-CM | POA: Diagnosis not present

## 2019-07-09 DIAGNOSIS — Z3480 Encounter for supervision of other normal pregnancy, unspecified trimester: Secondary | ICD-10-CM

## 2019-07-09 DIAGNOSIS — Z20828 Contact with and (suspected) exposure to other viral communicable diseases: Secondary | ICD-10-CM | POA: Diagnosis present

## 2019-07-09 DIAGNOSIS — A048 Other specified bacterial intestinal infections: Secondary | ICD-10-CM

## 2019-07-09 DIAGNOSIS — R1011 Right upper quadrant pain: Secondary | ICD-10-CM | POA: Diagnosis present

## 2019-07-09 LAB — TYPE AND SCREEN
ABO/RH(D): O POS
Antibody Screen: NEGATIVE

## 2019-07-09 LAB — PROTEIN / CREATININE RATIO, URINE
Creatinine, Urine: 100 mg/dL
Protein Creatinine Ratio: 0.13 mg/mg{Cre} (ref 0.00–0.15)
Total Protein, Urine: 13 mg/dL

## 2019-07-09 LAB — COMPREHENSIVE METABOLIC PANEL
ALT: 12 U/L (ref 0–44)
AST: 17 U/L (ref 15–41)
Albumin: 3.2 g/dL — ABNORMAL LOW (ref 3.5–5.0)
Alkaline Phosphatase: 156 U/L — ABNORMAL HIGH (ref 38–126)
Anion gap: 12 (ref 5–15)
BUN: <5 mg/dL — ABNORMAL LOW (ref 6–20)
CO2: 18 mmol/L — ABNORMAL LOW (ref 22–32)
Calcium: 8.8 mg/dL — ABNORMAL LOW (ref 8.9–10.3)
Chloride: 104 mmol/L (ref 98–111)
Creatinine, Ser: <0.3 mg/dL — ABNORMAL LOW (ref 0.44–1.00)
Glucose, Bld: 71 mg/dL (ref 70–99)
Potassium: 3.5 mmol/L (ref 3.5–5.1)
Sodium: 134 mmol/L — ABNORMAL LOW (ref 135–145)
Total Bilirubin: 0.4 mg/dL (ref 0.3–1.2)
Total Protein: 6.7 g/dL (ref 6.5–8.1)

## 2019-07-09 LAB — CBC
HCT: 36.4 % (ref 36.0–46.0)
Hemoglobin: 11.1 g/dL — ABNORMAL LOW (ref 12.0–15.0)
MCH: 22.4 pg — ABNORMAL LOW (ref 26.0–34.0)
MCHC: 30.5 g/dL (ref 30.0–36.0)
MCV: 73.5 fL — ABNORMAL LOW (ref 80.0–100.0)
Platelets: 267 10*3/uL (ref 150–400)
RBC: 4.95 MIL/uL (ref 3.87–5.11)
WBC: 9.5 10*3/uL (ref 4.0–10.5)
nRBC: 0 % (ref 0.0–0.2)

## 2019-07-09 LAB — SARS CORONAVIRUS 2 BY RT PCR (HOSPITAL ORDER, PERFORMED IN ~~LOC~~ HOSPITAL LAB): SARS Coronavirus 2: NEGATIVE

## 2019-07-09 MED ORDER — ACETAMINOPHEN 325 MG PO TABS: 650.0000 mg | ORAL_TABLET | ORAL | Status: DC | PRN

## 2019-07-09 MED ORDER — LACTATED RINGERS IV SOLN
INTRAVENOUS | Status: DC
  Administered 2019-07-09 – 2019-07-10 (×2): via INTRAVENOUS

## 2019-07-09 MED ORDER — ONDANSETRON HCL 4 MG/2ML IJ SOLN
4.0000 mg | Freq: Four times a day (QID) | INTRAMUSCULAR | Status: DC | PRN
  Administered 2019-07-10: 4 mg via INTRAVENOUS
  Filled 2019-07-09: qty 2

## 2019-07-09 MED ORDER — OXYCODONE-ACETAMINOPHEN 5-325 MG PO TABS: 1.0000 | ORAL_TABLET | ORAL | Status: DC | PRN

## 2019-07-09 MED ORDER — LACTATED RINGERS IV SOLN: 500.0000 mL | INTRAVENOUS | Status: DC | PRN

## 2019-07-09 MED ORDER — LIDOCAINE HCL (PF) 1 % IJ SOLN
30.0000 mL | INTRAMUSCULAR | Status: AC | PRN
  Administered 2019-07-10: 30 mL via SUBCUTANEOUS

## 2019-07-09 MED ORDER — AMMONIA AROMATIC IN INHA
RESPIRATORY_TRACT | Status: AC
  Filled 2019-07-09: qty 10

## 2019-07-09 MED ORDER — OXYTOCIN 40 UNITS IN NORMAL SALINE INFUSION - SIMPLE MED
INTRAVENOUS | Status: AC
  Filled 2019-07-09: qty 1000

## 2019-07-09 MED ORDER — TERBUTALINE SULFATE 1 MG/ML IJ SOLN: 0.2500 mg | Freq: Once | INTRAMUSCULAR | Status: DC | PRN

## 2019-07-09 MED ORDER — SOD CITRATE-CITRIC ACID 500-334 MG/5ML PO SOLN: 30.0000 mL | ORAL | Status: DC | PRN

## 2019-07-09 MED ORDER — PANTOPRAZOLE SODIUM 40 MG PO TBEC
40.0000 mg | DELAYED_RELEASE_TABLET | Freq: Two times a day (BID) | ORAL | Status: DC
  Administered 2019-07-09: 40 mg via ORAL
  Filled 2019-07-09: qty 1

## 2019-07-09 MED ORDER — OXYTOCIN 40 UNITS IN NORMAL SALINE INFUSION - SIMPLE MED: 2.5000 [IU]/h | INTRAVENOUS | Status: DC

## 2019-07-09 MED ORDER — OXYTOCIN 40 UNITS IN NORMAL SALINE INFUSION - SIMPLE MED
1.0000 m[IU]/min | INTRAVENOUS | Status: DC
  Administered 2019-07-09: 4 m[IU]/min via INTRAVENOUS

## 2019-07-09 MED ORDER — LIDOCAINE HCL (PF) 1 % IJ SOLN
INTRAMUSCULAR | Status: AC
  Filled 2019-07-09: qty 30

## 2019-07-09 MED ORDER — OXYTOCIN BOLUS FROM INFUSION
500.0000 mL | Freq: Once | INTRAVENOUS | Status: AC
  Administered 2019-07-10: 500 mL via INTRAVENOUS

## 2019-07-09 MED ORDER — OXYTOCIN 10 UNIT/ML IJ SOLN
INTRAMUSCULAR | Status: AC
  Filled 2019-07-09: qty 2

## 2019-07-09 MED ORDER — BUTORPHANOL TARTRATE 1 MG/ML IJ SOLN
1.0000 mg | INTRAMUSCULAR | Status: DC | PRN
  Administered 2019-07-10: 1 mg via INTRAVENOUS
  Filled 2019-07-09: qty 1

## 2019-07-09 MED ORDER — MISOPROSTOL 200 MCG PO TABS
ORAL_TABLET | ORAL | Status: AC
  Filled 2019-07-09: qty 4

## 2019-07-09 MED ORDER — OXYCODONE-ACETAMINOPHEN 5-325 MG PO TABS: 2.0000 | ORAL_TABLET | ORAL | Status: DC | PRN

## 2019-07-09 NOTE — H&P (Signed)
Pamela Cole is a 23 y.o. female presenting for continue RUQ pain . PT was admitted 10 days ago for the same . Extensive workup performed with only finding of H.pylori . She was d/c and treated . Returns again with N/V and RUQ pain . SHe is s/p betamethasone . Pt has been taking narcotics to control pain   OB History    Gravida  3   Para  2   Term  2   Preterm      AB      Living  2     SAB      TAB      Ectopic      Multiple  0   Live Births  2          Past Medical History:  Diagnosis Date  . Anemia   . Cholelithiasis   . UTI (urinary tract infection)    Hx of UTI x 1   Past Surgical History:  Procedure Laterality Date  . NO PAST SURGERIES    . TOOTH EXTRACTION Left 01/07/2019   Family History: family history includes Asthma in her brother; Breast cancer in her maternal grandmother; Hypertension in her mother. Social History:  reports that she has never smoked. She has never used smokeless tobacco. She reports that she does not drink alcohol or use drugs.      Maternal Diabetes: No Genetic Screening: Normal Maternal Ultrasounds/Referrals: Normal Fetal Ultrasounds or other Referrals:  None Maternal Substance Abuse:  No Significant Maternal Medications:  None Significant Maternal Lab Results:  Group B Strep negative Other Comments:  None ROS History Dilation: 2 Effacement (%): 80 Station: -1 Exam by:: Scxhermerhorn MD Blood pressure 103/61, pulse 84, temperature 98.8 F (37.1 C), temperature source Oral, resp. rate 16, height 4\' 11"  (1.499 m), weight 69.9 kg, unknown if currently breastfeeding.   AROM bt TJS at 2100 blood tinged  Exam Physical Exam   ABd + TTP ruq , no rebound TTp Lungs CTA   CV RRR  efm : 140 + accels , no decels in frequent CTX   Prenatal labs: ABO, Rh: --/--/O POS (12/02 1853) Antibody: NEG (12/02 1853) Rubella: Immune (06/04 0000) RPR: Non Reactive (10/09 1013)  HBsAg: Negative (06/04 0000)  HIV: Non  reactive (10/09 0000)  GBS: Negative/-- (11/25 1215)   Assessment/Plan:persistent RUQ pain etiology uncertain . PAin requiring narcotics  PT is now 37+4 weeks . Risk / benefits discussed and the pt and I decided to induce labor given this pain most likely will resolve PP  reassuring fetal monitoring  Pitocin augmentation  Gwen Her Schermerhorn 07/09/2019, 9:01 PM

## 2019-07-09 NOTE — Telephone Encounter (Addendum)
Returned patient phone call. No answer, LMTC. Hal Morales, RN   Per Epic system patient currently at [facility] ED. Hal Morales, RN

## 2019-07-09 NOTE — Telephone Encounter (Signed)
Patient wants to let her Doctor know that her pain is getting worse and is throwing up.

## 2019-07-09 NOTE — OB Triage Note (Addendum)
Pt presents to ED c/o of right back/flank pain. Pt is [redacted]w[redacted]d G3P2002. Pt states she has been having right sided back pain the past couple weeks and was previously admitted to the hospital for this issue. Pt is also c/o right upper gastric burning whenever she eats. Pt has not been able to keep down solids only been able to keep liquids down.  Pt also is having some abdominal tightness with the back pain. Pt denies LOF or vaginal bleeding and states positive fetal movement.  External monitors applied and assessing. Initial FHR 150. VSS.

## 2019-07-10 ENCOUNTER — Inpatient Hospital Stay: Payer: Medicaid Other | Admitting: Anesthesiology

## 2019-07-10 ENCOUNTER — Encounter: Payer: Self-pay | Admitting: Anesthesiology

## 2019-07-10 LAB — RPR: RPR Ser Ql: NONREACTIVE

## 2019-07-10 MED ORDER — OXYCODONE HCL 5 MG PO TABS: 5.0000 mg | ORAL_TABLET | ORAL | Status: DC | PRN

## 2019-07-10 MED ORDER — EPHEDRINE 5 MG/ML INJ: 10.0000 mg | INTRAVENOUS | Status: DC | PRN

## 2019-07-10 MED ORDER — MAGNESIUM HYDROXIDE 400 MG/5ML PO SUSP: 30.0000 mL | ORAL | Status: DC | PRN

## 2019-07-10 MED ORDER — SODIUM CHLORIDE 0.9 % IV SOLN
INTRAVENOUS | Status: DC | PRN
  Administered 2019-07-10 (×2): 4 mL via EPIDURAL

## 2019-07-10 MED ORDER — SIMETHICONE 80 MG PO CHEW: 80.0000 mg | CHEWABLE_TABLET | ORAL | Status: DC | PRN

## 2019-07-10 MED ORDER — PHENYLEPHRINE 40 MCG/ML (10ML) SYRINGE FOR IV PUSH (FOR BLOOD PRESSURE SUPPORT): 80.0000 ug | PREFILLED_SYRINGE | INTRAVENOUS | Status: DC | PRN

## 2019-07-10 MED ORDER — DIBUCAINE (PERIANAL) 1 % EX OINT: 1.0000 "application " | TOPICAL_OINTMENT | CUTANEOUS | Status: DC | PRN

## 2019-07-10 MED ORDER — COCONUT OIL OIL: 1.0000 "application " | TOPICAL_OIL | Status: DC | PRN

## 2019-07-10 MED ORDER — FERROUS SULFATE 325 (65 FE) MG PO TABS
325.0000 mg | ORAL_TABLET | Freq: Two times a day (BID) | ORAL | Status: DC
  Administered 2019-07-10 – 2019-07-11 (×3): 325 mg via ORAL
  Filled 2019-07-10 (×2): qty 1

## 2019-07-10 MED ORDER — SENNOSIDES-DOCUSATE SODIUM 8.6-50 MG PO TABS
2.0000 | ORAL_TABLET | ORAL | Status: DC
  Administered 2019-07-10: 2 via ORAL
  Filled 2019-07-10 (×2): qty 2

## 2019-07-10 MED ORDER — ZOLPIDEM TARTRATE 5 MG PO TABS: 5.0000 mg | ORAL_TABLET | Freq: Every evening | ORAL | Status: DC | PRN

## 2019-07-10 MED ORDER — LIDOCAINE-EPINEPHRINE (PF) 1.5 %-1:200000 IJ SOLN
INTRAMUSCULAR | Status: DC | PRN
  Administered 2019-07-10: 3 mL via EPIDURAL

## 2019-07-10 MED ORDER — LACTATED RINGERS IV SOLN: 500.0000 mL | Freq: Once | INTRAVENOUS | Status: DC

## 2019-07-10 MED ORDER — LIDOCAINE HCL (PF) 1 % IJ SOLN
INTRAMUSCULAR | Status: DC | PRN
  Administered 2019-07-10: 1 mL via INTRADERMAL

## 2019-07-10 MED ORDER — ONDANSETRON HCL 4 MG/2ML IJ SOLN: 4.0000 mg | INTRAMUSCULAR | Status: DC | PRN

## 2019-07-10 MED ORDER — WITCH HAZEL-GLYCERIN EX PADS: 1.0000 "application " | MEDICATED_PAD | CUTANEOUS | Status: DC | PRN

## 2019-07-10 MED ORDER — FENTANYL 2.5 MCG/ML W/ROPIVACAINE 0.15% IN NS 100 ML EPIDURAL (ARMC)
EPIDURAL | Status: AC
  Filled 2019-07-10: qty 100

## 2019-07-10 MED ORDER — METHYLERGONOVINE MALEATE 0.2 MG/ML IJ SOLN
INTRAMUSCULAR | Status: AC
  Administered 2019-07-10: 0.2 mg via INTRAMUSCULAR
  Filled 2019-07-10: qty 1

## 2019-07-10 MED ORDER — FENTANYL 2.5 MCG/ML W/ROPIVACAINE 0.15% IN NS 100 ML EPIDURAL (ARMC)
12.0000 mL/h | EPIDURAL | Status: DC
  Administered 2019-07-10: 12 mL/h via EPIDURAL

## 2019-07-10 MED ORDER — MEASLES, MUMPS & RUBELLA VAC IJ SOLR
0.5000 mL | Freq: Once | INTRAMUSCULAR | Status: DC
  Filled 2019-07-10: qty 0.5

## 2019-07-10 MED ORDER — DIPHENHYDRAMINE HCL 25 MG PO CAPS: 25.0000 mg | ORAL_CAPSULE | Freq: Four times a day (QID) | ORAL | Status: DC | PRN

## 2019-07-10 MED ORDER — IBUPROFEN 600 MG PO TABS
600.0000 mg | ORAL_TABLET | Freq: Four times a day (QID) | ORAL | Status: DC
  Administered 2019-07-10 – 2019-07-11 (×4): 600 mg via ORAL
  Filled 2019-07-10 (×4): qty 1

## 2019-07-10 MED ORDER — BENZOCAINE-MENTHOL 20-0.5 % EX AERO
1.0000 "application " | INHALATION_SPRAY | CUTANEOUS | Status: DC | PRN
  Administered 2019-07-11: 1 via TOPICAL
  Filled 2019-07-10: qty 56

## 2019-07-10 MED ORDER — PRENATAL MULTIVITAMIN CH
1.0000 | ORAL_TABLET | Freq: Every day | ORAL | Status: DC
  Filled 2019-07-10: qty 1

## 2019-07-10 MED ORDER — ONDANSETRON HCL 4 MG PO TABS: 4.0000 mg | ORAL_TABLET | ORAL | Status: DC | PRN

## 2019-07-10 MED ORDER — ACETAMINOPHEN 325 MG PO TABS
650.0000 mg | ORAL_TABLET | ORAL | Status: DC | PRN
  Administered 2019-07-10: 650 mg via ORAL
  Filled 2019-07-10: qty 2

## 2019-07-10 MED ORDER — DIPHENHYDRAMINE HCL 50 MG/ML IJ SOLN: 12.5000 mg | INTRAMUSCULAR | Status: DC | PRN

## 2019-07-10 NOTE — Discharge Summary (Signed)
Obstetrical Discharge Summary  Patient Name: Pamela Cole DOB: June 15, 1996 MRN: [id number]  Date of Admission: 07/09/2019 Date of Delivery: 07/10/2019 Delivered by: Huel Cote MD Date of Discharge: 07/11/2019  Primary OB: [place] LMP:No LMP recorded (lmp unknown). EDC Estimated Date of Delivery: 07/26/19 Gestational Age at Delivery: [redacted]w[redacted]d   Antepartum complications:RUQ pain uncontrolled with conservative tx. Hpylori Admitting Diagnosis: RUQ pain not controlled N/V  Secondary Diagnosis: Patient Active Problem List   Diagnosis Date Noted  . Abdominal pain in pregnancy 07/09/2019  . Abdominal pain affecting pregnancy, antepartum--BMTZ x2 06/27/19, 06/28/19--fatty liver, gallbladder polyps, GI consult 06/27/2019  . Right upper quadrant abdominal pain 06/27/2019  . H. pylori infection 06/25/2019  . History of abnormal cervical Pap smear 06/25/2019  . Indication for care in labor and delivery, antepartum 06/15/2019  . Abdominal cramping affecting pregnancy 05/27/2019  . At risk for abnormal blood glucose level 05/19/2019  . Vaginal bleeding in pregnancy, second trimester 04/24/2019  . IUGR (intrauterine growth retardation) in prior pregnancy, pregnant 02/18/2019  . Abnormal cervical cytology 01/30/2019  . Supervision of other normal pregnancy, antepartum 01/30/2019  . Supervision of high risk pregnancy in third trimester 06/01/2016  . Anemia affecting pregnancy in third trimester 04/17/2016    Augmentation: AROM and Pitocin Complications: None Intrapartum complications/course: see delivery note Date of Delivery:  Delivered By: Huel Cote MD Delivery Type: spontaneous vaginal delivery Anesthesia: epidural Placenta: spontaneous Laceration:  Episiotomy: none Newborn Data: Live born female  Birth Weight: 3190g 7lb 0.5oz  APGAR: 44, 9  Newborn Delivery   Birth date/time: 07/10/2019 05:29:00 Delivery type: Vaginal, Spontaneous       Postpartum Procedures: none  Post partum course:  Patient had an uncomplicated postpartum course.  By time of discharge on PPD#1, her pain was controlled on oral pain medications; she had appropriate lochia and was ambulating, voiding without difficulty and tolerating regular diet.  She was deemed stable for discharge to home.      Discharge Physical Exam:  BP (!) 97/58 (BP Location: Right Arm)   Pulse 78   Temp 98.7 F (37.1 C)   Resp 18   Ht 4\' 11"  (1.499 m)   Wt 69.9 kg   LMP  (LMP Unknown)   SpO2 97%   Breastfeeding Unknown   BMI 31.10 kg/m   General: NAD CV: RRR Pulm: CTABL, nl effort ABD: s/nd/nt, fundus firm and below the umbilicus Lochia: moderate DVT Evaluation: LE non-ttp, no evidence of DVT on exam.  Hemoglobin  Date Value Ref Range Status  07/11/2019 10.3 (L) 12.0 - 15.0 g/dL Final  06/25/2019 10.8 (L) 11.1 - 15.9 g/dL Final   HCT  Date Value Ref Range Status  07/11/2019 34.2 (L) 36.0 - 46.0 % Final   Hematocrit  Date Value Ref Range Status  06/25/2019 35.5 34.0 - 46.6 % Final     Disposition: stable, discharge to home. Baby Feeding: breastmilk & formula Baby Disposition: home with mom  Rh Immune globulin given: n/a Rubella vaccine given: n/a Tdap vaccine given in AP or PP setting: 05/16/2019 Flu vaccine given in AP or PP setting: 05/16/2019  Contraception: Nexplanon  Prenatal Labs:  ABO, Rh: --/--/O POS (12/02 1853) Antibody: NEG (12/02 1853) Rubella: Immune (06/04 0000), Varicella ?? RPR: Non Reactive (10/09 1013)  HBsAg: Negative (06/04 0000)  HIV: Non reactive (10/09 0000)  GBS: Negative/-- (11/25 1215)   Plan:  Pamela Cole was discharged to home in good condition. Follow-up appointment with delivering provider in 6  weeks.  Discharge Medications: Allergies as of 07/11/2019   No Known Allergies     Medication List    STOP taking these medications   clotrimazole 1 % vaginal cream Commonly known as:  Clotrimazole-7   docusate sodium 100 MG capsule Commonly known as: COLACE   ferrous sulfate 325 (65 FE) MG tablet   HYDROcodone-acetaminophen 5-325 MG tablet Commonly known as: NORCO/VICODIN   metroNIDAZOLE 500 MG tablet Commonly known as: FLAGYL   ondansetron 4 MG disintegrating tablet Commonly known as: ZOFRAN-ODT   pantoprazole 40 MG tablet Commonly known as: PROTONIX     TAKE these medications   acetaminophen 325 MG tablet Commonly known as: Tylenol Take 2 tablets (650 mg total) by mouth every 4 (four) hours as needed for mild pain or moderate pain.   amoxicillin 500 MG capsule Commonly known as: AMOXIL Take 2 capsules (1,000 mg total) by mouth 2 (two) times daily for 14 days.   ibuprofen 600 MG tablet Commonly known as: ADVIL Take 1 tablet (600 mg total) by mouth every 6 (six) hours as needed for mild pain, moderate pain or cramping.   prenatal multivitamin Tabs tablet Take 1 tablet by mouth daily at 12 noon. Start taking on: July 12, 2019       Follow-up Information    Department, [facility] Schedule an appointment as soon as possible for a visit in 6 week(s).   Why: For routine postpartum visit Contact information: [address] [place] [address] [phone]           Signed: Genia Del, CNM 07/11/2019 12:37 PM

## 2019-07-10 NOTE — Anesthesia Preprocedure Evaluation (Signed)
Anesthesia Evaluation  Patient identified by MRN, date of birth, ID band Patient awake    Reviewed: Allergy & Precautions, H&P , NPO status , Patient's Chart, lab work & pertinent test results  History of Anesthesia Complications Negative for: history of anesthetic complications  Airway Mallampati: III  TM Distance: >3 FB Neck ROM: full    Dental  (+) Chipped   Pulmonary neg pulmonary ROS,           Cardiovascular Exercise Tolerance: Good (-) hypertensionnegative cardio ROS       Neuro/Psych    GI/Hepatic negative GI ROS,   Endo/Other    Renal/GU   negative genitourinary   Musculoskeletal   Abdominal   Peds  Hematology negative hematology ROS (+)   Anesthesia Other Findings 23 y.o. female presenting for continue RUQ pain . PT was admitted 10 days ago for the same . Extensive workup performed with only finding of H.pylori . She was d/c and treated . Returns again with N/V and RUQ pain . SHe is s/p betamethasone . Pt has been taking narcotics to control pain   Past Medical History: No date: Anemia No date: Cholelithiasis No date: UTI (urinary tract infection)     Comment:  Hx of UTI x 1  Past Surgical History: No date: NO PAST SURGERIES 01/07/2019: TOOTH EXTRACTION; Left  BMI    Body Mass Index: 31.10 kg/m      Reproductive/Obstetrics (+) Pregnancy                             Anesthesia Physical Anesthesia Plan  ASA: III  Anesthesia Plan: Epidural   Post-op Pain Management:    Induction:   PONV Risk Score and Plan:   Airway Management Planned:   Additional Equipment:   Intra-op Plan:   Post-operative Plan:   Informed Consent: I have reviewed the patients History and Physical, chart, labs and discussed the procedure including the risks, benefits and alternatives for the proposed anesthesia with the patient or authorized representative who has indicated his/her  understanding and acceptance.       Plan Discussed with: Anesthesiologist  Anesthesia Plan Comments:         Anesthesia Quick Evaluation

## 2019-07-10 NOTE — Anesthesia Procedure Notes (Signed)
Epidural Patient location during procedure: OB Start time: 07/10/2019 1:34 AM End time: 07/10/2019 1:38 AM  Staffing Anesthesiologist: Talonda Artist, Precious Haws, MD Performed: anesthesiologist   Preanesthetic Checklist Completed: patient identified, site marked, surgical consent, pre-op evaluation, timeout performed, IV checked, risks and benefits discussed and monitors and equipment checked  Epidural Patient position: sitting Prep: ChloraPrep Patient monitoring: heart rate, continuous pulse ox and blood pressure Approach: midline Location: L3-L4 Injection technique: LOR saline  Needle:  Needle type: Tuohy  Needle gauge: 17 G Needle length: 9 cm and 9 Needle insertion depth: 5.5 cm Catheter type: closed end flexible Catheter size: 19 Gauge Catheter at skin depth: 11.5 cm Test dose: negative and 1.5% lidocaine with Epi 1:200 K  Assessment Sensory level: T10 Events: blood not aspirated, injection not painful, no injection resistance, negative IV test and no paresthesia  Additional Notes 1 attempt Pt. Evaluated and documentation done after procedure finished. Patient identified. Risks/Benefits/Options discussed with patient including but not limited to bleeding, infection, nerve damage, paralysis, failed block, incomplete pain control, headache, blood pressure changes, nausea, vomiting, reactions to medication both or allergic, itching and postpartum back pain. Confirmed with bedside nurse the patient's most recent platelet count. Confirmed with patient that they are not currently taking any anticoagulation, have any bleeding history or any family history of bleeding disorders. Patient expressed understanding and wished to proceed. All questions were answered. Sterile technique was used throughout the entire procedure. Please see nursing notes for vital signs. Test dose was given through epidural catheter and negative prior to continuing to dose epidural or start infusion. Warning signs of  high block given to the patient including shortness of breath, tingling/numbness in hands, complete motor block, or any concerning symptoms with instructions to call for help. Patient was given instructions on fall risk and not to get out of bed. All questions and concerns addressed with instructions to call with any issues or inadequate analgesia.   Patient tolerated the insertion well without immediate complications.Reason for block:procedure for pain

## 2019-07-10 NOTE — Plan of Care (Signed)
Pt delivered vaginally at 0529. Pt recovered well in 2 hour recovery and pain managed well. Pt and baby to mother baby for couplet care.

## 2019-07-10 NOTE — Lactation Note (Signed)
This note was copied from a baby's chart. Lactation Consultation Note  Patient Name: Pamela Cole Today's Date: 07/10/2019 Reason for consult: Initial assessment  LC intern walked into the room and introduced herself. Mom was in bed and support person Magazine features editor) was bringing baby to mom to breastfeed. Grandmother and mom attempted to latch baby at the right breast while baby was swaddled. [facility] intern watched at the bedside.   After a few attempts, [place] intern offered to assist with latching the baby. Mom agreed. [facility] intern talked to mom about how to obtain a good latch. [facility] intern began by unswaddling baby and discussing comfort. Pillows were placed around mom to support the feeding position. [facility] intern asked mom what position she felt most comfortable with trying. Mom reported she wanted to try the cross cradle. Baby was bought tummy to tummy with mom, as LC demonstrated sandwiching the breast and hand expressing to milk to encourage a good latch. Mom's nipples appear to be short shafted but colostrum was easily expressible.  While attempting to latch baby to the right breast, [facility] intern discussed what a good latch should look and feel like. Baby was able to latch with hold assistance. Mom was pleased because she reported her other two children wouldn't latch. After a few minutes and adjusting the latch, mom wanted to try breastfeeding on the left breast. Mom attempted the latch on her own. LC offered assistance as needed and praised her efforts. [facility] intern encouraged mom to look for early feeding cues to bring baby to the breast.   Upon leaving the room, baby had obtained a good latch and mom seemed comfortable. Mom was encouraged to call out for assistance or any questions or concerns arise. [facility] intern will let the nurse know more pillows are needed in the room to assist with the next feed.  Maternal Data Formula Feeding for Exclusion: No  Feeding Feeding Type: Bottle Fed - Formula  LATCH  Score Latch: Repeated attempts needed to sustain latch, nipple held in mouth throughout feeding, stimulation needed to elicit sucking reflex.  Audible Swallowing: None  Type of Nipple: Flat  Comfort (Breast/Nipple): Soft / non-tender  Hold (Positioning): Full assist, staff holds infant at breast  LATCH Score: 4  Interventions    Lactation Tools Discussed/Used     Consult Status Consult Status: Follow-up Date: 07/10/19 Follow-up type: In-patient    Lavonia Drafts 07/10/2019, 11:33 AM

## 2019-07-11 LAB — CBC
HCT: 34.2 % — ABNORMAL LOW (ref 36.0–46.0)
Hemoglobin: 10.3 g/dL — ABNORMAL LOW (ref 12.0–15.0)
MCH: 22.3 pg — ABNORMAL LOW (ref 26.0–34.0)
MCHC: 30.1 g/dL (ref 30.0–36.0)
MCV: 74 fL — ABNORMAL LOW (ref 80.0–100.0)
Platelets: 257 10*3/uL (ref 150–400)
RBC: 4.62 MIL/uL (ref 3.87–5.11)
WBC: 9.1 10*3/uL (ref 4.0–10.5)
nRBC: 0 % (ref 0.0–0.2)

## 2019-07-11 LAB — VARICELLA ZOSTER ANTIBODY, IGG: Varicella IgG: <135 index — ABNORMAL LOW (ref >165)

## 2019-07-11 MED ORDER — IBUPROFEN 600 MG PO TABS: 600.0000 mg | ORAL_TABLET | Freq: Four times a day (QID) | ORAL | 0 refills | Status: DC | PRN

## 2019-07-11 MED ORDER — PRENATAL MULTIVITAMIN CH: 1.0000 | ORAL_TABLET | Freq: Every day | ORAL | Status: DC

## 2019-07-11 MED ORDER — ACETAMINOPHEN 325 MG PO TABS: 650.0000 mg | ORAL_TABLET | ORAL | Status: DC | PRN

## 2019-07-11 NOTE — Discharge Instructions (Signed)

## 2019-07-11 NOTE — Lactation Note (Signed)
This note was copied from a baby's chart. Mom and baby for d/c today, mom states she fed formula last at 0800am, states she is still planning on breastfeeding, she does not desire any assistance with breastfeeding at the present time, I wrote my Ascom # and name on the white board in room and encouraged mom to call for any concerns or questions, she states that she is not on [facility]

## 2019-07-11 NOTE — Progress Notes (Signed)
Patient discharged home with infant. Discharge instructions and prescriptions given and reviewed with patient. Patient verbalized understanding. Escorted out by auxillary.  

## 2019-07-11 NOTE — Anesthesia Postprocedure Evaluation (Signed)
Anesthesia Post Note  Patient: Pamela Cole  Procedure(s) Performed: AN AD [place]  Patient location during evaluation: Mother Baby Anesthesia Type: Epidural Level of consciousness: awake and alert Pain management: pain level controlled Vital Signs Assessment: post-procedure vital signs reviewed and stable Respiratory status: spontaneous breathing, nonlabored ventilation and respiratory function stable Cardiovascular status: stable Postop Assessment: no headache, no backache and epidural receding Anesthetic complications: no     Last Vitals:  Vitals:   07/10/19 1525 07/11/19 0007  BP: 100/70 93/60  Pulse: 90 75  Resp: 18 20  Temp: 37.2 C 37.1 C  SpO2: 98% 96%    Last Pain:  Vitals:   07/11/19 0246  TempSrc:   PainSc: Asleep                 Jerrye Noble

## 2019-07-13 NOTE — Progress Notes (Deleted)
[facility]  Telephone:([phone] Fax:([phone]  ID: Pamela Cole OB: Nov 30, 1995  MR#: [id number]  HFW#:[id number]  Patient Care Team: Department, [facility] as PCP - General  CHIEF COMPLAINT: Anemia in pregnancy.  INTERVAL HISTORY: Patient is a 23 year old female in her third trimester pregnancy who was found to have a declining hemoglobin and iron stores.  She had the same problem with a previous pregnancy 3 years ago.  She has increased weakness and fatigue as well as shortness of breath.  She also complains of intermittent abdominal pain.  She has no neurologic complaints.  She denies any recent fevers or illnesses.  She has a good appetite and is gaining weight appropriately.  She has no chest pain, cough, or hemoptysis.  She denies any nausea, vomiting, constipation, or diarrhea.  She has no melena or hematochezia.  She has no urinary complaints.  Patient otherwise feels well and offers no further specific complaints.  REVIEW OF SYSTEMS:   Review of Systems  Constitutional: Positive for malaise/fatigue. Negative for fever and weight loss.  Respiratory: Positive for shortness of breath. Negative for cough and hemoptysis.   Cardiovascular: Negative.  Negative for chest pain and leg swelling.  Gastrointestinal: Positive for abdominal pain. Negative for blood in stool and melena.  Genitourinary: Negative.  Negative for hematuria.  Musculoskeletal: Negative.  Negative for back pain.  Skin: Negative.  Negative for rash.  Neurological: Negative.  Negative for dizziness, focal weakness, weakness and headaches.  Psychiatric/Behavioral: Negative.  The patient is not nervous/anxious.     As per HPI. Otherwise, a complete review of systems is negative.  PAST MEDICAL HISTORY: Past Medical History:  Diagnosis Date   Anemia    Cholelithiasis    UTI (urinary tract infection)    Hx of UTI x 1    PAST SURGICAL HISTORY: Past Surgical  History:  Procedure Laterality Date   NO PAST SURGERIES     TOOTH EXTRACTION Left 01/07/2019    FAMILY HISTORY: Family History  Problem Relation Age of Onset   Hypertension Mother    Asthma Brother    Breast cancer Maternal Grandmother     ADVANCED DIRECTIVES (Y/N):  N  HEALTH MAINTENANCE: Social History   Tobacco Use   Smoking status: Never Smoker   Smokeless tobacco: Never Used  Substance Use Topics   Alcohol use: No   Drug use: No     Colonoscopy:  PAP:  Bone density:  Lipid panel:  No Known Allergies  Current Outpatient Medications  Medication Sig Dispense Refill   acetaminophen (TYLENOL) 325 MG tablet Take 2 tablets (650 mg total) by mouth every 4 (four) hours as needed for mild pain or moderate pain.     amoxicillin (AMOXIL) 500 MG capsule Take 2 capsules (1,000 mg total) by mouth 2 (two) times daily for 14 days. 56 capsule 0   ibuprofen (ADVIL) 600 MG tablet Take 1 tablet (600 mg total) by mouth every 6 (six) hours as needed for mild pain, moderate pain or cramping. 60 tablet 0   Prenatal Vit-Fe Fumarate-FA (PRENATAL MULTIVITAMIN) TABS tablet Take 1 tablet by mouth daily at 12 noon.     No current facility-administered medications for this visit.     OBJECTIVE: There were no vitals filed for this visit.   There is no height or weight on file to calculate BMI.    ECOG FS:0 - Asymptomatic  General: Well-developed, well-nourished, no acute distress. Eyes: Pink conjunctiva, anicteric sclera. HEENT: Normocephalic, moist  mucous membranes, clear oropharnyx. Lungs: Clear to auscultation bilaterally. Heart: Regular rate and rhythm. No rubs, murmurs, or gallops. Abdomen: Appears appropriate for gestational age. Musculoskeletal: No edema, cyanosis, or clubbing. Neuro: Alert, answering all questions appropriately. Cranial nerves grossly intact. Skin: No rashes or petechiae noted. Psych: Normal affect.  LAB RESULTS:  Lab Results  Component Value  Date   NA 134 (L) 07/09/2019   K 3.5 07/09/2019   CL 104 07/09/2019   CO2 18 (L) 07/09/2019   GLUCOSE 71 07/09/2019   BUN <5 (L) 07/09/2019   CREATININE <0.30 (L) 07/09/2019   CALCIUM 8.8 (L) 07/09/2019   PROT 6.7 07/09/2019   ALBUMIN 3.2 (L) 07/09/2019   AST 17 07/09/2019   ALT 12 07/09/2019   ALKPHOS 156 (H) 07/09/2019   BILITOT 0.4 07/09/2019   GFRNONAA NOT CALCULATED 07/09/2019   GFRAA NOT CALCULATED 07/09/2019    Lab Results  Component Value Date   WBC 9.1 07/11/2019   NEUTROABS 6.5 06/27/2019   HGB 10.3 (L) 07/11/2019   HCT 34.2 (L) 07/11/2019   MCV 74.0 (L) 07/11/2019   PLT 257 07/11/2019   Lab Results  Component Value Date   IRON 22 (L) 05/16/2019   TIBC 629 (HH) 05/16/2019   IRONPCTSAT 3 (LL) 05/16/2019   Lab Results  Component Value Date   FERRITIN 3 (L) 05/16/2019     STUDIES: [place] Renal  Result Date: 06/27/2019 CLINICAL DATA:  Patient is approximately [redacted] weeks pregnant. Right upper quadrant and right flank pain. Suspect pyelonephritis versus kidney stones. EXAM: RENAL / URINARY TRACT ULTRASOUND COMPLETE COMPARISON:  None. FINDINGS: Right Kidney: Renal measurements: 11.3 x 5.6 x 5.6 cm = volume: 184 mL . Echogenicity within normal limits. No mass or hydronephrosis visualized. Left Kidney: Renal measurements: 12.6 x 6.1 x 7.3 cm = volume: 291 mL. Echogenicity within normal limits. No mass or hydronephrosis visualized. Bladder: Decompressed and not well evaluated. Other: None. IMPRESSION: 1. Normal sonographic appearance of the kidneys. Electronically Signed   By: Amie Portlandavid  Ormond M.D.   On: 06/27/2019 14:36   Mr Abdomen Mrcp Wo Contrast  Result Date: 06/28/2019 CLINICAL DATA:  23 year old pregnant female inpatient at 4136 weeks gestational age admitted with 1 week of right upper quadrant abdominal pain, nausea and vomiting. Abnormal liver function tests. EXAM: MRI ABDOMEN WITHOUT CONTRAST  (INCLUDING MRCP) TECHNIQUE: Multiplanar multisequence MR imaging of the  abdomen was performed. Heavily T2-weighted images of the biliary and pancreatic ducts were obtained, and three-dimensional MRCP images were rendered by post processing. COMPARISON:  Right upper quadrant abdominal sonogram from earlier today. 03/31/2017 CT abdomen/pelvis. FINDINGS: Lower chest: No acute abnormality at the lung bases. Hepatobiliary: Normal liver size and configuration. Mild diffuse hepatic steatosis. No liver mass. No cholelithiasis. No gallbladder wall thickening. No pericholecystic fluid. Tiny 3 mm inferior gallbladder wall polyp as seen on sonogram from earlier today. No biliary ductal dilatation. Common bile duct diameter 3 mm. No evidence of choledocholithiasis. No biliary strictures or beading. Pancreas: No pancreatic mass or duct dilation.  No pancreas divisum. Spleen: Normal size. No mass. Adrenals/Urinary Tract: Normal adrenals. Mild bilateral pelvocaliectasis, left greater than right. Normal size kidneys. No renal masses. No perinephric edema. Stomach/Bowel: Normal non-distended stomach. Visualized small and large bowel is normal caliber, with no bowel wall thickening. Vascular/Lymphatic: Normal caliber abdominal aorta. No pathologically enlarged lymph nodes in the abdomen. Other: No abdominal ascites or focal fluid collection. Partially visualized enlarged gravid uterus with single visualized intrauterine gestation. This MRI study not tailored for fetal  evaluation. Musculoskeletal: No aggressive appearing focal osseous lesions. IMPRESSION: 1. Mild diffuse hepatic steatosis.  No liver masses. 2. No cholelithiasis.  No evidence of acute cholecystitis. 3. No biliary ductal dilatation. No evidence of choledocholithiasis. 4. Tiny 3 mm gallbladder polyp, as seen on sonogram from earlier today, compatible with benign cholesterol polyp, requiring no follow-up. 5. Mild bilateral pelvicaliectasis, left greater than right, more likely physiologic due to advanced gestational age given absence of  perinephric edema. Electronically Signed   By: Delbert Phenix M.D.   On: 06/28/2019 20:17   Mr 3d Recon At Scanner  Result Date: 06/28/2019 CLINICAL DATA:  23 year old pregnant female inpatient at [redacted] weeks gestational age admitted with 1 week of right upper quadrant abdominal pain, nausea and vomiting. Abnormal liver function tests. EXAM: MRI ABDOMEN WITHOUT CONTRAST  (INCLUDING MRCP) TECHNIQUE: Multiplanar multisequence MR imaging of the abdomen was performed. Heavily T2-weighted images of the biliary and pancreatic ducts were obtained, and three-dimensional MRCP images were rendered by post processing. COMPARISON:  Right upper quadrant abdominal sonogram from earlier today. 03/31/2017 CT abdomen/pelvis. FINDINGS: Lower chest: No acute abnormality at the lung bases. Hepatobiliary: Normal liver size and configuration. Mild diffuse hepatic steatosis. No liver mass. No cholelithiasis. No gallbladder wall thickening. No pericholecystic fluid. Tiny 3 mm inferior gallbladder wall polyp as seen on sonogram from earlier today. No biliary ductal dilatation. Common bile duct diameter 3 mm. No evidence of choledocholithiasis. No biliary strictures or beading. Pancreas: No pancreatic mass or duct dilation.  No pancreas divisum. Spleen: Normal size. No mass. Adrenals/Urinary Tract: Normal adrenals. Mild bilateral pelvocaliectasis, left greater than right. Normal size kidneys. No renal masses. No perinephric edema. Stomach/Bowel: Normal non-distended stomach. Visualized small and large bowel is normal caliber, with no bowel wall thickening. Vascular/Lymphatic: Normal caliber abdominal aorta. No pathologically enlarged lymph nodes in the abdomen. Other: No abdominal ascites or focal fluid collection. Partially visualized enlarged gravid uterus with single visualized intrauterine gestation. This MRI study not tailored for fetal evaluation. Musculoskeletal: No aggressive appearing focal osseous lesions. IMPRESSION: 1. Mild  diffuse hepatic steatosis.  No liver masses. 2. No cholelithiasis.  No evidence of acute cholecystitis. 3. No biliary ductal dilatation. No evidence of choledocholithiasis. 4. Tiny 3 mm gallbladder polyp, as seen on sonogram from earlier today, compatible with benign cholesterol polyp, requiring no follow-up. 5. Mild bilateral pelvicaliectasis, left greater than right, more likely physiologic due to advanced gestational age given absence of perinephric edema. Electronically Signed   By: Delbert Phenix M.D.   On: 06/28/2019 20:17   US Abdomen Limited Ruq  Result Date: 06/28/2019 CLINICAL DATA:  Right upper quadrant abdominal pain EXAM: ULTRASOUND ABDOMEN LIMITED RIGHT UPPER QUADRANT COMPARISON:  06/11/2019 FINDINGS: Gallbladder: Small subcentimeter echogenic nonshadowing foci along the gallbladder wall compatible with polyps. Normal wall thickness measures 1.7 mm. No definite gallstones. No Murphy's sign or pericholecystic fluid. No signs of cholecystitis. Common bile duct: Diameter: 3.9 mm Liver: Increased hepatic echogenicity compatible with background hepatic steatosis. Small area of fatty sparing along the gallbladder noted. No intrahepatic biliary dilatation or definite focal hepatic abnormality. Portal vein is patent on color Doppler imaging with normal direction of blood flow towards the liver. Other: Included imaging of the right kidney upper pole demonstrates distension of the collecting system compatible with pelviectasis versus mild hydronephrosis. This appears new compared to 06/11/2019. IMPRESSION: Incidental gallbladder polyps. Negative for gallstones or cholecystitis. No biliary dilatation or obstruction Hepatic steatosis Mild right kidney pelviectasis versus developing hydronephrosis. Electronically Signed   By: Judie Petit.  Shick M.D.   On: 06/28/2019 10:14    ASSESSMENT: Anemia in pregnancy  PLAN:    1. Anemia in pregnancy: Patient's hemoglobin and iron stores are significantly reduced and she is  symptomatic.  Return to clinic tomorrow for 510 mg IV Feraheme.  Patient then return to clinic in 2 weeks for second infusion.  Her due date is approximately July 24, 2019, therefore she will return to clinic the second week of December for further evaluation, repeat laboratory, and continuation of IV iron if necessary. 2.  Shortness of breath/weakness and fatigue: Likely secondary to iron deficiency anemia.  Treatment as above. 3.  Abdominal pain: Patient has been instructed to call her OB as soon as possible for evaluation. 4.  Pregnancy: Patient reports her due date is July 24, 2019.  Patient expressed understanding and was in agreement with this plan. She also understands that She can call clinic at any time with any questions, concerns, or complaints.    Lloyd Huger, MD   07/13/2019 7:21 AM

## 2019-07-14 ENCOUNTER — Ambulatory Visit: Payer: Self-pay

## 2019-07-15 ENCOUNTER — Inpatient Hospital Stay: Payer: Self-pay | Admitting: Oncology

## 2019-07-15 ENCOUNTER — Inpatient Hospital Stay: Payer: Self-pay

## 2019-08-21 ENCOUNTER — Other Ambulatory Visit: Payer: Self-pay

## 2019-08-21 ENCOUNTER — Ambulatory Visit (LOCAL_COMMUNITY_HEALTH_CENTER): Payer: Self-pay | Admitting: Family Medicine

## 2019-08-21 ENCOUNTER — Ambulatory Visit: Payer: Self-pay

## 2019-08-21 DIAGNOSIS — Z30017 Encounter for initial prescription of implantable subdermal contraceptive: Secondary | ICD-10-CM

## 2019-08-21 DIAGNOSIS — F53 Postpartum depression: Secondary | ICD-10-CM

## 2019-08-21 DIAGNOSIS — O99345 Other mental disorders complicating the puerperium: Secondary | ICD-10-CM

## 2019-08-21 LAB — HEMOGLOBIN, FINGERSTICK: Hemoglobin: 13 g/dL (ref 11.1–15.9)

## 2019-08-21 MED ORDER — ETONOGESTREL 68 MG ~~LOC~~ IMPL
68.0000 mg | DRUG_IMPLANT | Freq: Once | SUBCUTANEOUS | Status: AC
  Administered 2019-08-21: 68 mg via SUBCUTANEOUS

## 2019-08-21 NOTE — Progress Notes (Signed)
In for pp visit; desires Nexplanon; declines HIV/RPR testing Sharlette Dense, RN

## 2019-08-21 NOTE — Progress Notes (Signed)
Post Partum Exam  Pamela Cole is a 24 y.o. G80P3003 female who presents for a postpartum visit. She is 6 weeks postpartum following a spontaneous vaginal delivery. I have fully reviewed the prenatal and intrapartum course. The delivery was at [redacted]w[redacted]d gestational weeks.  Anesthesia: epidural. Postpartum course has been going well - no more abdominal pain that was present during pregnancy. Baby's course has been going well. Baby is feeding by Bottle. Bleeding: no bleeding. Bowel function is abnormal: some constipation. Bladder function is normal. Patient is not sexually active. Contraception method is none - desires nexplanon today.  Postpartum depression screening: EPDS score 9 Edinburgh Postnatal Depression Scale - 08/21/19 1512      Edinburgh Postnatal Depression Scale:  In the Past 7 Days   I have been able to laugh and see the funny side of things.  1    I have looked forward with enjoyment to things.  1    I have blamed myself unnecessarily when things went wrong.  0    I have been anxious or worried for no good reason.  1    I have felt scared or panicky for no good reason.  2    Things have been getting on top of me.  1    I have been so unhappy that I have had difficulty sleeping.  1    I have felt sad or miserable.  1    I have been so unhappy that I have been crying.  1    The thought of harming myself has occurred to me.  0    Edinburgh Postnatal Depression Scale Total  9        The following portions of the patient's history were reviewed and updated as appropriate: allergies, current medications, past family history, past medical history, past social history, past surgical history and problem list.  Last pap smear done 01/09/19 and was Abnormal- LSIL, needs repeat in 01/2020  Review of Systems A comprehensive review of systems was negative.    Objective:  BP 95/71   Ht 4\' 11"  (1.499 m)   Wt 143 lb 9.6 oz (65.1 kg)   Breastfeeding No Comment: No menses   BMI  29.00 kg/m   Gen: well appearing, NAD HEENT: no scleral icterus CV: RR Lung: Normal WOB Breast:performed-not indicated  Ext: warm well perfused  GU: n/a Rectal: performed -  not indicated       Assessment:    Normal postpartum exam. Pap smear not done at today's visit.   Plan:   1. Contraception: Contraception counseling: Reviewed all forms of birth control options in the tiered based approach. available including abstinence; over the counter/barrier methods; hormonal contraceptive medication including pill, patch, ring, injection,contraceptive implant; hormonal and nonhormonal IUDs; permanent sterilization options including vasectomy and the various tubal sterilization modalities. Risks, benefits, and typical effectiveness rates were reviewed.  Questions were answered.  Written information was also given to the patient to review.  Patient desires Nexplanon, this was prescribed for patient. She will follow up in  1 year for surveillance.  She was told to call with any further questions, or with any concerns about this method of contraception.  Emphasized use of condoms 100% of the time for STI prevention.  ECP n/a  Recommended delay in pregnancy for 18 months   2. Infant feeding:  patient is currently feeding with formula.  If breastmilk feeding patient was given letter for employer to provide appropriate pumping time to express breastmilk.   -  Recommended patient engage with WIC/BFpeer counselors  -Counseled to sign new child up for [facility] services 3. Mood: EPDS score is 9. Reviewed resources and that mood sx in first year after pregnancy are considered related to pregnancy and to reach out for help at [facility] if needed. Discussed [facility] as link to care and availability of LCSW for counseling  4. Chronic Medical Conditions:    1. Postpartum exam -Nexplanon to be placed at today's visit by Sadie Haber, PA. See separate documentation. -For constipation advised Metamucil, Colace, increased  fiber in diet, increased fluids, and walking.  - Hemoglobin, venipuncture  2. Depressed mood with postpartum onset -Discussed EDPS score of 9. Pt states she is doing ok, has mom as support, though does feel like she wants to be alone or will suddenly get sad. We discussed options of counseling or possibly starting medication. She declines both for now but knows to RTC if desires either. No si/hi but advised pt to go to ER if present.    Patient given handout about PCP care in the community Given MVI per family planning program guidelines and availability  Follow up in: 6 months for pap or sooner as needed.

## 2019-08-21 NOTE — Progress Notes (Signed)
Nexplanon Insertion Procedure Patient identified, informed consent performed, consent signed.   Patient does understand that irregular bleeding is a very common side effect of this medication. She was advised to have backup contraception after placement. Patient was determined to meet WHO criteria for not being pregnant. Appropriate time out taken.  The insertion site was identified 8-10 cm (3-4 inches) from the medial epicondyle of the humerus and 3-5 cm (1.25-2 inches) posterior to (below) the sulcus (groove) between the biceps and triceps muscles of the patient's Left arm and marked.  Patient was prepped with alcohol swab and then injected with 3 ml of 1% lidocaine.  Arm was prepped with chlorhexidene, Nexplanon removed from packaging,  Device confirmed in needle, then inserted full length of needle and withdrawn per handbook instructions. Nexplanon was able to palpated in the patient's arm; patient palpated the insert herself. There was minimal blood loss.  Patient insertion site covered with guaze and a pressure bandage to reduce any bruising.  The patient tolerated the procedure well and was given post procedure instructions.   Nexplanon:   Counseled patient to take OTC analgesic starting as soon as lidocaine starts to wear off and take regularly for at least 48 hr to decrease discomfort.  Specifically to take with food or milk to decrease stomach upset and for IB 600 mg (3 tablets) every 6 hrs; IB 800 mg (4 tablets) every 8 hrs; or Aleve 2 tablets every 12 hrs.

## 2019-08-22 ENCOUNTER — Telehealth: Payer: Self-pay | Admitting: Family Medicine

## 2019-08-22 ENCOUNTER — Encounter: Payer: Self-pay | Admitting: Family Medicine

## 2019-08-22 NOTE — Telephone Encounter (Signed)
TC with patient.  Reports whole arm is swollen including fingers from Nexplanon procedure.  States skin is hot and "feels like my veins are hurting".  Instructed patient to apply ice, take Ibuprofen and go to ER for eval.  Explained this is not a normal reaction from the procedure. Patient verbalized understanding Richmond Campbell, RN

## 2019-08-22 NOTE — Telephone Encounter (Signed)
NEXPLANON PROBLEM

## 2019-10-16 NOTE — Progress Notes (Deleted)
[place]  Telephone:([phone] [phone]  ID: Pamela Cole OB: 12-25-1995  MR#: [id number]  DGU#:[id number]  Patient Care Team: Department, [facility] as PCP - General  CHIEF COMPLAINT: Anemia in pregnancy.  INTERVAL HISTORY: Patient is a 24 year old female in her third trimester pregnancy who was found to have a declining hemoglobin and iron stores.  She had the same problem with a previous pregnancy 3 years ago.  She has increased weakness and fatigue as well as shortness of breath.  She also complains of intermittent abdominal pain.  She has no neurologic complaints.  She denies any recent fevers or illnesses.  She has a good appetite and is gaining weight appropriately.  She has no chest pain, cough, or hemoptysis.  She denies any nausea, vomiting, constipation, or diarrhea.  She has no melena or hematochezia.  She has no urinary complaints.  Patient otherwise feels well and offers no further specific complaints.  REVIEW OF SYSTEMS:   Review of Systems  Constitutional: Positive for malaise/fatigue. Negative for fever and weight loss.  Respiratory: Positive for shortness of breath. Negative for cough and hemoptysis.   Cardiovascular: Negative.  Negative for chest pain and leg swelling.  Gastrointestinal: Positive for abdominal pain. Negative for blood in stool and melena.  Genitourinary: Negative.  Negative for hematuria.  Musculoskeletal: Negative.  Negative for back pain.  Skin: Negative.  Negative for rash.  Neurological: Negative.  Negative for dizziness, focal weakness, weakness and headaches.  Psychiatric/Behavioral: Negative.  The patient is not nervous/anxious.     As per HPI. Otherwise, a complete review of systems is negative.  PAST MEDICAL HISTORY: Past Medical History:  Diagnosis Date  . Anemia   . Cholelithiasis   . UTI (urinary tract infection)    Hx of UTI x 1    PAST SURGICAL HISTORY: Past Surgical  History:  Procedure Laterality Date  . NO PAST SURGERIES    . TOOTH EXTRACTION Left 01/07/2019    FAMILY HISTORY: Family History  Problem Relation Age of Onset  . Hypertension Mother   . Asthma Brother   . Breast cancer Maternal Grandmother     ADVANCED DIRECTIVES (Y/N):  N  HEALTH MAINTENANCE: Social History   Tobacco Use  . Smoking status: Never Smoker  . Smokeless tobacco: Never Used  Substance Use Topics  . Alcohol use: No  . Drug use: No     Colonoscopy:  PAP:  Bone density:  Lipid panel:  No Known Allergies  Current Outpatient Medications  Medication Sig Dispense Refill  . acetaminophen (TYLENOL) 325 MG tablet Take 2 tablets (650 mg total) by mouth every 4 (four) hours as needed for mild pain or moderate pain.    Marland Kitchen ibuprofen (ADVIL) 600 MG tablet Take 1 tablet (600 mg total) by mouth every 6 (six) hours as needed for mild pain, moderate pain or cramping. 60 tablet 0  . Prenatal Vit-Fe Fumarate-FA (PRENATAL MULTIVITAMIN) TABS tablet Take 1 tablet by mouth daily at 12 noon.     No current facility-administered medications for this visit.    OBJECTIVE: There were no vitals filed for this visit.   There is no height or weight on file to calculate BMI.    ECOG FS:0 - Asymptomatic  General: Well-developed, well-nourished, no acute distress. Eyes: Pink conjunctiva, anicteric sclera. HEENT: Normocephalic, moist mucous membranes, clear oropharnyx. Lungs: Clear to auscultation bilaterally. Heart: Regular rate and rhythm. No rubs, murmurs, or gallops. Abdomen: Appears appropriate for gestational age. Musculoskeletal:  No edema, cyanosis, or clubbing. Neuro: Alert, answering all questions appropriately. Cranial nerves grossly intact. Skin: No rashes or petechiae noted. Psych: Normal affect.  LAB RESULTS:  Lab Results  Component Value Date   NA 134 (L) 07/09/2019   K 3.5 07/09/2019   CL 104 07/09/2019   CO2 18 (L) 07/09/2019   GLUCOSE 71 07/09/2019   BUN <5  (L) 07/09/2019   CREATININE <0.30 (L) 07/09/2019   CALCIUM 8.8 (L) 07/09/2019   PROT 6.7 07/09/2019   ALBUMIN 3.2 (L) 07/09/2019   AST 17 07/09/2019   ALT 12 07/09/2019   ALKPHOS 156 (H) 07/09/2019   BILITOT 0.4 07/09/2019   GFRNONAA NOT CALCULATED 07/09/2019   GFRAA NOT CALCULATED 07/09/2019    Lab Results  Component Value Date   WBC 9.1 07/11/2019   NEUTROABS 6.5 06/27/2019   HGB 10.3 (L) 07/11/2019   HCT 34.2 (L) 07/11/2019   MCV 74.0 (L) 07/11/2019   PLT 257 07/11/2019   Lab Results  Component Value Date   IRON 22 (L) 05/16/2019   TIBC 629 (HH) 05/16/2019   IRONPCTSAT 3 (LL) 05/16/2019   Lab Results  Component Value Date   FERRITIN 3 (L) 05/16/2019     STUDIES: No results found.  ASSESSMENT: Anemia in pregnancy  PLAN:    1. Anemia in pregnancy: Patient's hemoglobin and iron stores are significantly reduced and she is symptomatic.  Return to clinic tomorrow for 510 mg IV Feraheme.  Patient then return to clinic in 2 weeks for second infusion.  Her due date is approximately July 24, 2019, therefore she will return to clinic the second week of December for further evaluation, repeat laboratory, and continuation of IV iron if necessary. 2.  Shortness of breath/weakness and fatigue: Likely secondary to iron deficiency anemia.  Treatment as above. 3.  Abdominal pain: Patient has been instructed to call her OB as soon as possible for evaluation. 4.  Pregnancy: Patient reports her due date is July 24, 2019.  Patient expressed understanding and was in agreement with this plan. She also understands that She can call clinic at any time with any questions, concerns, or complaints.    Jeralyn Ruths, MD   10/16/2019 4:42 PM

## 2019-10-20 ENCOUNTER — Encounter: Payer: Self-pay | Admitting: Oncology

## 2019-10-20 ENCOUNTER — Inpatient Hospital Stay: Payer: Self-pay

## 2019-10-20 ENCOUNTER — Inpatient Hospital Stay: Payer: Self-pay | Admitting: Oncology

## 2019-11-28 NOTE — Telephone Encounter (Signed)
Read and agree with consult 

## 2020-01-29 IMAGING — MR MR MRCP
10 of 14 series · 32 of 48 positions shown · non-contrast
Comparison: Right upper quadrant abdominal sonogram from earlier
today. 03/31/2017 CT abdomen/pelvis.

CLINICAL DATA: 23-year-old pregnant female inpatient at 36 weeks
gestational age admitted with 1 week of right upper quadrant
abdominal pain, nausea and vomiting. Abnormal liver function tests.

EXAM:
MRI ABDOMEN WITHOUT CONTRAST  (INCLUDING MRCP)
TECHNIQUE: Multiplanar multisequence MR imaging of the abdomen was performed.
Heavily T2-weighted images of the biliary and pancreatic ducts were
obtained, and three-dimensional MRCP images were rendered by post
processing.

[Series 2: bSSFP · coronal · 6.0mm · 0.74mm/px · 2 of 26 slices shown]
[im 1/26]
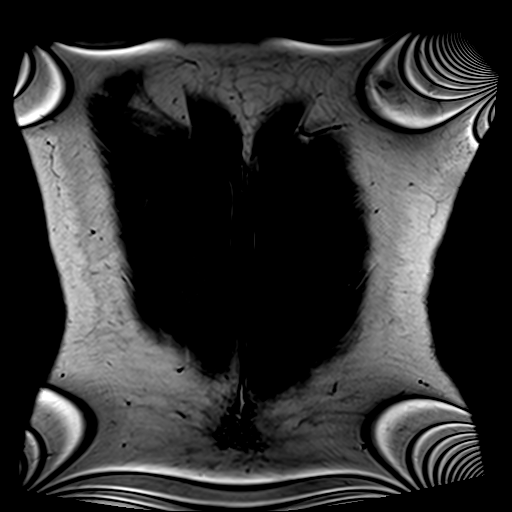
[im 26/26]
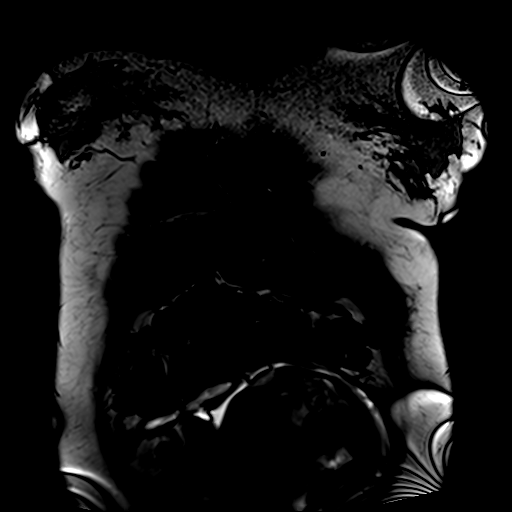

[Series 3: T2 · axial · 6.0mm · 1.19mm/px · z∈[-217,+21]mm · 3 of 34 slices shown]
[im 1/34]
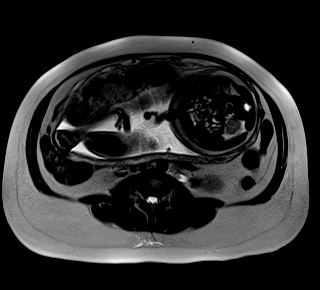
[im 17/34]
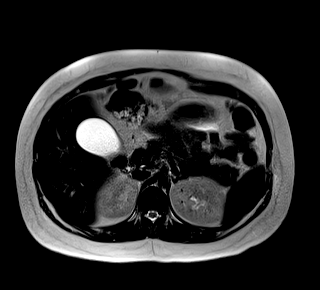
[im 34/34]
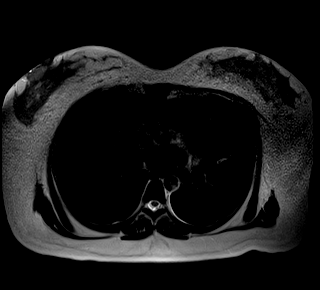

[Series 4: T1 · axial · 6.0mm · 0.74mm/px · z∈[-217,+21]mm · 3 of 34 slices shown (1 of 2)]
[im 1/34]
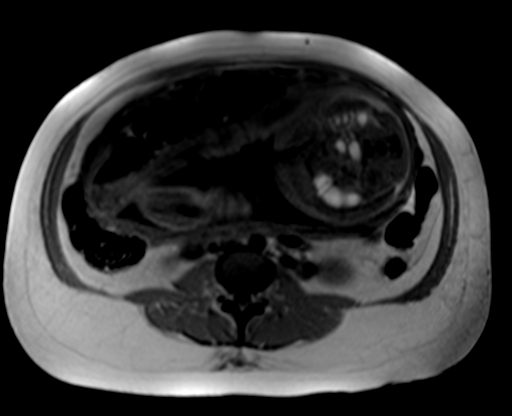
[im 17/34]
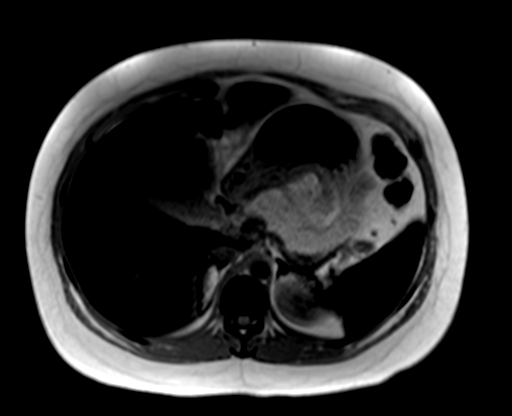
[im 34/34]
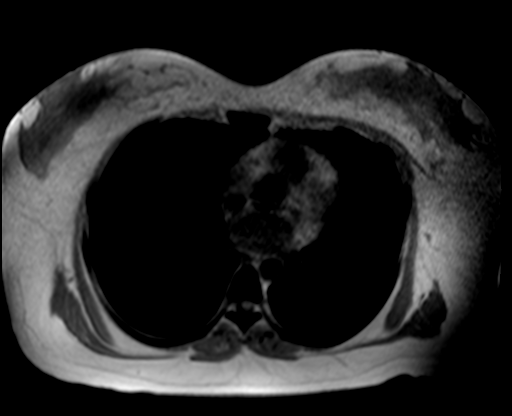

[Series 4: T1 · axial · 6.0mm · 0.74mm/px · z∈[-217,+21]mm · 3 of 34 slices shown (2 of 2)]
[im 1/34]
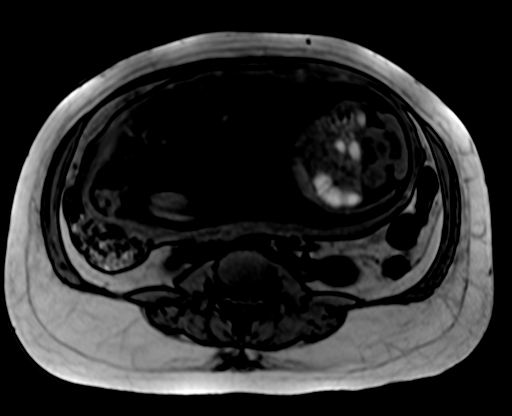
[im 17/34]
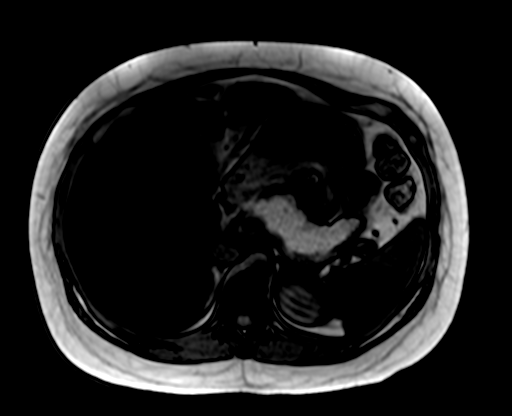
[im 34/34]
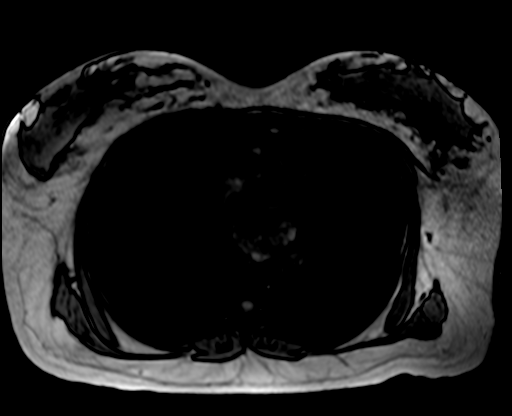

[Series 7: T2 fat-sat · axial · 6.0mm · 1.19mm/px · z∈[-195,+43]mm · 3 of 34 slices shown]
[im 1/34]
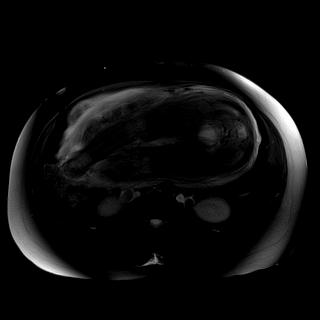
[im 17/34]
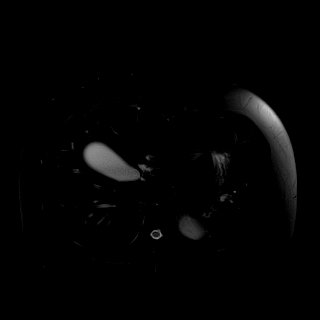
[im 34/34]
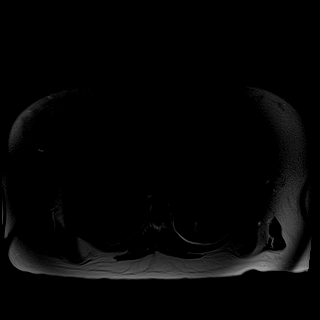

[Series 8: ax dwi_tracew · axial · 6.0mm · 1.42mm/px · z∈[-216,+51]mm · 4 of 38 slices shown (1 of 3)]
[im 1/38]
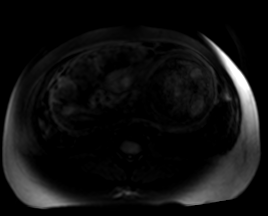
[im 13/38]
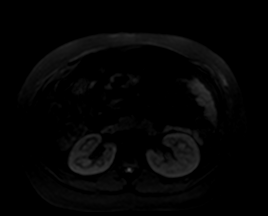
[im 25/38]
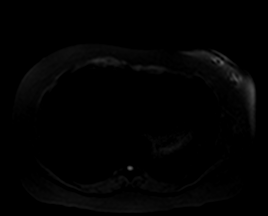
[im 38/38]
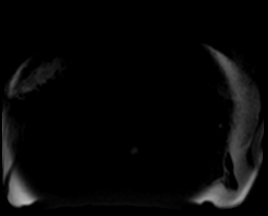

[Series 8: ax dwi_tracew · axial · 6.0mm · 1.42mm/px · z∈[-216,+51]mm · 4 of 38 slices shown (2 of 3)]
[im 1/38]
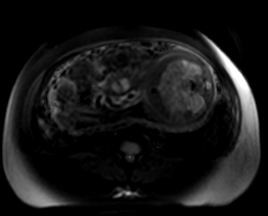
[im 13/38]
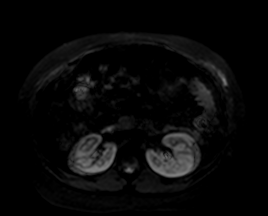
[im 25/38]
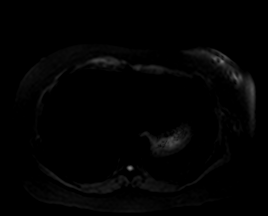
[im 38/38]
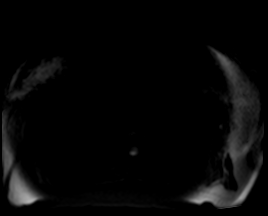

[Series 8: ax dwi_tracew · axial · 6.0mm · 1.42mm/px · z∈[-216,+51]mm · 4 of 38 slices shown (3 of 3)]
[im 1/38]
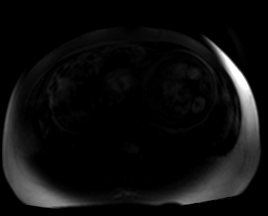
[im 13/38]
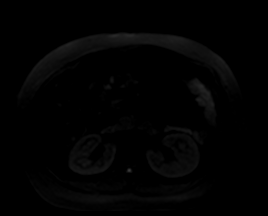
[im 25/38]
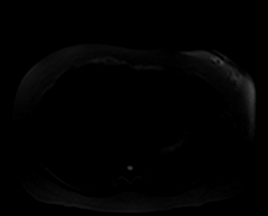
[im 38/38]
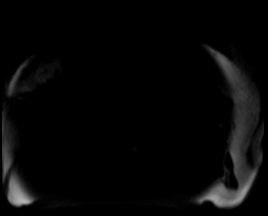

[Series 9: ax dwi_adc · axial · 6.0mm · 1.42mm/px · z∈[-216,+51]mm · 4 of 38 slices shown]
[im 1/38]
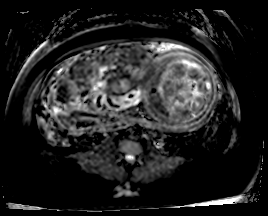
[im 13/38]
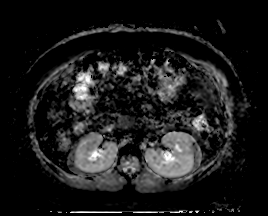
[im 25/38]
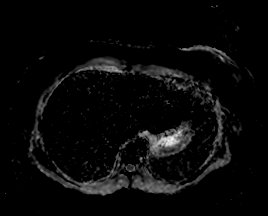
[im 38/38]
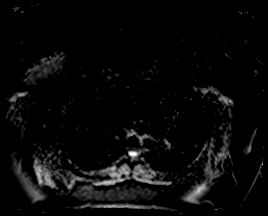

[Series 15: t2_space_cor_cs24_bh_384 · coronal · 1.2mm · 0.47mm/px · 2 of 64 slices shown]
[im 1/64]
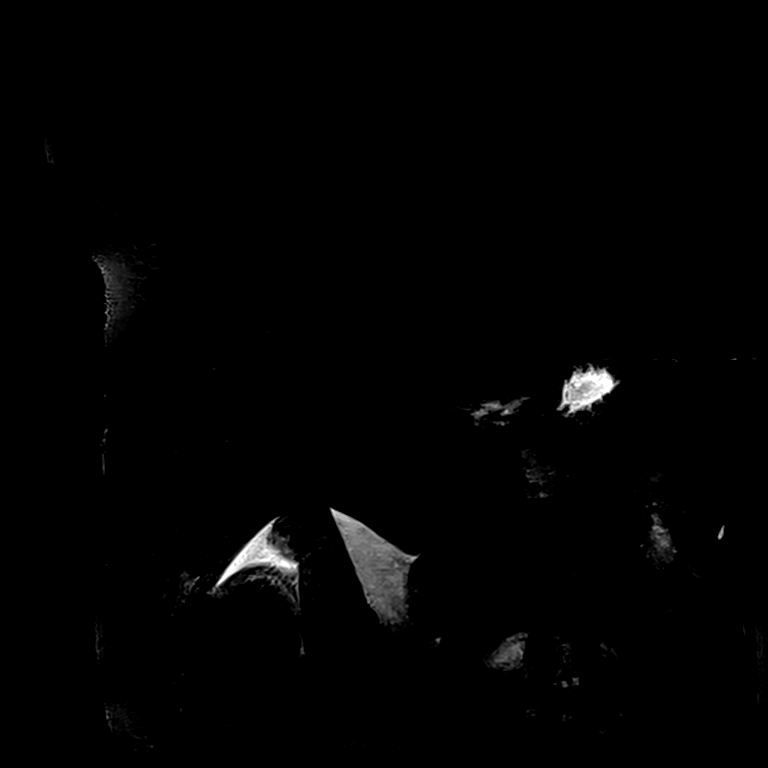
[im 13/64]
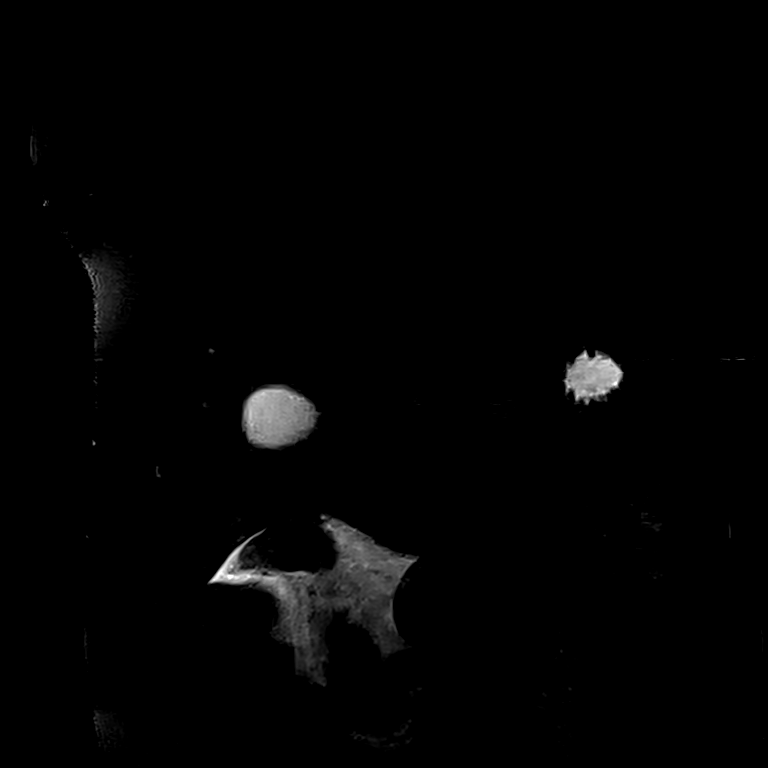

[32 of 48 positions shown; findings below may reference images not displayed]

FINDINGS: Lower chest: No acute abnormality at the lung bases.

Hepatobiliary: Normal liver size and configuration. Mild diffuse
hepatic steatosis. No liver mass. No cholelithiasis. No gallbladder
wall thickening. No pericholecystic fluid. Tiny 3 mm inferior
gallbladder wall polyp as seen on sonogram from earlier today. No
biliary ductal dilatation. Common bile duct diameter 3 mm. No
evidence of choledocholithiasis. No biliary strictures or beading.

Pancreas: No pancreatic mass or duct dilation.  No pancreas divisum.

Spleen: Normal size. No mass.

Adrenals/Urinary Tract: Normal adrenals. Mild bilateral
pelvocaliectasis, left greater than right. Normal size kidneys. No
renal masses. No perinephric edema.

Stomach/Bowel: Normal non-distended stomach. Visualized small and
large bowel is normal caliber, with no bowel wall thickening.

Vascular/Lymphatic: Normal caliber abdominal aorta. No
pathologically enlarged lymph nodes in the abdomen.

Other: No abdominal ascites or focal fluid collection. Partially
visualized enlarged gravid uterus with single visualized
intrauterine gestation. This MRI study not tailored for fetal
evaluation.

Musculoskeletal: No aggressive appearing focal osseous lesions.
IMPRESSION: 1. Mild diffuse hepatic steatosis.  No liver masses.
2. No cholelithiasis.  No evidence of acute cholecystitis.
3. No biliary ductal dilatation. No evidence of choledocholithiasis.
4. Tiny 3 mm gallbladder polyp, as seen on sonogram from earlier
today, compatible with benign cholesterol polyp, requiring no
follow-up.
5. Mild bilateral pelvicaliectasis, left greater than right, more
likely physiologic due to advanced gestational age given absence of
perinephric edema.

## 2020-02-06 ENCOUNTER — Telehealth: Payer: Self-pay

## 2020-02-06 NOTE — Telephone Encounter (Signed)
Spoke to patient today to schedule her Physical and Repeat PAP (due 01/2020).  Appointment is 02/10/2020 at 10:05 (arrival time is 9:35am).  Jairo Ben assisted with scheduling the appointment in Epic.  Hart Carwin, RN

## 2020-02-10 ENCOUNTER — Ambulatory Visit: Payer: Self-pay

## 2020-02-13 ENCOUNTER — Ambulatory Visit (LOCAL_COMMUNITY_HEALTH_CENTER): Payer: Self-pay | Admitting: Physician Assistant

## 2020-02-13 ENCOUNTER — Other Ambulatory Visit: Payer: Self-pay

## 2020-02-13 ENCOUNTER — Ambulatory Visit: Payer: Self-pay

## 2020-02-13 VITALS — BP 112/77 | Ht 60.0 in | Wt 156.0 lb

## 2020-02-13 DIAGNOSIS — Z113 Encounter for screening for infections with a predominantly sexual mode of transmission: Secondary | ICD-10-CM

## 2020-02-13 DIAGNOSIS — Z3009 Encounter for other general counseling and advice on contraception: Secondary | ICD-10-CM

## 2020-02-13 DIAGNOSIS — Z01419 Encounter for gynecological examination (general) (routine) without abnormal findings: Secondary | ICD-10-CM

## 2020-02-13 LAB — WET PREP FOR TRICH, YEAST, CLUE
Trichomonas Exam: NEGATIVE
Yeast Exam: NEGATIVE

## 2020-02-13 NOTE — Progress Notes (Signed)
Wet mount reviewed with provider and is negative today, so no treatment needed for wet mount per standing order and per Sadie Haber, PA verbal order. Awaiting TR's. Counseled pt per provider orders and pt states understanding. Provider orders completed.

## 2020-02-14 ENCOUNTER — Encounter: Payer: Self-pay | Admitting: Physician Assistant

## 2020-02-14 NOTE — Progress Notes (Signed)
[facility] problem [facility]  [facility]  Subjective:  Pamela Cole is a 24 y.o. being seen today for repeat pap.  Chief Complaint  Patient presents with  . 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   Patient requests testing for infections today because she has noticed occasional increase in vaginal discharge without other symptoms.   Patient also reports that she has been having dizziness, headaches, eye twitching and rectal bleeding.  States that she was having rectal bleeding during pregnancy and has had it once or twice since delivery but denies associated s/s, certain foods and constipation.  Reports that the headaches are frontal, has dizziness when moving around during the headaches and sometimes one of her eyes with twitch.  Denies changes to diet, added stressors and other associated symptoms.  Does report that she has been having the headaches mid month, which is when she had been having her periods before she got the Nexplanon.  Patient is considering having the Nexplanon removed since she associates the headaches with having the device placed.  States that she would likely change to Depo, but would probably continue to have the same symptoms.  Is not currently ready to have the removal but states that she will call for an appointment if she decides that is what she wants to do.     Does the patient have a current or past history of drug use? No   No components found for: HCV]   Health Maintenance Due  Topic Date Due  . Hepatitis C Screening  Never done  . COVID-19 Vaccine (1) Never done  . CHLAMYDIA SCREENING  Never done  . PAP SMEAR-Modifier  01/09/2020    Review of Systems  All other systems reviewed and are negative.   The following portions of the patient's history were reviewed and updated as appropriate: allergies, current medications, past family  history, past medical history, past social history, past surgical history and problem list. Problem list updated.   See flowsheet for other program required questions.  Objective:   Vitals:   02/13/20 0945  BP: 112/77  Weight: 156 lb (70.8 kg)  Height: 5' (1.524 m)    Physical Exam Vitals reviewed.  Constitutional:      General: She is not in acute distress.    Appearance: Normal appearance.  HENT:     Head: Normocephalic and atraumatic.  Pulmonary:     Effort: Pulmonary effort is normal.  Abdominal:     Palpations: Abdomen is soft. There is no mass.     Tenderness: There is no abdominal tenderness. There is no guarding or rebound.  Genitourinary:    General: Normal vulva.     Rectum: Normal.     Comments: External genitalia/pubic area without nits, lice, edema, erythema, lesions and inguinal adenopathy. Vagina with normal mucosa and discharge. Cervix without visible lesions. Uterus firm, mobile, nt, no masses, no CMT, no adnexal tenderness or fullness. Musculoskeletal:     Comments: Left upper arm with Nexplanon easilly palpable.  Skin:    General: Skin is warm and dry.     Findings: No bruising, erythema, lesion or rash.  Neurological:     Mental Status: She is alert and oriented to person, place, and time.  Psychiatric:        Mood and Affect: Mood normal.  Behavior: Behavior normal.        Thought Content: Thought content normal.        Judgment: Judgment normal.       Assessment and Plan:  Pamela Cole is a 24 y.o. female presenting to the [facility] Department for a Women's Health problem visit  1. Encounter for counseling regarding contraception Counseled patient re:  Normal SE of Nexplanon and Depo. Counseled that many people on Depo, Nexplanon and IUD may still have symptoms related to having a period without having the bleeding and that she can try taking OTC analgesic such as IB or Aleve regularly for 2-5 days around  the time of the month that she usually has her symptoms to see if this will reduce or eliminate them. Enc patient to keep a headache diary to see if she can identify triggers for her headaches that she could avoid to reduce headache frequency. Enc to establish with PCP for further evaluation for rectal bleeding and headaches.  2. Pap smear, as part of routine gynecological examination Await results.  Counseled that RN will call or send letter once results are back about follow up plan. 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  Counseled that since it is not a normal finding, we repeat her pap to make sure that the cells go back to normal.  Counseled that usually the cells return to normal but if they do not, she would be referred for further follow up . - Pap IG (Image Guided)  3. Screening for STD (sexually transmitted disease) Await test results.  Counseled that RN will call if needs to RTC for treatment once results are back.  - WET PREP FOR TRICH, YEAST, CLUE - Chlamydia/Gonorrhea [facility]     Return for 2-3 weeks for TR's, annual and PRN.  No future appointments.  Matt Holmes, PA

## 2020-02-17 LAB — PAP IG (IMAGE GUIDED): PAP Smear Comment: 0

## 2020-08-07 HISTORY — PX: CHOLECYSTECTOMY: SHX55

## 2021-02-19 ENCOUNTER — Other Ambulatory Visit: Payer: Self-pay

## 2021-02-19 ENCOUNTER — Emergency Department
Admission: EM | Admit: 2021-02-19 | Discharge: 2021-02-19 | Disposition: A | Discharge status: Home / Self Care | Payer: Self-pay | Attending: Emergency Medicine | Admitting: Emergency Medicine

## 2021-02-19 ENCOUNTER — Emergency Department: Payer: Self-pay

## 2021-02-19 DIAGNOSIS — K8020 Calculus of gallbladder without cholecystitis without obstruction: Secondary | ICD-10-CM | POA: Insufficient documentation

## 2021-02-19 DIAGNOSIS — B9689 Other specified bacterial agents as the cause of diseases classified elsewhere: Secondary | ICD-10-CM | POA: Insufficient documentation

## 2021-02-19 DIAGNOSIS — Z20822 Contact with and (suspected) exposure to covid-19: Secondary | ICD-10-CM | POA: Insufficient documentation

## 2021-02-19 DIAGNOSIS — N3000 Acute cystitis without hematuria: Secondary | ICD-10-CM | POA: Insufficient documentation

## 2021-02-19 DIAGNOSIS — R1011 Right upper quadrant pain: Secondary | ICD-10-CM

## 2021-02-19 LAB — CBC
HCT: 42.3 % (ref 36.0–46.0)
Hemoglobin: 13.4 g/dL (ref 12.0–15.0)
MCH: 23.9 pg — ABNORMAL LOW (ref 26.0–34.0)
MCHC: 31.7 g/dL (ref 30.0–36.0)
MCV: 75.5 fL — ABNORMAL LOW (ref 80.0–100.0)
Platelets: 254 10*3/uL (ref 150–400)
RBC: 5.6 MIL/uL — ABNORMAL HIGH (ref 3.87–5.11)
RDW: 14.8 % (ref 11.5–15.5)
WBC: 13.3 10*3/uL — ABNORMAL HIGH (ref 4.0–10.5)
nRBC: 0 % (ref 0.0–0.2)

## 2021-02-19 LAB — COMPREHENSIVE METABOLIC PANEL
ALT: 38 U/L (ref 0–44)
AST: 28 U/L (ref 15–41)
Albumin: 4.3 g/dL (ref 3.5–5.0)
Alkaline Phosphatase: 82 U/L (ref 38–126)
Anion gap: 7 (ref 5–15)
BUN: 6 mg/dL (ref 6–20)
CO2: 22 mmol/L (ref 22–32)
Calcium: 9.2 mg/dL (ref 8.9–10.3)
Chloride: 106 mmol/L (ref 98–111)
Creatinine, Ser: 0.58 mg/dL (ref 0.44–1.00)
GFR, Estimated: >60 mL/min (ref >60)
Glucose, Bld: 133 mg/dL — ABNORMAL HIGH (ref 70–99)
Potassium: 3.5 mmol/L (ref 3.5–5.1)
Sodium: 135 mmol/L (ref 135–145)
Total Bilirubin: 0.6 mg/dL (ref 0.3–1.2)
Total Protein: 8.3 g/dL — ABNORMAL HIGH (ref 6.5–8.1)

## 2021-02-19 LAB — URINALYSIS, COMPLETE (UACMP) WITH MICROSCOPIC
Bilirubin Urine: NEGATIVE
Glucose, UA: NEGATIVE mg/dL
Ketones, ur: NEGATIVE mg/dL
Nitrite: NEGATIVE
Protein, ur: NEGATIVE mg/dL
Specific Gravity, Urine: 1.014 (ref 1.005–1.030)
WBC, UA: >50 WBC/hpf — ABNORMAL HIGH (ref 0–5)
pH: 7 (ref 5.0–8.0)

## 2021-02-19 LAB — POC URINE PREG, ED: Preg Test, Ur: NEGATIVE

## 2021-02-19 LAB — LACTIC ACID, PLASMA: Lactic Acid, Venous: 1.2 mmol/L (ref 0.5–1.9)

## 2021-02-19 LAB — LIPASE, BLOOD: Lipase: 28 U/L (ref 11–51)

## 2021-02-19 LAB — RESP PANEL BY RT-PCR (FLU A&B, COVID) ARPGX2
Influenza A by PCR: NEGATIVE
Influenza B by PCR: NEGATIVE
SARS Coronavirus 2 by RT PCR: NEGATIVE

## 2021-02-19 LAB — MAGNESIUM: Magnesium: 2.1 mg/dL (ref 1.7–2.4)

## 2021-02-19 MED ORDER — LACTATED RINGERS IV BOLUS
1000.0000 mL | Freq: Once | INTRAVENOUS | Status: AC
  Administered 2021-02-19: 1000 mL via INTRAVENOUS

## 2021-02-19 MED ORDER — ONDANSETRON HCL 4 MG/2ML IJ SOLN
4.0000 mg | Freq: Once | INTRAMUSCULAR | Status: AC
  Administered 2021-02-19: 4 mg via INTRAVENOUS
  Filled 2021-02-19: qty 2

## 2021-02-19 MED ORDER — MORPHINE SULFATE (PF) 4 MG/ML IV SOLN
4.0000 mg | Freq: Once | INTRAVENOUS | Status: AC
  Administered 2021-02-19: 4 mg via INTRAVENOUS
  Filled 2021-02-19: qty 1

## 2021-02-19 MED ORDER — CEPHALEXIN 500 MG PO CAPS: 500.0000 mg | ORAL_CAPSULE | Freq: Four times a day (QID) | ORAL | 0 refills | Status: AC

## 2021-02-19 MED ORDER — ONDANSETRON 4 MG PO TBDP: 4.0000 mg | ORAL_TABLET | Freq: Three times a day (TID) | ORAL | 0 refills | Status: DC | PRN

## 2021-02-19 MED ORDER — OXYCODONE HCL 5 MG PO TABS: 5.0000 mg | ORAL_TABLET | Freq: Three times a day (TID) | ORAL | 0 refills | Status: AC | PRN

## 2021-02-19 MED ORDER — SODIUM CHLORIDE 0.9 % IV SOLN
1.0000 g | Freq: Once | INTRAVENOUS | Status: AC
  Administered 2021-02-19: 1 g via INTRAVENOUS
  Filled 2021-02-19: qty 10

## 2021-02-19 NOTE — ED Notes (Signed)
Pt informed this tech she went to ER in [facility] yesterday and was diagnosed with Gall stones.

## 2021-02-19 NOTE — ED Notes (Signed)
Lab called to add on magnesium  

## 2021-02-19 NOTE — Discharge Instructions (Addendum)
Use Tylenol for pain and fevers.  Up to 1000 mg per dose, up to 4 times per day.  Do not take more than 4000 mg of Tylenol/acetaminophen within 24 hours..  Use naproxen/Aleve for anti-inflammatory pain relief. Use up to 500mg  every 12 hours. Do not take more frequently than this. Do not use other NSAIDs (ibuprofen, Advil) while taking this medication. It is safe to take Tylenol with this.   Use Keflex antibiotic , 4 times daily for the next 7 days to treat a bladder infection/UTI.  Please finish all 20 tablets, even if your symptoms are improving.  Use Zofran as needed for any nausea.  Use oxycodone pain medications for breakthrough/severe pain if the above regimen is not working.  Follow-up with the surgeons this week to discuss having her gallbladder removed electively.  If you develop any further worsening symptoms despite all these measures, please return to the ED.

## 2021-02-19 NOTE — ED Triage Notes (Signed)
Pt to ED POV for upper abd pain, dx with gallstones at [facility] ER yesterday, referred to follow up with surgery on Monday.  Has been taking tylenol and motrin with no relief. Pt in NAD Pt states she had sweats and chills in the middle of the night, afebrile on arrival

## 2021-02-19 NOTE — ED Provider Notes (Signed)
[facility] Emergency Department Provider Note ____________________________________________   Event Date/Time   First MD Initiated Contact with Patient 02/19/21 [phone]     (approximate)  I have reviewed the triage vital signs and the nursing notes.  HISTORY  Chief Complaint Abdominal Pain   HPI Pamela Cole is a 25 y.o. femalewho presents to the ED for evaluation of abd pain.   Chart review indicates pt was seen yesterday at [place] ED for abd pain, found to have evidence of cholelithiasis without cholecystitis.  She reports having surgical follow-up on Monday, 2 days.  Patient reports having increased abdominal pain, subjective fevers and chills such that she could not sleep last night, so she presents to the ED for evaluation of worsening pain.  She reports nausea without emesis.  Denies stool changes, dysuria.  Currently reporting slight improvement of pain after morphine, now 5/10 intensity. Denies dysuria, but does report having urinary frequency, sensation of incomplete emptying.  Past Medical History:  Diagnosis Date   Anemia    Cholelithiasis    UTI (urinary tract infection)    Hx of UTI x 1    Patient Active Problem List   Diagnosis Date Noted   H. pylori infection 06/25/2019   History of abnormal cervical Pap smear 06/25/2019   Iron deficiency anemia 09/24/2016   Anemia affecting pregnancy in third trimester 04/17/2016    Past Surgical History:  Procedure Laterality Date   NO PAST SURGERIES     TOOTH EXTRACTION Left 01/07/2019    Prior to Admission medications   Medication Sig Start Date End Date Taking? Authorizing Provider  cephALEXin (KEFLEX) 500 MG capsule Take 1 capsule (500 mg total) by mouth 4 (four) times daily for 7 days. 02/19/21 02/26/21 Yes Delton Prairie, MD  ondansetron (ZOFRAN ODT) 4 MG disintegrating tablet Take 1 tablet (4 mg total) by mouth every 8 (eight) hours as needed for nausea or vomiting. 02/19/21   Yes Delton Prairie, MD  oxyCODONE (ROXICODONE) 5 MG immediate release tablet Take 1 tablet (5 mg total) by mouth every 8 (eight) hours as needed. 02/19/21 02/19/22 Yes Delton Prairie, MD  acetaminophen (TYLENOL) 325 MG tablet Take 2 tablets (650 mg total) by mouth every 4 (four) hours as needed for mild pain or moderate pain. 07/11/19   Genia Del, CNM  ibuprofen (ADVIL) 600 MG tablet Take 1 tablet (600 mg total) by mouth every 6 (six) hours as needed for mild pain, moderate pain or cramping. 07/11/19   Genia Del, CNM  Prenatal Vit-Fe Fumarate-FA (PRENATAL MULTIVITAMIN) TABS tablet Take 1 tablet by mouth daily at 12 noon. 07/12/19   Genia Del, CNM    Allergies Patient has no known allergies.  Family History  Problem Relation Age of Onset   Hypertension Mother    Asthma Brother    Breast cancer Maternal Grandmother     Social History Social History   Tobacco Use   Smoking status: Never   Smokeless tobacco: Never  Vaping Use   Vaping Use: Never used  Substance Use Topics   Alcohol use: No   Drug use: No    Review of Systems  Constitutional: No fever/chills Eyes: No visual changes. ENT: No sore throat. Cardiovascular: Denies chest pain. Respiratory: Denies shortness of breath. Gastrointestinal: Positive for abdominal pain and nausea  no vomiting.  No diarrhea.  No constipation. Genitourinary: Negative for dysuria. Positive for urinary frequency, sensation of incomplete emptying Musculoskeletal: Negative for back pain. Skin: Negative for rash. Neurological: Negative  for headaches, focal weakness or numbness. ____________________________________________   PHYSICAL EXAM:  VITAL SIGNS: Vitals:   02/19/21 1230 02/19/21 1300  BP: 90/60 93/60  Pulse: 87 84  Resp:  15  Temp:    SpO2: 99% 100%    Constitutional: Alert and oriented.  Appears uncomfortable without distress. Eyes: Conjunctivae are normal. PERRL. EOMI. Head: Atraumatic. Nose: No  congestion/rhinnorhea. Mouth/Throat: Mucous membranes are moist.  Oropharynx non-erythematous. Neck: No stridor. No cervical spine tenderness to palpation. Cardiovascular: Cardiac rate, regular rhythm. Grossly normal heart sounds.  Good peripheral circulation. Respiratory: Normal respiratory effort.  No retractions. Lungs CTAB. Gastrointestinal: Soft , nondistended.  Diffuse and mild tenderness, most pronounced to the suprapubic abdomen with some voluntary guarding.  She does have RUQ tenderness, but no peritoneal features or guarding at this site. No CVA tenderness is noted Musculoskeletal: No lower extremity tenderness nor edema.  No joint effusions. No signs of acute trauma. Neurologic:  Normal speech and language. No gross focal neurologic deficits are appreciated. No gait instability noted. Skin:  Skin is warm, dry and intact. No rash noted. Psychiatric: Mood and affect are normal. Speech and behavior are normal. ____________________________________________   LABS (all labs ordered are listed, but only abnormal results are displayed)  Labs Reviewed  COMPREHENSIVE METABOLIC PANEL - Abnormal; Notable for the following components:      Result Value   Glucose, Bld 133 (*)    Total Protein 8.3 (*)    All other components within normal limits  CBC - Abnormal; Notable for the following components:   WBC 13.3 (*)    RBC 5.60 (*)    MCV 75.5 (*)    MCH 23.9 (*)    All other components within normal limits  URINALYSIS, COMPLETE (UACMP) WITH MICROSCOPIC - Abnormal; Notable for the following components:   Color, Urine YELLOW (*)    APPearance CLOUDY (*)    Hgb urine dipstick SMALL (*)    Leukocytes,Ua LARGE (*)    WBC, UA >50 (*)    Bacteria, UA MANY (*)    All other components within normal limits  RESP PANEL BY RT-PCR (FLU A&B, COVID) ARPGX2  URINE CULTURE  LIPASE, BLOOD  LACTIC ACID, PLASMA  MAGNESIUM  POC URINE PREG, ED   ____________________________________________  12  Lead EKG  Sinus tachycardia, rate of 134 bpm, normal axis.  Prolonged QTC at 561 ms.  No STEMI ____________________________________________  RADIOLOGY  ED MD interpretation: RUQ ultrasound reviewed by me without evidence of acute cholecystitis  Official radiology report(s): US ABDOMEN LIMITED RUQ (LIVER/GB)  Result Date: 02/19/2021 CLINICAL DATA:  Right upper quadrant abdominal pain for the past month. EXAM: ULTRASOUND ABDOMEN LIMITED RIGHT UPPER QUADRANT COMPARISON:  06/28/2019.  Abdomen and pelvis CT dated 03/31/2017. FINDINGS: Gallbladder: Minimal dependent sludge and possible 3 mm gallstone. No gallbladder wall thickening or pericholecystic fluid. No sonographic Murphy sign. Common bile duct: Diameter: 3.1 mm Liver: Diffusely echogenic and mildly heterogeneous. Corresponding diffuse low density on the previous CT. Portal vein is patent on color Doppler imaging with normal direction of blood flow towards the liver. Other: None. IMPRESSION: 1. No acute abnormality. 2. Minimal sludge and possible 3 mm gallstone in the gallbladder. 3. Diffuse hepatic steatosis. Electronically Signed   By: Beckie Salts M.D.   On: 02/19/2021 11:43    ____________________________________________   PROCEDURES and INTERVENTIONS  Procedure(s) performed (including Critical Care):  .1-3 Lead EKG Interpretation  Date/Time: 02/19/2021 10:46 AM Performed by: Delton Prairie, MD Authorized by: Delton Prairie, MD  Interpretation: abnormal     ECG rate:  120   ECG rate assessment: tachycardic     Rhythm: sinus tachycardia     Ectopy: none     Conduction: normal    Medications  lactated ringers bolus 1,000 mL (0 mLs Intravenous Stopped 02/19/21 1154)  morphine 4 MG/ML injection 4 mg (4 mg Intravenous Given 02/19/21 0952)  cefTRIAXone (ROCEPHIN) 1 g in sodium chloride 0.9 % 100 mL IVPB (0 g Intravenous Stopped 02/19/21 1240)  lactated ringers bolus 1,000 mL (0 mLs Intravenous Stopped 02/19/21 1328)  ondansetron  (ZOFRAN) injection 4 mg (4 mg Intravenous Given 02/19/21 1223)    ____________________________________________   MDM / ED COURSE   25 year old woman presents with continued abdominal discomfort and urinary symptoms, with evidence of acute cystitis but without evidence of cholecystitis, ultimately amenable to outpatient management with antibiotics and close outpatient follow-up.  She presents tachycardic, but hemodynamically stable.  This resolved with fluid resuscitation.  She has leukocytosis, but no lactic acidosis or signs of renal dysfunction.  RUQ ultrasound confirms cholelithiasis without evidence of cholecystitis.  I suspect her acute symptoms are more related to acute cystitis and a sending urinary tract infection, on the verge of pyelonephritis.  She looks quite well and is otherwise healthy, she is eager to go home, I think it is reasonable to attempt a trial of outpatient management antibiotics.  We discussed symptom control at home, following up with surgery as an outpatient, antibiotic course and return precautions for the ED.  Patient stable for outpatient management.  Clinical Course as of 02/19/21 1445  Sat Feb 19, 2021  1209 Reassessed. [DS]  1401 Reassessed.  Tachycardia is resolved and patient reports feeling better.  We discussed plan of care.  We discussed antibiotics, symptom control at home and following up with surgery as an outpatient.  We discussed return precautions. [DS]    Clinical Course User Index [DS] Delton Prairie, MD    ____________________________________________   FINAL CLINICAL IMPRESSION(S) / ED DIAGNOSES  Final diagnoses:  RUQ abdominal pain  Acute cystitis without hematuria  Calculus of gallbladder without cholecystitis without obstruction     ED Discharge Orders          Ordered    cephALEXin (KEFLEX) 500 MG capsule  4 times daily        02/19/21 1433    ondansetron (ZOFRAN ODT) 4 MG disintegrating tablet  Every 8 hours PRN        02/19/21  1433    oxyCODONE (ROXICODONE) 5 MG immediate release tablet  Every 8 hours PRN        02/19/21 1433             Janeece Blok   Note:  This document was prepared using Dragon voice recognition software and may include unintentional dictation errors.    Delton Prairie, MD 02/19/21 1447

## 2021-02-21 LAB — URINE CULTURE: Culture: 40000 — AB

## 2021-02-24 ENCOUNTER — Emergency Department
Admission: EM | Admit: 2021-02-24 | Discharge: 2021-02-24 | Disposition: A | Discharge status: Home / Self Care | Payer: Self-pay | Attending: Emergency Medicine | Admitting: Emergency Medicine

## 2021-02-24 ENCOUNTER — Encounter: Payer: Self-pay | Admitting: Radiology

## 2021-02-24 ENCOUNTER — Emergency Department: Payer: Self-pay

## 2021-02-24 DIAGNOSIS — R1030 Lower abdominal pain, unspecified: Secondary | ICD-10-CM

## 2021-02-24 DIAGNOSIS — R1031 Right lower quadrant pain: Secondary | ICD-10-CM | POA: Insufficient documentation

## 2021-02-24 DIAGNOSIS — N898 Other specified noninflammatory disorders of vagina: Secondary | ICD-10-CM | POA: Insufficient documentation

## 2021-02-24 DIAGNOSIS — N939 Abnormal uterine and vaginal bleeding, unspecified: Secondary | ICD-10-CM | POA: Insufficient documentation

## 2021-02-24 DIAGNOSIS — R102 Pelvic and perineal pain: Secondary | ICD-10-CM | POA: Insufficient documentation

## 2021-02-24 DIAGNOSIS — R197 Diarrhea, unspecified: Secondary | ICD-10-CM | POA: Insufficient documentation

## 2021-02-24 LAB — CBC WITH DIFFERENTIAL/PLATELET
Abs Immature Granulocytes: 0.06 10*3/uL (ref 0.00–0.07)
Basophils Absolute: 0 10*3/uL (ref 0.0–0.1)
Basophils Relative: 0 %
Eosinophils Absolute: 0.4 10*3/uL (ref 0.0–0.5)
Eosinophils Relative: 4 %
HCT: 39.4 % (ref 36.0–46.0)
Hemoglobin: 12.4 g/dL (ref 12.0–15.0)
Immature Granulocytes: 1 %
Lymphocytes Relative: 45 %
Lymphs Abs: 4.6 10*3/uL — ABNORMAL HIGH (ref 0.7–4.0)
MCH: 23.8 pg — ABNORMAL LOW (ref 26.0–34.0)
MCHC: 31.5 g/dL (ref 30.0–36.0)
MCV: 75.8 fL — ABNORMAL LOW (ref 80.0–100.0)
Monocytes Absolute: 0.4 10*3/uL (ref 0.1–1.0)
Monocytes Relative: 4 %
Neutro Abs: 4.6 10*3/uL (ref 1.7–7.7)
Neutrophils Relative %: 46 %
Platelets: 304 10*3/uL (ref 150–400)
RBC: 5.2 MIL/uL — ABNORMAL HIGH (ref 3.87–5.11)
RDW: 14.2 % (ref 11.5–15.5)
WBC: 10 10*3/uL (ref 4.0–10.5)
nRBC: 0 % (ref 0.0–0.2)

## 2021-02-24 LAB — COMPREHENSIVE METABOLIC PANEL
ALT: 49 U/L — ABNORMAL HIGH (ref 0–44)
AST: 43 U/L — ABNORMAL HIGH (ref 15–41)
Albumin: 4.1 g/dL (ref 3.5–5.0)
Alkaline Phosphatase: 78 U/L (ref 38–126)
Anion gap: 13 (ref 5–15)
BUN: 8 mg/dL (ref 6–20)
CO2: 22 mmol/L (ref 22–32)
Calcium: 9.3 mg/dL (ref 8.9–10.3)
Chloride: 103 mmol/L (ref 98–111)
Creatinine, Ser: 0.57 mg/dL (ref 0.44–1.00)
GFR, Estimated: >60 mL/min (ref >60)
Glucose, Bld: 137 mg/dL — ABNORMAL HIGH (ref 70–99)
Potassium: 3.2 mmol/L — ABNORMAL LOW (ref 3.5–5.1)
Sodium: 138 mmol/L (ref 135–145)
Total Bilirubin: 0.6 mg/dL (ref 0.3–1.2)
Total Protein: 8.1 g/dL (ref 6.5–8.1)

## 2021-02-24 LAB — URINALYSIS, COMPLETE (UACMP) WITH MICROSCOPIC
Bacteria, UA: NONE SEEN
Bilirubin Urine: NEGATIVE
Glucose, UA: NEGATIVE mg/dL
Hgb urine dipstick: NEGATIVE
Ketones, ur: 5 mg/dL — AB
Nitrite: NEGATIVE
Protein, ur: 30 mg/dL — AB
Specific Gravity, Urine: 1.023 (ref 1.005–1.030)
pH: 6 (ref 5.0–8.0)

## 2021-02-24 LAB — WET PREP, GENITAL
Clue Cells Wet Prep HPF POC: NONE SEEN
Sperm: NONE SEEN
Trich, Wet Prep: NONE SEEN
Yeast Wet Prep HPF POC: NONE SEEN

## 2021-02-24 LAB — LIPASE, BLOOD: Lipase: 35 U/L (ref 11–51)

## 2021-02-24 LAB — CHLAMYDIA/NGC RT PCR (ARMC ONLY)
Chlamydia Tr: NOT DETECTED
N gonorrhoeae: NOT DETECTED

## 2021-02-24 LAB — HCG, QUANTITATIVE, PREGNANCY: hCG, Beta Chain, Quant, S: <1 m[IU]/mL (ref <5)

## 2021-02-24 MED ORDER — IOHEXOL 9 MG/ML PO SOLN: 1000.0000 mL | Freq: Once | ORAL | Status: DC | PRN

## 2021-02-24 MED ORDER — ONDANSETRON HCL 4 MG PO TABS: 4.0000 mg | ORAL_TABLET | Freq: Every day | ORAL | 0 refills | Status: AC | PRN

## 2021-02-24 MED ORDER — MORPHINE SULFATE (PF) 4 MG/ML IV SOLN
4.0000 mg | Freq: Once | INTRAVENOUS | Status: AC
Start: 2021-02-24 — End: 2021-02-24
  Administered 2021-02-24: 4 mg via INTRAVENOUS
  Filled 2021-02-24: qty 1

## 2021-02-24 MED ORDER — SODIUM CHLORIDE 0.9 % IV SOLN: INTRAVENOUS | Status: DC

## 2021-02-24 MED ORDER — IOHEXOL 350 MG/ML SOLN: 100.0000 mL | Freq: Once | INTRAVENOUS | Status: DC | PRN

## 2021-02-24 MED ORDER — ONDANSETRON HCL 4 MG/2ML IJ SOLN
4.0000 mg | Freq: Once | INTRAMUSCULAR | Status: AC
  Administered 2021-02-24: 4 mg via INTRAVENOUS
  Filled 2021-02-24: qty 2

## 2021-02-24 MED ORDER — CEFTRIAXONE SODIUM 1 G IJ SOLR
500.0000 mg | Freq: Once | INTRAMUSCULAR | Status: AC
  Administered 2021-02-24: 500 mg via INTRAMUSCULAR
  Filled 2021-02-24: qty 10

## 2021-02-24 MED ORDER — METRONIDAZOLE 500 MG PO TABS: 500.0000 mg | ORAL_TABLET | Freq: Two times a day (BID) | ORAL | 0 refills | Status: AC

## 2021-02-24 MED ORDER — LIDOCAINE HCL (PF) 1 % IJ SOLN
INTRAMUSCULAR | Status: AC
  Administered 2021-02-24: 2.1 mL
  Filled 2021-02-24: qty 5

## 2021-02-24 MED ORDER — DOXYCYCLINE HYCLATE 100 MG PO TABS: 100.0000 mg | ORAL_TABLET | Freq: Two times a day (BID) | ORAL | 0 refills | Status: AC

## 2021-02-24 MED ORDER — IOHEXOL 300 MG/ML  SOLN
100.0000 mL | Freq: Once | INTRAMUSCULAR | Status: AC | PRN
  Administered 2021-02-24: 100 mL via INTRAVENOUS

## 2021-02-24 NOTE — ED Triage Notes (Signed)
Patient returns to ER after being discharged on 7/16, does not know what medicine she was put on for a UTI, states she had a CT scan and gallstones were seen. States she awoke at 2am this morning with abdominal pain, N/V, no diarrhea. Patient arrives via EMS, AxOx4, MD at bedside to evaluate.

## 2021-02-24 NOTE — Discharge Instructions (Addendum)

## 2021-02-24 NOTE — ED Provider Notes (Signed)
CT imaging reassuring.  Patient remains hemodynamically stable.  Per discussion with Dr. Elesa Massed will treat for PID.  Patient agreeable to plan.  Discussed return precautions.   Willy Eddy, MD 02/24/21 [phone]

## 2021-02-24 NOTE — ED Provider Notes (Signed)
[facility] Emergency Department Provider Note  ____________________________________________   Event Date/Time   First MD Initiated Contact with Patient 02/24/21 [phone]     (approximate)  I have reviewed the triage vital signs and the nursing notes.   HISTORY  Chief Complaint Abdominal Pain    HPI Pamela Cole is a 25 y.o. female who presents to the emergency department with EMS for concerns for increasing lower abdominal pain since around 2 AM.  Has had pain intermittently since 02/18/21.  Worse tonight,Describes the pain as sharp, severe, "like I'm having a baby" and diffusely across the lower abdomen.  She denies fever today.  Last fever Saturday the 16th.  Has had nausea without vomiting.  Reports she was constipated but is now having diarrhea.  Denies dysuria, hematuria, vaginal bleeding or discharge.  No previous abdominal surgeries.  Patient is a Therapist, sports.  No h/o STDs.  Sexually active with one female partner.  This is not a new partner.  Patient reports that she was seen in the emergency department at [facility] on 02/18/2021 for right lower quadrant abdominal pain and constipation.  Was found to be febrile, tachycardic.  CT scan concerning for cholelithiasis without cholecystitis.  Discharged with outpatient surgical follow-up.  Patient returned to the emergency department at [facility] on 02/19/2021 for increasing abdominal pain with urinary frequency and sensation of incomplete emptying.  Work-up at that time revealed urinary tract infection and again cholelithiasis without cholecystitis.  Patient given dose of Rocephin in the emergency department and discharged on Keflex.  On review of records, urine culture at [facility] on 02/18/2021 grew urogenital flora.  Urine culture from [place] [facility] on 02/19/2021 grew 40,000 colonies of lactobacillus.  Patient was COVID and flu negative on 02/19/2021. Labs during both visits unremarkable other than  leukocytosis of 13,000 with left shift on 02/18/2021 that appears to have resolved by 02/19/2021.        Past Medical History:  Diagnosis Date   Anemia    Cholelithiasis    UTI (urinary tract infection)    Hx of UTI x 1    Patient Active Problem List   Diagnosis Date Noted   H. pylori infection 06/25/2019   History of abnormal cervical Pap smear 06/25/2019   Iron deficiency anemia 09/24/2016   Anemia affecting pregnancy in third trimester 04/17/2016    Past Surgical History:  Procedure Laterality Date   NO PAST SURGERIES     TOOTH EXTRACTION Left 01/07/2019    Prior to Admission medications   Medication Sig Start Date End Date Taking? Authorizing Provider  acetaminophen (TYLENOL) 325 MG tablet Take 2 tablets (650 mg total) by mouth every 4 (four) hours as needed for mild pain or moderate pain. 07/11/19   Genia Del, CNM  cephALEXin (KEFLEX) 500 MG capsule Take 1 capsule (500 mg total) by mouth 4 (four) times daily for 7 days. 02/19/21 02/26/21  Delton Prairie, MD  ibuprofen (ADVIL) 600 MG tablet Take 1 tablet (600 mg total) by mouth every 6 (six) hours as needed for mild pain, moderate pain or cramping. 07/11/19   Genia Del, CNM  ondansetron (ZOFRAN ODT) 4 MG disintegrating tablet Take 1 tablet (4 mg total) by mouth every 8 (eight) hours as needed for nausea or vomiting. 02/19/21   Delton Prairie, MD  oxyCODONE (ROXICODONE) 5 MG immediate release tablet Take 1 tablet (5 mg total) by mouth every 8 (eight) hours as needed. 02/19/21 02/19/22  Delton Prairie, MD  Prenatal Vit-Fe  Fumarate-FA (PRENATAL MULTIVITAMIN) TABS tablet Take 1 tablet by mouth daily at 12 noon. 07/12/19   Genia Del, CNM    Allergies Patient has no known allergies.  Family History  Problem Relation Age of Onset   Hypertension Mother    Asthma Brother    Breast cancer Maternal Grandmother     Social History Social History   Tobacco Use   Smoking status: Never   Smokeless tobacco: Never   Vaping Use   Vaping Use: Never used  Substance Use Topics   Alcohol use: No   Drug use: No    Review of Systems Constitutional: No fever. Eyes: No visual changes. ENT: No sore throat. Cardiovascular: Denies chest pain. Respiratory: Denies shortness of breath. Gastrointestinal: + Nausea and diarrhea.  No vomiting. Genitourinary: Negative for dysuria. Musculoskeletal: Negative for back pain. Skin: Negative for rash. Neurological: Negative for focal weakness or numbness.  ____________________________________________   PHYSICAL EXAM:  VITAL SIGNS: ED Triage Vitals [02/24/21 0329]  Enc Vitals Group     BP 112/74     Pulse Rate 88     Resp 16     Temp 98.1 F (36.7 C)     Temp Source Oral     SpO2 99 %     Weight      Height      Head Circumference      Peak Flow      Pain Score      Pain Loc      Pain Edu?      Excl. in GC?    CONSTITUTIONAL: Alert and oriented and responds appropriately to questions.  Appears uncomfortable, nontoxic, afebrile HEAD: Normocephalic EYES: Conjunctivae clear, pupils appear equal, EOM appear intact ENT: normal nose; moist mucous membranes NECK: Supple, normal ROM CARD: RRR; S1 and S2 appreciated; no murmurs, no clicks, no rubs, no gallops RESP: Normal chest excursion without splinting or tachypnea; breath sounds clear and equal bilaterally; no wheezes, no rhonchi, no rales, no hypoxia or respiratory distress, speaking full sentences ABD/GI: Normal bowel sounds; non-distended; soft, diffusely tender throughout the lower abdomen without guarding or rebound GU:  Normal external genitalia. No lesions, rashes noted. Small amount of mucus appearing yellow vaginal discharge coming from the cervical os.  Patient appears to have bilateral adnexal tenderness without mass or fullness.  She does have some mild cervical motion tenderness and her cervix is slightly friable and appears consistent with a multi parous female.  Small amount of vaginal  bleeding seen as well.  Chaperone present for exam. BACK: The back appears normal EXT: Normal ROM in all joints; no deformity noted, no edema; no cyanosis SKIN: Normal color for age and race; warm; no rash on exposed skin NEURO: Moves all extremities equally PSYCH: The patient's mood and manner are appropriate.  ____________________________________________   LABS (all labs ordered are listed, but only abnormal results are displayed)  Labs Reviewed  WET PREP, GENITAL - Abnormal; Notable for the following components:      Result Value   WBC, Wet Prep HPF POC MODERATE (*)    All other components within normal limits  CBC WITH DIFFERENTIAL/PLATELET - Abnormal; Notable for the following components:   RBC 5.20 (*)    MCV 75.8 (*)    MCH 23.8 (*)    Lymphs Abs 4.6 (*)    All other components within normal limits  COMPREHENSIVE METABOLIC PANEL - Abnormal; Notable for the following components:   Potassium 3.2 (*)    Glucose, Bld  137 (*)    AST 43 (*)    ALT 49 (*)    All other components within normal limits  URINALYSIS, COMPLETE (UACMP) WITH MICROSCOPIC - Abnormal; Notable for the following components:   Color, Urine AMBER (*)    APPearance HAZY (*)    Ketones, ur 5 (*)    Protein, ur 30 (*)    Leukocytes,Ua LARGE (*)    All other components within normal limits  CHLAMYDIA/NGC RT PCR ([facility] ONLY)            URINE CULTURE  LIPASE, BLOOD  HCG, QUANTITATIVE, PREGNANCY   ____________________________________________  EKG   ____________________________________________  RADIOLOGY I, Jerita Wimbush, personally viewed and evaluated these images (plain radiographs) as part of my medical decision making, as well as reviewing the written report by the radiologist.  ED MD interpretation: Pelvic ultrasound unremarkable.  CT abdomen pelvis pending.  Official radiology report(s): US PELVIC COMPLETE W TRANSVAGINAL AND TORSION R/O  Result Date: 02/24/2021 CLINICAL DATA:  Pelvic pain EXAM:  TRANSABDOMINAL AND TRANSVAGINAL ULTRASOUND OF PELVIS DOPPLER ULTRASOUND OF OVARIES TECHNIQUE: Both transabdominal and transvaginal ultrasound examinations of the pelvis were performed. Transabdominal technique was performed for global imaging of the pelvis including uterus, ovaries, adnexal regions, and pelvic cul-de-sac. It was necessary to proceed with endovaginal exam following the transabdominal exam to visualize the adnexa. Color and duplex Doppler ultrasound was utilized to evaluate blood flow to the ovaries. COMPARISON:  03/31/2017 FINDINGS: Uterus Measurements: 6 x 3 cm no fibroids or other mass visualized. Endometrium Thickness: 15 mm.  No focal abnormality visualized. Right ovary Measurements: 32 x 19 x 16 mm = volume: 5 mL. Normal appearance/no adnexal mass. Left ovary Measurements: 37 x 16 x 20 mm = volume: 6 mL. Normal appearance/no adnexal mass. Pulsed Doppler evaluation of both ovaries demonstrates normal low-resistance arterial and venous waveforms. Other findings No abnormal free fluid. IMPRESSION: No acute finding.  Normal ovarian blood flow bilaterally. Electronically Signed   By: Marnee SpringJonathon  Watts M.D.   On: 02/24/2021 05:31    ____________________________________________   PROCEDURES  Procedure(s) performed (including Critical Care):  Procedures   ____________________________________________   INITIAL IMPRESSION / ASSESSMENT AND PLAN / ED COURSE  As part of my medical decision making, I reviewed the following data within the electronic MEDICAL RECORD NUMBER Nursing notes reviewed and incorporated, Labs reviewed , Old chart reviewed, Patient signed out to Dr. Roxan Hockeyobinson, Radiograph reviewed , and Notes from prior ED visits         Patient here with increasing lower abdominal pain.  This is her third visit to the emergency department for what appears to be similar symptoms in the past week.  She has been diagnosed with cholelithiasis without cholecystitis.  She does not complain of  upper abdominal pain today and has a negative Murphy sign.  I do not think this is what is causing her lower abdominal pain today.  She [facility] July 15 in which the appendix was not visualized.  On the 16th she was seen [facility] and did have what appeared to be an infected urine however urine culture only grew lactobacillus.  She has been on Keflex and feels symptoms worsen tonight.  Differential includes UTI, pyelonephritis, kidney stone, appendicitis, TOA, torsion, ectopic, PID.  Will obtain labs, urine, urine culture.  Will perform pelvic exam with cultures and obtain transvaginal ultrasound with Doppler.  Will give IV fluids, pain and nausea medicine.  If  pelvic work-up is unremarkable, plan to likely repeat CT imaging today.  ED PROGRESS  Labs today show no leukocytosis.  Normal renal function, LFTs and lipase.  Urine pregnancy test is negative.  Urinalysis looks more like a contaminated sample rather than a UTI.  Patient's transvaginal ultrasound is unremarkable.  Normal blood flow to both ovaries.  We will repeat CT of the abdomen pelvis.  She reports pain is now a 4/10 down from a 10/10.  We will continue to closely monitor.  7:00 AM  Pt still awaiting CT scan.  Signed out to Dr. Roxan Hockey.  If CT scan is unremarkable and no signs of appendicitis, plan will be to discharge home with treatment for PID given she did have cervical motion tenderness.  Wet prep unremarkable.  I reviewed all nursing notes and pertinent previous records as available.  I have reviewed and interpreted any EKGs, lab and urine results, imaging (as available).  ____________________________________________   FINAL CLINICAL IMPRESSION(S) / ED DIAGNOSES  Final diagnoses:  Pelvic pain     ED Discharge Orders     None       *Please note:  Lyn Jhoana Acuna Villamar was evaluated in Emergency Department on 02/24/2021 for the symptoms described in the history of present  illness. She was evaluated in the context of the global COVID-19 pandemic, which necessitated consideration that the patient might be at risk for infection with the SARS-CoV-2 virus that causes COVID-19. Institutional protocols and algorithms that pertain to the evaluation of patients at risk for COVID-19 are in a state of rapid change based on information released by regulatory bodies including the CDC and federal and state organizations. These policies and algorithms were followed during the patient's care in the ED.  Some ED evaluations and interventions may be delayed as a result of limited staffing during and the pandemic.*   Note:  This document was prepared using Dragon voice recognition software and may include unintentional dictation errors.    Charlie Seda, Layla Maw, DO 02/25/21 [phone]

## 2021-02-25 LAB — URINE CULTURE: Culture: <10000 — AB

## 2021-03-10 ENCOUNTER — Encounter: Payer: Self-pay | Admitting: Nurse Practitioner

## 2021-03-10 NOTE — Progress Notes (Signed)
Telephone call to patient regarding PAP follow-up.  History of abnormal PAP.  Appointment scheduled for 03/14/2021 at 310 PM. Glenna Fellows, RN

## 2021-03-14 ENCOUNTER — Other Ambulatory Visit: Payer: Self-pay

## 2021-03-14 ENCOUNTER — Ambulatory Visit (LOCAL_COMMUNITY_HEALTH_CENTER): Payer: Self-pay | Admitting: Advanced Practice Midwife

## 2021-03-14 ENCOUNTER — Encounter: Payer: Self-pay | Admitting: Advanced Practice Midwife

## 2021-03-14 VITALS — BP 105/73 | Ht 59.5 in | Wt 157.0 lb

## 2021-03-14 DIAGNOSIS — N730 Acute parametritis and pelvic cellulitis: Secondary | ICD-10-CM

## 2021-03-14 DIAGNOSIS — E669 Obesity, unspecified: Secondary | ICD-10-CM

## 2021-03-14 DIAGNOSIS — Z8742 Personal history of other diseases of the female genital tract: Secondary | ICD-10-CM

## 2021-03-14 DIAGNOSIS — Z3009 Encounter for other general counseling and advice on contraception: Secondary | ICD-10-CM

## 2021-03-14 DIAGNOSIS — Z3046 Encounter for surveillance of implantable subdermal contraceptive: Secondary | ICD-10-CM

## 2021-03-14 LAB — WET PREP FOR TRICH, YEAST, CLUE
Trichomonas Exam: NEGATIVE
Yeast Exam: NEGATIVE

## 2021-03-14 LAB — HEMOGLOBIN, FINGERSTICK: Hemoglobin: 13.8 g/dL (ref 11.1–15.9)

## 2021-03-14 MED ORDER — CLOTRIMAZOLE 1 % VA CREA: 1.0000 | TOPICAL_CREAM | Freq: Every day | VAGINAL | 0 refills | Status: AC

## 2021-03-14 NOTE — Progress Notes (Signed)
[facility] [address] Main Number: [phone]    Family Planning Visit- Initial Visit  Subjective:  Pamela Cole is a 25 y.o. SHF nonsmoker [id number] (7,4,1 1/2) not breastfeeding  being seen today for an initial annual visit and to discuss contraceptive options.  The patient is currently using Nexplanon for pregnancy prevention. Patient reports she does not want a pregnancy in the next year.  Patient has the following medical conditions has Anemia affecting pregnancy in third trimester; Iron deficiency anemia; H. pylori infection; History of abnormal cervical Pap smear; and PID (acute pelvic inflammatory disease) 02/24/21 on their problem list.  Chief Complaint  Patient presents with   Annual Exam   Gynecologic Exam    Patient reports here for physical, pap. LMP 07/2019. Nexplanon inserted 08/21/19. Diagnosed with PID 02/24/21 in ER and given Rocephin, 7 days of Metronidazole and Doxy. Scheduled for gallbladder surgery for gallstones 05/03/21. Last sex 03/12/21 without condom; with current partner x 6 years; 1 sex partner in last 3 mo. 07/25/18 LSIL, 01/09/19 LSIL, 02/13/20 ASCUS. Last ETOH 02/01/21 (2 Smirnoffs) 3-4x/year. Not working. Not in school. Living with her partner and her 3 kids.  Patient denies cigs, vaping, cigars, MJ  Body mass index is 31.18 kg/m. - Patient is eligible for diabetes screening based on BMI and age >52?  not applicable HA1C ordered? not applicable  Patient reports 1  partner/s in last year. Desires STI screening?  Yes  Has patient been screened once for HCV in the past?  No  No results found for: HCVAB  Does the patient have current drug use (including MJ), have a partner with drug use, and/or has been incarcerated since last result? No  If yes-- Screen for HCV through [facility]   Does the patient meet criteria for HBV testing? No  Criteria:  -Household, sexual or needle sharing  contact with HBV -History of drug use -HIV positive -Those with known Hep C   Health Maintenance Due  Topic Date Due   COVID-19 Vaccine (1) Never done   HPV VACCINES (1 - 2-dose series) Never done   Hepatitis C Screening  Never done   PAP SMEAR-Modifier  02/12/2021   INFLUENZA VACCINE  03/07/2021    Review of Systems  Gastrointestinal:  Positive for nausea (without vomiting due to gallstones >1 mo).  All other systems reviewed and are negative.  The following portions of the patient's history were reviewed and updated as appropriate: allergies, current medications, past family history, past medical history, past social history, past surgical history and problem list. Problem list updated.   See flowsheet for other program required questions.  Objective:   Vitals:   03/14/21 1521  BP: 105/73  Weight: 157 lb (71.2 kg)  Height: 4' 11.5" (1.511 m)    Physical Exam Constitutional:      Appearance: Normal appearance. She is obese.  HENT:     Head: Normocephalic and atraumatic.     Mouth/Throat:     Mouth: Mucous membranes are moist.     Comments: Missing teeth and caries; states last dental exam 12/2020 and sees q 6 mo Eyes:     Conjunctiva/sclera: Conjunctivae normal.  Neck:     Thyroid: No thyroid mass, thyromegaly or thyroid tenderness.  Cardiovascular:     Rate and Rhythm: Normal rate and regular rhythm.  Pulmonary:     Effort: Pulmonary effort is normal.     Breath sounds: Normal breath sounds.  Abdominal:     Palpations: Abdomen is soft.     Comments: Soft without masses or tenderness, poor tone  Genitourinary:    General: Normal vulva.     Exam position: Lithotomy position.     Vagina: Vaginal discharge (white creamy leukorrhea, pph<4.5) present.     Cervix: Friability (friable to pap, moderate ectropian; c/o postcoital spotting) present. No cervical motion tenderness.     Uterus: Normal.      Adnexa: Right adnexa normal and left adnexa normal.     Rectum:  Normal.  Musculoskeletal:        General: Normal range of motion.     Cervical back: Normal range of motion and neck supple.  Skin:    General: Skin is warm and dry.  Neurological:     Mental Status: She is alert.  Psychiatric:        Mood and Affect: Mood normal.      Assessment and Plan:  Pamela Cole is a 25 y.o. female presenting to the [facility] Department for an initial annual wellness/contraceptive visit  Contraception counseling: Reviewed all forms of birth control options in the tiered based approach. available including abstinence; over the counter/barrier methods; hormonal contraceptive medication including pill, patch, ring, injection,contraceptive implant, ECP; hormonal and nonhormonal IUDs; permanent sterilization options including vasectomy and the various tubal sterilization modalities. Risks, benefits, and typical effectiveness rates were reviewed.  Questions were answered.  Written information was also given to the patient to review.  Patient desires continuation of Nexplanon, this was prescribed for patient. She will follow up in  1 year for surveillance.  She was told to call with any further questions, or with any concerns about this method of contraception.  Emphasized use of condoms 100% of the time for STI prevention.  Patient was offered ECP. ECP was not accepted by the patient. ECP counseling was not given - see RN documentation  1. Family planning Treat wet mount per standing orders Immunization nurse consult - WET PREP FOR TRICH, YEAST, CLUE - Hemoglobin, venipuncture - Chlamydia/Gonorrhea [place] [facility] - HIV [place] [facility] - Syphilis Serology,  Lab - Pap IG (Image Guided)  2. Encounter for surveillance of implantable subdermal contraceptive Happy with Nexplanon inserted 08/21/19  3. PID (acute pelvic inflammatory disease) 02/24/21 Still on antibiotics but states feels better  4. History of abnormal cervical Pap  smear 07/25/18 LSIL, 01/09/19 LSIL, 02/13/20 ASCUS     No follow-ups on file.  No future appointments.  Alberteen Spindle, CNM

## 2021-03-14 NOTE — Progress Notes (Signed)
Patient here for PE and repeat Pap. States she is taking antibiotics prescribed after ED for PID. Last Pap 02/13/2020, ASCUS. Pap 01/09/2019 was LSIL. Patient had PP exam 08/21/2019. States she has an appointment for gallbladder surgery 05/03/2021. States she is having vaginal discomfort.Burt Knack, RN

## 2021-03-14 NOTE — Progress Notes (Signed)
In house labs reviewed with provider, and patient treated with clotrimazole per provider VO.Marland KitchenMarland KitchenBurt Knack, RN

## 2021-03-16 LAB — PAP IG (IMAGE GUIDED): PAP Smear Comment: 0

## 2021-03-17 LAB — HM HIV SCREENING LAB: HM HIV Screening: NEGATIVE

## 2021-08-06 ENCOUNTER — Emergency Department
Admission: EM | Admit: 2021-08-06 | Discharge: 2021-08-06 | Disposition: A | Discharge status: Home / Self Care | Payer: Self-pay | Attending: Emergency Medicine | Admitting: Emergency Medicine

## 2021-08-06 ENCOUNTER — Other Ambulatory Visit: Payer: Self-pay

## 2021-08-06 ENCOUNTER — Emergency Department: Payer: Self-pay

## 2021-08-06 DIAGNOSIS — N390 Urinary tract infection, site not specified: Secondary | ICD-10-CM

## 2021-08-06 DIAGNOSIS — R1030 Lower abdominal pain, unspecified: Secondary | ICD-10-CM

## 2021-08-06 DIAGNOSIS — R1031 Right lower quadrant pain: Secondary | ICD-10-CM | POA: Insufficient documentation

## 2021-08-06 LAB — CBC
HCT: 40.5 % (ref 36.0–46.0)
Hemoglobin: 12.4 g/dL (ref 12.0–15.0)
MCH: 23.6 pg — ABNORMAL LOW (ref 26.0–34.0)
MCHC: 30.6 g/dL (ref 30.0–36.0)
MCV: 77 fL — ABNORMAL LOW (ref 80.0–100.0)
Platelets: 238 10*3/uL (ref 150–400)
RBC: 5.26 MIL/uL — ABNORMAL HIGH (ref 3.87–5.11)
RDW: 14.7 % (ref 11.5–15.5)
WBC: 12.6 10*3/uL — ABNORMAL HIGH (ref 4.0–10.5)
nRBC: 0 % (ref 0.0–0.2)

## 2021-08-06 LAB — POC URINE PREG, ED: Preg Test, Ur: NEGATIVE

## 2021-08-06 LAB — URINALYSIS, ROUTINE W REFLEX MICROSCOPIC
Bilirubin Urine: NEGATIVE
Glucose, UA: NEGATIVE mg/dL
Ketones, ur: NEGATIVE mg/dL
Nitrite: NEGATIVE
Protein, ur: 30 mg/dL — AB
Specific Gravity, Urine: 1.023 (ref 1.005–1.030)
WBC, UA: >50 WBC/hpf — ABNORMAL HIGH (ref 0–5)
pH: 5 (ref 5.0–8.0)

## 2021-08-06 LAB — COMPREHENSIVE METABOLIC PANEL
ALT: 39 U/L (ref 0–44)
AST: 26 U/L (ref 15–41)
Albumin: 4.5 g/dL (ref 3.5–5.0)
Alkaline Phosphatase: 80 U/L (ref 38–126)
Anion gap: 7 (ref 5–15)
BUN: 6 mg/dL (ref 6–20)
CO2: 24 mmol/L (ref 22–32)
Calcium: 8.8 mg/dL — ABNORMAL LOW (ref 8.9–10.3)
Chloride: 104 mmol/L (ref 98–111)
Creatinine, Ser: 0.45 mg/dL (ref 0.44–1.00)
GFR, Estimated: >60 mL/min (ref >60)
Glucose, Bld: 106 mg/dL — ABNORMAL HIGH (ref 70–99)
Potassium: 3.3 mmol/L — ABNORMAL LOW (ref 3.5–5.1)
Sodium: 135 mmol/L (ref 135–145)
Total Bilirubin: 0.3 mg/dL (ref 0.3–1.2)
Total Protein: 7.8 g/dL (ref 6.5–8.1)

## 2021-08-06 LAB — LIPASE, BLOOD: Lipase: 35 U/L (ref 11–51)

## 2021-08-06 MED ORDER — MORPHINE SULFATE (PF) 2 MG/ML IV SOLN
2.0000 mg | Freq: Once | INTRAVENOUS | Status: AC
Start: 2021-08-06 — End: 2021-08-06
  Administered 2021-08-06: 2 mg via INTRAVENOUS
  Filled 2021-08-06: qty 1

## 2021-08-06 MED ORDER — IOHEXOL 300 MG/ML  SOLN
100.0000 mL | Freq: Once | INTRAMUSCULAR | Status: AC | PRN
  Administered 2021-08-06: 100 mL via INTRAVENOUS
  Filled 2021-08-06: qty 100

## 2021-08-06 MED ORDER — ONDANSETRON HCL 4 MG/2ML IJ SOLN
4.0000 mg | Freq: Once | INTRAMUSCULAR | Status: AC
  Administered 2021-08-06: 4 mg via INTRAVENOUS
  Filled 2021-08-06: qty 2

## 2021-08-06 MED ORDER — SODIUM CHLORIDE 0.9 % IV SOLN
1000.0000 mL | Freq: Once | INTRAVENOUS | Status: AC
  Administered 2021-08-06: 1000 mL via INTRAVENOUS

## 2021-08-06 MED ORDER — CEPHALEXIN 500 MG PO CAPS: 500.0000 mg | ORAL_CAPSULE | Freq: Two times a day (BID) | ORAL | 0 refills | Status: DC

## 2021-08-06 MED ORDER — FLUCONAZOLE 150 MG PO TABS: 150.0000 mg | ORAL_TABLET | Freq: Every day | ORAL | 0 refills | Status: DC

## 2021-08-06 NOTE — ED Triage Notes (Signed)
Pt having lower abdominal cramping- pt denies urinary symptoms and vaginal symptoms- pt has been nauseous, but denies vomiting and diarrhea- pt states when she wipes there is blood from her rectum

## 2021-08-06 NOTE — ED Notes (Signed)
Patient called - in bathroom

## 2021-08-06 NOTE — ED Provider Notes (Signed)
[facility] Emergency Department Provider Note   ____________________________________________    I have reviewed the triage vital signs and the nursing notes.   HISTORY  Chief Complaint Abdominal Pain     HPI Pamela Cole is a 25 y.o. female who presents with complaints of abdominal pain.  Patient describes lower abdominal pain, right greater than left which developed yesterday and has been consistent since then.  She describes occasional radiation to her umbilicus.  She is having some loose stools and nausea but no vomiting.  No fevers reported.  History of a cholecystectomy.  Has not take anything for this.  No vaginal discharge.  She has an Implanon, has irregular breakthrough bleeding, none currently  Past Medical History:  Diagnosis Date   Anemia    Cholelithiasis    UTI (urinary tract infection)    Hx of UTI x 1   Vaginal Pap smear, abnormal     Patient Active Problem List   Diagnosis Date Noted   PID (acute pelvic inflammatory disease) 02/24/21 03/14/2021   Obesity BMI=31.1 03/14/2021   H. pylori infection 06/25/2019   History of abnormal cervical Pap smear 06/25/2019   Iron deficiency anemia 09/24/2016   Anemia affecting pregnancy in third trimester 04/17/2016    Past Surgical History:  Procedure Laterality Date   CHOLECYSTECTOMY     NO PAST SURGERIES     TOOTH EXTRACTION Left 01/07/2019    Prior to Admission medications   Medication Sig Start Date End Date Taking? Authorizing Provider  acetaminophen (TYLENOL) 325 MG tablet Take 2 tablets (650 mg total) by mouth every 4 (four) hours as needed for mild pain or moderate pain. 07/11/19   Genia Del, CNM  ibuprofen (ADVIL) 600 MG tablet Take 1 tablet (600 mg total) by mouth every 6 (six) hours as needed for mild pain, moderate pain or cramping. 07/11/19   Genia Del, CNM  ondansetron (ZOFRAN ODT) 4 MG disintegrating tablet Take 1 tablet (4 mg total) by mouth  every 8 (eight) hours as needed for nausea or vomiting. Patient not taking: Reported on 03/14/2021 02/19/21   Delton Prairie, MD  ondansetron [facility]) 4 MG tablet Take 1 tablet (4 mg total) by mouth daily as needed for nausea or vomiting. 02/24/21 02/24/22  Willy Eddy, MD  oxyCODONE (ROXICODONE) 5 MG immediate release tablet Take 1 tablet (5 mg total) by mouth every 8 (eight) hours as needed. Patient not taking: Reported on 03/14/2021 02/19/21 02/19/22  Delton Prairie, MD  Prenatal Vit-Fe Fumarate-FA (PRENATAL MULTIVITAMIN) TABS tablet Take 1 tablet by mouth daily at 12 noon. Patient not taking: Reported on 03/14/2021 07/12/19   Genia Del, CNM     Allergies Patient has no known allergies.  Family History  Problem Relation Age of Onset   Breast cancer Maternal Grandmother    Diabetes Mother    Hypertension Mother    Asthma Brother     Social History Social History   Tobacco Use   Smoking status: Never    Passive exposure: Never   Smokeless tobacco: Never   Tobacco comments:    Partner smokes outside.  Vaping Use   Vaping Use: Never used  Substance Use Topics   Alcohol use: Yes    Alcohol/week: 2.0 standard drinks    Types: 2 Standard drinks or equivalent per week    Comment: last use 02/01/21   Drug use: No    Review of Systems  Constitutional: No fever/chills Eyes: No visual changes.  ENT:  No sore throat. Cardiovascular: Denies chest pain. Respiratory: Denies shortness of breath. Gastrointestinal: As above Genitourinary: No dysuria, no vaginal discharge, no vaginal bleeding Musculoskeletal: Negative for back pain. Skin: Negative for rash. Neurological: Negative for headaches or weakness   ____________________________________________   PHYSICAL EXAM:  VITAL SIGNS: ED Triage Vitals  Enc Vitals Group     BP 08/06/21 0923 122/78     Pulse Rate 08/06/21 0923 97     Resp 08/06/21 0923 20     Temp 08/06/21 0923 98.1 F (36.7 C)     Temp Source 08/06/21 0923  Oral     SpO2 08/06/21 0923 98 %     Weight 08/06/21 0925 71.2 kg (157 lb)     Height 08/06/21 0925 1.499 m (4\' 11" )     Head Circumference --      Peak Flow --      Pain Score 08/06/21 0924 5     Pain Loc --      Pain Edu? --      Excl. in [place]? --     Constitutional: Alert and oriented. No acute distress. Pleasant and interactive Eyes: Conjunctivae are normal.  Head: Atraumatic.  Cardiovascular: Normal rate, regular rhythm. Grossly normal heart sounds.  Good peripheral circulation. Respiratory: Normal respiratory effort.  No retractions.  Gastrointestinal: Soft, mild tenderness right lower quadrant and left lower quadrant. No distention.  Genitourinary: deferred Musculoskeletal:  Warm and well perfused Neurologic:  Normal speech and language. No gross focal neurologic deficits are appreciated.  Skin:  Skin is warm, dry and intact. No rash noted. Psychiatric: Mood and affect are normal. Speech and behavior are normal.  ____________________________________________   LABS (all labs ordered are listed, but only abnormal results are displayed)  Labs Reviewed  COMPREHENSIVE METABOLIC PANEL - Abnormal; Notable for the following components:      Result Value   Potassium 3.3 (*)    Glucose, Bld 106 (*)    Calcium 8.8 (*)    All other components within normal limits  CBC - Abnormal; Notable for the following components:   WBC 12.6 (*)    RBC 5.26 (*)    MCV 77.0 (*)    MCH 23.6 (*)    All other components within normal limits  LIPASE, BLOOD  URINALYSIS, ROUTINE W REFLEX MICROSCOPIC  POC URINE PREG, ED   ____________________________________________  EKG   ____________________________________________  RADIOLOGY  CT abdomen pelvis ____________________________________________   PROCEDURES  Procedure(s) performed: No  Procedures   Critical Care performed: No ____________________________________________   INITIAL IMPRESSION / ASSESSMENT AND PLAN / ED  COURSE  Pertinent labs & imaging results that were available during my care of the patient were reviewed by me and considered in my medical decision making (see chart for details).   Patient presents with lower abdominal pain as detailed above, right sided tenderness, differential includes appendicitis, enteritis, colitis.  No dysuria no vaginal discharge no CVA tenderness.  Will treat with IV morphine, IV Zofran, obtain CT abdomen pelvis  Pregnancy is negative    ____________________________________________   FINAL CLINICAL IMPRESSION(S) / ED DIAGNOSES  Final diagnoses:  None        Note:  This document was prepared using Dragon voice recognition software and may include unintentional dictation errors.    Lavonia Drafts, MD 08/06/21 1309

## 2021-08-06 NOTE — ED Notes (Signed)
Pt in CT.

## 2022-08-24 ENCOUNTER — Encounter: Payer: Self-pay | Admitting: Physician Assistant

## 2022-08-24 ENCOUNTER — Ambulatory Visit (LOCAL_COMMUNITY_HEALTH_CENTER): Payer: Self-pay | Admitting: Physician Assistant

## 2022-08-24 VITALS — BP 116/70 | HR 92 | Temp 97.0°F | Ht 59.0 in | Wt 149.8 lb

## 2022-08-24 DIAGNOSIS — Z01419 Encounter for gynecological examination (general) (routine) without abnormal findings: Secondary | ICD-10-CM

## 2022-08-24 DIAGNOSIS — Z3009 Encounter for other general counseling and advice on contraception: Secondary | ICD-10-CM

## 2022-08-24 DIAGNOSIS — Z8742 Personal history of other diseases of the female genital tract: Secondary | ICD-10-CM

## 2022-08-24 LAB — HM HIV SCREENING LAB: HM HIV Screening: NEGATIVE

## 2022-08-24 NOTE — Progress Notes (Signed)
Erroneous encounter, disregard.

## 2022-08-24 NOTE — Progress Notes (Signed)
[facility] [place] Number: [phone]  Family Planning Visit- Repeat Yearly Visit  Subjective:  Pamela Cole is a 27 y.o. [id number]  being seen today for an annual wellness visit and to discuss contraception options.   The patient is currently using Hormonal Implant for pregnancy prevention. Patient does not want a pregnancy in the next year.    report they are looking for a method that provides High efficacy at preventing pregnancy   Patient has the following medical problems: has Anemia affecting pregnancy in third trimester; Iron deficiency anemia; H. pylori infection; History of abnormal cervical Pap smear; PID (acute pelvic inflammatory disease) 02/24/21; and Obesity BMI=31.1 on their problem list.  Chief Complaint  Patient presents with   Annual Exam    Patient reports she does not have a PCP.  See flowsheet for other program required questions.   Body mass index is 30.26 kg/m. - Patient is eligible for diabetes screening based on BMI> 25 and age >29?  not applicable BM8U ordered? no  Patient reports 1 of partners in last year. Desires STI screening?  Yes   Has patient been screened once for HCV in the past?  Yes  No results found for: "HCVAB"HCV neg 05/17/21  Does the patient have current of drug use, have a partner with drug use, and/or has been incarcerated since last result? No  If yes-- Screen for HCV through [facility]   Does the patient meet criteria for HBV testing? No  Criteria:  -Household, sexual or needle sharing contact with HBV -History of drug use -HIV positive -Those with known Hep C   Health Maintenance Due  Topic Date Due   COVID-19 Vaccine (1) Never done   Hepatitis C Screening  Never done   INFLUENZA VACCINE  03/07/2022   PAP SMEAR-Modifier  03/14/2022    Review of Systems  HENT: Negative.    Eyes:        Occ sees fleeting flashing lights.   Respiratory: Negative.    Cardiovascular:        Occ feels chest pain (including today) with deep breathing  Gastrointestinal:        Frequent nausea, unassociated with meals, some left low abd pain; bowel movements 3x week (unchanged)  Genitourinary: Negative.   Musculoskeletal: Negative.   Skin:        Feels like tops of ears are yellow-tinged.  Neurological:        Occ Headache, esp at work (machine assembly)  Endo/Heme/Allergies: Negative.   Psychiatric/Behavioral: Negative.      The following portions of the patient's history were reviewed and updated as appropriate: allergies, current medications, past family history, past medical history, past social history, past surgical history and problem list. Problem list updated.  Objective:   Vitals:   08/24/22 1555  BP: 116/70  Pulse: 92  Temp: (!) 97 F (36.1 C)  Weight: 149 lb 12.8 oz (67.9 kg)  Height: 4\' 11"  (1.499 m)    Physical Exam Vitals and nursing note reviewed.  Constitutional:      Appearance: Normal appearance.  HENT:     Head: Normocephalic and atraumatic.     Mouth/Throat:     Mouth: Mucous membranes are moist.     Pharynx: Oropharynx is clear. No oropharyngeal exudate or posterior oropharyngeal erythema.  Cardiovascular:     Rate and Rhythm: Normal rate and regular rhythm.     Pulses: Normal pulses.  Heart sounds: Normal heart sounds.  Pulmonary:     Effort: Pulmonary effort is normal.  Chest:  Breasts:    Tanner Score is 5.     Right: Normal. No mass or tenderness.     Left: Normal. No mass or tenderness.  Abdominal:     General: Bowel sounds are normal.     Palpations: Abdomen is soft. There is no mass.     Tenderness: There is no abdominal tenderness. There is no guarding or rebound.  Genitourinary:    General: Normal vulva.     Exam position: Lithotomy position.     Pubic Area: No rash or pubic lice.      Labia:        Right: No rash or lesion.        Left: No rash or lesion.       Vagina: Normal. No vaginal discharge, erythema, bleeding or lesions.     Cervix: No cervical motion tenderness, discharge, friability, lesion or erythema.     Uterus: Normal.      Adnexa: Right adnexa normal and left adnexa normal.  Musculoskeletal:        General: Normal range of motion.  Lymphadenopathy:     Head:     Right side of head: No preauricular or posterior auricular adenopathy.     Left side of head: No preauricular or posterior auricular adenopathy.     Cervical: No cervical adenopathy.     Upper Body:     Right upper body: No supraclavicular, axillary or epitrochlear adenopathy.     Left upper body: No supraclavicular, axillary or epitrochlear adenopathy.     Lower Body: No right inguinal adenopathy. No left inguinal adenopathy.  Skin:    General: Skin is warm and dry.     Coloration: Skin is not jaundiced.     Findings: No rash.  Neurological:     General: No focal deficit present.     Mental Status: She is alert and oriented to person, place, and time.  Psychiatric:        Mood and Affect: Mood normal.        Behavior: Behavior normal.     Assessment and Plan:  Pamela Cole is a 27 y.o. female [phone] presenting to the [facility] Department for an yearly wellness and contraception visit   Contraception counseling: Reviewed options based on patient desire and reproductive life plan. Patient is interested in Hormonal Implant. This was not provided to the patient today.  if not why not clearly documented (already has, will keep)  Risks, benefits, and typical effectiveness rates were reviewed.  Questions were answered.  Written information was also given to the patient to review.    The patient will follow up in  12 months for surveillance.  The patient was told to call with any further questions, or with any concerns about this method of contraception.  Emphasized use of condoms 100% of the time for STI prevention.  Patient was assessed for  need for ECP. Patient not was offered ECP based on > 120 hours .     1. Family planning services Discussion option for continuation of Nexplanon for 1 more year due to current evidence of contraceptive efficacy thru year 4 vs replacing today. Pt elects to continue current implant for 1 more year. Considering BTL [employer] given.  2. Well woman exam with routine gynecological exam Recommend PCP eval due to multiple somatic complaints, also recommend optometric  eval (had not had in years). Please give list of local providers.  - Syphilis Serology, [place] [facility] - IGP, rfx Aptima HPV ASCU - HIV [place] [facility] - Chlamydia/Gonorrhea [place] [facility]  3. History of abnormal cervical Pap smear H/o LSIL 07/2018, LSIL 01/2019, ASCUS 02/2020, NILM 03/2021. Repeat Pap today with reflex HPV testing if ASCUS due to age. - IGP, rfx Aptima HPV ASCU   Return in about 1 year (around 08/25/2023) for Annual well-woman exam.   Lora Havens, PA-C

## 2022-08-28 LAB — IGP, RFX APTIMA HPV ASCU: PAP Smear Comment: 0

## 2022-08-31 NOTE — Progress Notes (Signed)
Pap reviewed, negative result. Recommend repeat in 3 years (08/2025). Please send letter to patient.

## 2022-09-20 ENCOUNTER — Telehealth: Payer: Self-pay | Admitting: Family Medicine

## 2022-09-20 NOTE — Telephone Encounter (Signed)
Patient would like someone to call her about her results from last visit.

## 2023-05-29 ENCOUNTER — Other Ambulatory Visit: Payer: Self-pay

## 2023-05-29 ENCOUNTER — Encounter: Payer: Self-pay | Admitting: *Deleted

## 2023-05-29 ENCOUNTER — Emergency Department: Payer: Self-pay

## 2023-05-29 ENCOUNTER — Emergency Department
Admission: EM | Admit: 2023-05-29 | Discharge: 2023-05-29 | Discharge status: Against Medical Advice | Payer: Self-pay | Attending: Emergency Medicine | Admitting: Emergency Medicine

## 2023-05-29 DIAGNOSIS — S0990XA Unspecified injury of head, initial encounter: Secondary | ICD-10-CM | POA: Insufficient documentation

## 2023-05-29 DIAGNOSIS — Z5321 Procedure and treatment not carried out due to patient leaving prior to being seen by health care provider: Secondary | ICD-10-CM | POA: Insufficient documentation

## 2023-05-29 NOTE — ED Notes (Signed)
No answer when called several times from lobby 
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
## 2024-05-05 ENCOUNTER — Other Ambulatory Visit: Payer: Self-pay

## 2024-05-05 ENCOUNTER — Emergency Department: Payer: Self-pay

## 2024-05-05 ENCOUNTER — Encounter: Payer: Self-pay | Admitting: Oncology

## 2024-05-05 ENCOUNTER — Encounter: Payer: Self-pay | Admitting: Emergency Medicine

## 2024-05-05 ENCOUNTER — Emergency Department
Admission: EM | Admit: 2024-05-05 | Discharge: 2024-05-05 | Disposition: A | Discharge status: Home / Self Care | Payer: Self-pay | Attending: Emergency Medicine | Admitting: Emergency Medicine

## 2024-05-05 DIAGNOSIS — Y92410 Unspecified street and highway as the place of occurrence of the external cause: Secondary | ICD-10-CM | POA: Insufficient documentation

## 2024-05-05 DIAGNOSIS — S6991XA Unspecified injury of right wrist, hand and finger(s), initial encounter: Secondary | ICD-10-CM | POA: Diagnosis present

## 2024-05-05 MED ORDER — CYCLOBENZAPRINE HCL 10 MG PO TABS: 10.0000 mg | ORAL_TABLET | Freq: Three times a day (TID) | ORAL | 0 refills | Status: AC | PRN

## 2024-05-05 MED ORDER — MELOXICAM 15 MG PO TABS
15.0000 mg | ORAL_TABLET | Freq: Every day | ORAL | 0 refills | Status: AC
Start: 2024-05-05 — End: 2024-05-12

## 2024-05-05 MED ORDER — IBUPROFEN 600 MG PO TABS
600.0000 mg | ORAL_TABLET | Freq: Once | ORAL | Status: AC
  Administered 2024-05-05: 600 mg via ORAL
  Filled 2024-05-05: qty 1
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
## 2024-08-04 ENCOUNTER — Encounter: Payer: Self-pay | Admitting: Oncology

## 2024-08-04 ENCOUNTER — Emergency Department
Admission: EM | Admit: 2024-08-04 | Discharge: 2024-08-04 | Disposition: A | Discharge status: Home / Self Care | Payer: Self-pay | Attending: Emergency Medicine | Admitting: Emergency Medicine

## 2024-08-04 ENCOUNTER — Other Ambulatory Visit: Payer: Self-pay

## 2024-08-04 ENCOUNTER — Encounter: Payer: Self-pay | Admitting: Emergency Medicine

## 2024-08-04 DIAGNOSIS — O4690 Antepartum hemorrhage, unspecified, unspecified trimester: Secondary | ICD-10-CM

## 2024-08-04 DIAGNOSIS — O209 Hemorrhage in early pregnancy, unspecified: Secondary | ICD-10-CM | POA: Insufficient documentation

## 2024-08-04 DIAGNOSIS — Z3A00 Weeks of gestation of pregnancy not specified: Secondary | ICD-10-CM | POA: Insufficient documentation

## 2024-08-04 LAB — CBC
HCT: 36.6 % (ref 36.0–46.0)
Hemoglobin: 10.9 g/dL — ABNORMAL LOW (ref 12.0–15.0)
MCH: 21.1 pg — ABNORMAL LOW (ref 26.0–34.0)
MCHC: 29.8 g/dL — ABNORMAL LOW (ref 30.0–36.0)
MCV: 70.8 fL — ABNORMAL LOW (ref 80.0–100.0)
Platelets: 257 K/uL (ref 150–400)
RBC: 5.17 MIL/uL — ABNORMAL HIGH (ref 3.87–5.11)
RDW: 16 % — ABNORMAL HIGH (ref 11.5–15.5)
WBC: 8.7 K/uL (ref 4.0–10.5)
nRBC: 0 % (ref 0.0–0.2)

## 2024-08-04 LAB — HCG, QUANTITATIVE, PREGNANCY: hCG, Beta Chain, Quant, S: 363 m[IU]/mL — ABNORMAL HIGH

## 2024-08-04 MED ORDER — ONDANSETRON 4 MG PO TBDP
4.0000 mg | ORAL_TABLET | Freq: Once | ORAL | Status: AC
  Administered 2024-08-04: 4 mg via ORAL
  Filled 2024-08-04: qty 1

## 2024-08-04 NOTE — Discharge Instructions (Addendum)
 Follow-up with your regular doctor in 3 to 4 days for repeat beta-hCG.  If you begin to have worsening abdominal pain he can return emergency department. Your beta-hCG today was 363.

## 2024-08-04 NOTE — ED Triage Notes (Signed)
 C/O vaginal spotting x 2 days.  Positive home pregnancy test.  LMP: 07/10/2024.  P4 G3

## 2024-08-04 NOTE — ED Provider Notes (Signed)
 "  [facility] Provider Note    Event Date/Time   First MD Initiated Contact with Patient 08/04/24 [phone]     (approximate)   History   Vaginal Bleeding   HPI  Pamela Cole is a 28 y.o. female G4, P3 presents emergency department complaint of vaginal bleeding.  Patient states her menstrual cycle was supposed to start on Saturday and when it did not she took a pregnancy test that was positive.  Has been having cramping and bleeding/spotting over the last 2 days.  States today she woke up felt a little dizzy, a little nauseated, her nipples are sore.  No fever or chills.  No abdominal pain other than cramping      Physical Exam   Triage Vital Signs: ED Triage Vitals [08/04/24 0949]  Encounter Vitals Group     BP 118/77     Girls Systolic BP Percentile      Girls Diastolic BP Percentile      Boys Systolic BP Percentile      Boys Diastolic BP Percentile      Pulse Rate (!) 111     Resp 16     Temp 98.4 F (36.9 C)     Temp Source Oral     SpO2 99 %     Weight 154 lb 15.7 oz (70.3 kg)     Height      Head Circumference      Peak Flow      Pain Score 5     Pain Loc      Pain Education      Exclude from Growth Chart     Most recent vital signs: Vitals:   08/04/24 0949  BP: 118/77  Pulse: (!) 111  Resp: 16  Temp: 98.4 F (36.9 C)  SpO2: 99%     General: Awake, no distress.   CV:  Good peripheral perfusion. regular rate and  rhythm Resp:  Normal effort. Lungs cta Abd:  No distention.  Nontender Other:     ED Results / Procedures / Treatments   Labs (all labs ordered are listed, but only abnormal results are displayed) Labs Reviewed  CBC - Abnormal; Notable for the following components:      Result Value   RBC 5.17 (*)    Hemoglobin 10.9 (*)    MCV 70.8 (*)    MCH 21.1 (*)    MCHC 29.8 (*)    RDW 16.0 (*)    All other components within normal limits  HCG, QUANTITATIVE, PREGNANCY - Abnormal; Notable for the  following components:   hCG, Beta Chain, Quant, S 363 (*)    All other components within normal limits  ABO/RH     EKG     RADIOLOGY     PROCEDURES:   Procedures  Critical Care:  no Chief Complaint  Patient presents with   Vaginal Bleeding      MEDICATIONS ORDERED IN ED: Medications  ondansetron  (ZOFRAN -ODT) disintegrating tablet 4 mg (4 mg Oral Given 08/04/24 1049)     IMPRESSION / MDM / ASSESSMENT AND PLAN / ED COURSE  I reviewed the triage vital signs and the nursing notes.                              Differential diagnosis includes, but is not limited to, vaginal bleeding in pregnancy, miscarriage, threatened miscarriage, subchorionic Hematoma, ectopic, false positive pregnancy test  Patient's  presentation is most consistent with acute illness / injury with system symptoms.   Medications given: Zofran  4 mg ODT  CBC reassuring, patient has chronic anemia,  Beta-hCG at 363  ABO/Rh still pending, however she has previous type and screen that showed that she was O+ therefore will not wait to give her RhoGAM as it is not needed  Reassurance given to the patient.  She has elevated beta-hCG.  The only way to tell if she is actually miscarrying is to get repeat beta-hCG's at this time.  Explained to her that at this time with a level that is not low and that her LMP was on 07/10/2024 that we would be unable to see anything on an ultrasound.  She is in agreement with this treatment plan.  Strict instructions to return emergency department if worsening abdominal pain.  Discharged in stable condition.  Work note provided      FINAL CLINICAL IMPRESSION(S) / ED DIAGNOSES   Final diagnoses:  Vaginal bleeding in pregnancy     Rx / DC Orders   ED Discharge Orders     None        Note:  This document was prepared using Dragon voice recognition software and may include unintentional dictation errors.    Gasper Devere ORN, PA-C 08/04/24 1052    Arlander Charleston, MD 08/04/24 1215  "

## 2024-09-03 DIAGNOSIS — R109 Unspecified abdominal pain: Secondary | ICD-10-CM | POA: Insufficient documentation

## 2024-09-03 DIAGNOSIS — O26891 Other specified pregnancy related conditions, first trimester: Secondary | ICD-10-CM | POA: Insufficient documentation

## 2024-09-03 DIAGNOSIS — Z5321 Procedure and treatment not carried out due to patient leaving prior to being seen by health care provider: Secondary | ICD-10-CM | POA: Insufficient documentation

## 2024-09-03 DIAGNOSIS — M545 Low back pain, unspecified: Secondary | ICD-10-CM | POA: Insufficient documentation

## 2024-09-04 ENCOUNTER — Emergency Department: Payer: Self-pay

## 2024-09-04 ENCOUNTER — Other Ambulatory Visit: Payer: Self-pay

## 2024-09-04 ENCOUNTER — Emergency Department
Admission: EM | Admit: 2024-09-04 | Discharge: 2024-09-04 | Discharge status: Against Medical Advice | Payer: Self-pay | Attending: Emergency Medicine | Admitting: Emergency Medicine

## 2024-09-04 ENCOUNTER — Telehealth: Payer: Self-pay | Admitting: Emergency Medicine

## 2024-09-04 LAB — URINALYSIS, ROUTINE W REFLEX MICROSCOPIC
Bilirubin Urine: NEGATIVE
Glucose, UA: NEGATIVE mg/dL
Hgb urine dipstick: NEGATIVE
Ketones, ur: NEGATIVE mg/dL
Nitrite: NEGATIVE
Protein, ur: NEGATIVE mg/dL
Specific Gravity, Urine: 1.014 (ref 1.005–1.030)
pH: 8 (ref 5.0–8.0)

## 2024-09-04 LAB — COMPREHENSIVE METABOLIC PANEL WITH GFR
ALT: 21 U/L (ref 0–44)
AST: 23 U/L (ref 15–41)
Albumin: 3.9 g/dL (ref 3.5–5.0)
Alkaline Phosphatase: 64 U/L (ref 38–126)
Anion gap: 10 (ref 5–15)
BUN: 7 mg/dL (ref 6–20)
CO2: 21 mmol/L — ABNORMAL LOW (ref 22–32)
Calcium: 8.5 mg/dL — ABNORMAL LOW (ref 8.9–10.3)
Chloride: 104 mmol/L (ref 98–111)
Creatinine, Ser: 0.46 mg/dL (ref 0.44–1.00)
GFR, Estimated: >60 mL/min
Glucose, Bld: 105 mg/dL — ABNORMAL HIGH (ref 70–99)
Potassium: 3.7 mmol/L (ref 3.5–5.1)
Sodium: 135 mmol/L (ref 135–145)
Total Bilirubin: <0.2 mg/dL (ref 0.0–1.2)
Total Protein: 6.8 g/dL (ref 6.5–8.1)

## 2024-09-04 LAB — CBC
HCT: 34.8 % — ABNORMAL LOW (ref 36.0–46.0)
Hemoglobin: 10.5 g/dL — ABNORMAL LOW (ref 12.0–15.0)
MCH: 21.8 pg — ABNORMAL LOW (ref 26.0–34.0)
MCHC: 30.2 g/dL (ref 30.0–36.0)
MCV: 72.2 fL — ABNORMAL LOW (ref 80.0–100.0)
Platelets: 270 10*3/uL (ref 150–400)
RBC: 4.82 MIL/uL (ref 3.87–5.11)
RDW: 17.9 % — ABNORMAL HIGH (ref 11.5–15.5)
WBC: 12.2 10*3/uL — ABNORMAL HIGH (ref 4.0–10.5)
nRBC: 0 % (ref 0.0–0.2)

## 2024-09-04 LAB — HCG, QUANTITATIVE, PREGNANCY: hCG, Beta Chain, Quant, S: 58851 m[IU]/mL — ABNORMAL HIGH

## 2024-09-04 LAB — LIPASE, BLOOD: Lipase: 39 U/L (ref 11–51)

## 2024-09-04 NOTE — Telephone Encounter (Signed)
 She called me back. She does have a doctor and is starting prenatal care--has appt at 1p at [facility].  I explained that she need to inform the provider that she was here and has labs and ultrasound that need review.  I explained that the us  showed chorionic hemorrhage, and her doctor wil need to monitor that.  She agrees to inform them.  I released the us  in mychart, but she still cannot see it.  I told her to have them call me if the provider is unable to access it in epic.

## 2024-09-04 NOTE — Telephone Encounter (Signed)
 Called patient due to left emergency department before provider exam to inquire about condition and follow up plans. Left message.

## 2024-09-04 NOTE — ED Notes (Signed)
 Pt not found in lobby and/or surrounding areas x 3 attempts

## 2024-09-04 NOTE — ED Triage Notes (Signed)
 Pt reports left side abd pain and lower back pain that began earlier today. Pt is aprox [redacted] weeks pregnant. Denies vaginal bleeding. Pt endorses some nausea. Denies dysuria.
